# Patient Record
Sex: Female | Born: 1935
Health system: Southern US, Community
[De-identification: ages and names within clinical notes are randomized; demographics above are authoritative.]

## PROBLEM LIST (undated history)

## (undated) DIAGNOSIS — F0280 Dementia in other diseases classified elsewhere without behavioral disturbance: Secondary | ICD-10-CM

## (undated) DIAGNOSIS — E785 Hyperlipidemia, unspecified: Secondary | ICD-10-CM

## (undated) DIAGNOSIS — I82409 Acute embolism and thrombosis of unspecified deep veins of unspecified lower extremity: Secondary | ICD-10-CM

## (undated) DIAGNOSIS — I639 Cerebral infarction, unspecified: Secondary | ICD-10-CM

## (undated) DIAGNOSIS — G309 Alzheimer's disease, unspecified: Secondary | ICD-10-CM

## (undated) DIAGNOSIS — I10 Essential (primary) hypertension: Secondary | ICD-10-CM

## (undated) DIAGNOSIS — I2699 Other pulmonary embolism without acute cor pulmonale: Secondary | ICD-10-CM

## (undated) DIAGNOSIS — I4891 Unspecified atrial fibrillation: Secondary | ICD-10-CM

## (undated) DIAGNOSIS — Z950 Presence of cardiac pacemaker: Secondary | ICD-10-CM

## (undated) DIAGNOSIS — N189 Chronic kidney disease, unspecified: Secondary | ICD-10-CM

## (undated) HISTORY — DX: Hyperlipidemia, unspecified: E78.5

## (undated) HISTORY — DX: Other pulmonary embolism without acute cor pulmonale: I26.99

## (undated) HISTORY — DX: Alzheimer's disease, unspecified: G30.9

## (undated) HISTORY — DX: Essential (primary) hypertension: I10

## (undated) HISTORY — PX: CATARACT EXTRACTION, BILATERAL: SHX1313

## (undated) HISTORY — DX: Dementia in other diseases classified elsewhere, unspecified severity, without behavioral disturbance, psychotic disturbance, mood disturbance, and anxiety: F02.80

## (undated) HISTORY — DX: Presence of cardiac pacemaker: Z95.0

## (undated) HISTORY — DX: Cerebral infarction, unspecified: I63.9

## (undated) HISTORY — DX: Chronic kidney disease, unspecified: N18.9

## (undated) HISTORY — DX: Unspecified atrial fibrillation: I48.91

---
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
## 2011-01-19 DIAGNOSIS — I82409 Acute embolism and thrombosis of unspecified deep veins of unspecified lower extremity: Secondary | ICD-10-CM

## 2011-01-19 HISTORY — DX: Acute embolism and thrombosis of unspecified deep veins of unspecified lower extremity: I82.409

## 2011-01-19 HISTORY — PX: PACEMAKER IMPLANT: EP1218

## 2017-05-26 DIAGNOSIS — F0280 Dementia in other diseases classified elsewhere without behavioral disturbance: Secondary | ICD-10-CM | POA: Diagnosis not present

## 2017-05-26 DIAGNOSIS — E782 Mixed hyperlipidemia: Secondary | ICD-10-CM | POA: Diagnosis not present

## 2017-05-26 DIAGNOSIS — G301 Alzheimer's disease with late onset: Secondary | ICD-10-CM | POA: Diagnosis not present

## 2017-05-26 DIAGNOSIS — Z7901 Long term (current) use of anticoagulants: Secondary | ICD-10-CM | POA: Diagnosis not present

## 2017-06-01 ENCOUNTER — Emergency Department (HOSPITAL_COMMUNITY)
Admission: EM | Admit: 2017-06-01 | Discharge: 2017-06-01 | Disposition: A | Discharge status: Home / Self Care | Payer: Medicare Other | Attending: Emergency Medicine | Admitting: Emergency Medicine

## 2017-06-01 ENCOUNTER — Emergency Department (HOSPITAL_COMMUNITY): Payer: Medicare Other

## 2017-06-01 ENCOUNTER — Encounter (HOSPITAL_COMMUNITY): Payer: Self-pay | Admitting: Emergency Medicine

## 2017-06-01 DIAGNOSIS — Z79899 Other long term (current) drug therapy: Secondary | ICD-10-CM | POA: Diagnosis not present

## 2017-06-01 DIAGNOSIS — K5900 Constipation, unspecified: Secondary | ICD-10-CM | POA: Diagnosis not present

## 2017-06-01 DIAGNOSIS — F0390 Unspecified dementia without behavioral disturbance: Secondary | ICD-10-CM | POA: Diagnosis not present

## 2017-06-01 DIAGNOSIS — N19 Unspecified kidney failure: Secondary | ICD-10-CM | POA: Diagnosis not present

## 2017-06-01 DIAGNOSIS — R1084 Generalized abdominal pain: Secondary | ICD-10-CM

## 2017-06-01 DIAGNOSIS — Z7901 Long term (current) use of anticoagulants: Secondary | ICD-10-CM | POA: Insufficient documentation

## 2017-06-01 LAB — COMPREHENSIVE METABOLIC PANEL
ALBUMIN: 4.2 g/dL (ref 3.5–5.0)
ALT: 17 U/L (ref 14–54)
AST: 23 U/L (ref 15–41)
Alkaline Phosphatase: 54 U/L (ref 38–126)
Anion gap: 9 (ref 5–15)
BILIRUBIN TOTAL: 0.6 mg/dL (ref 0.3–1.2)
BUN: 22 mg/dL — ABNORMAL HIGH (ref 6–20)
CO2: 25 mmol/L (ref 22–32)
Calcium: 9.9 mg/dL (ref 8.9–10.3)
Chloride: 106 mmol/L (ref 101–111)
Creatinine, Ser: 2.3 mg/dL — ABNORMAL HIGH (ref 0.44–1.00)
GFR calc Af Amer: 21 mL/min — ABNORMAL LOW (ref >60)
GFR calc non Af Amer: 18 mL/min — ABNORMAL LOW (ref >60)
GLUCOSE: 103 mg/dL — AB (ref 65–99)
Potassium: 4.7 mmol/L (ref 3.5–5.1)
Sodium: 140 mmol/L (ref 135–145)
TOTAL PROTEIN: 8.1 g/dL (ref 6.5–8.1)

## 2017-06-01 LAB — CBC
HCT: 40.1 % (ref 36.0–46.0)
HEMOGLOBIN: 13.5 g/dL (ref 12.0–15.0)
MCH: 30.8 pg (ref 26.0–34.0)
MCHC: 33.7 g/dL (ref 30.0–36.0)
MCV: 91.3 fL (ref 78.0–100.0)
Platelets: 266 10*3/uL (ref 150–400)
RBC: 4.39 MIL/uL (ref 3.87–5.11)
RDW: 15 % (ref 11.5–15.5)
WBC: 4.5 10*3/uL (ref 4.0–10.5)

## 2017-06-01 LAB — URINALYSIS, ROUTINE W REFLEX MICROSCOPIC
BILIRUBIN URINE: NEGATIVE
Glucose, UA: NEGATIVE mg/dL
HGB URINE DIPSTICK: NEGATIVE
Ketones, ur: NEGATIVE mg/dL
LEUKOCYTES UA: NEGATIVE
NITRITE: NEGATIVE
Protein, ur: NEGATIVE mg/dL
SPECIFIC GRAVITY, URINE: 1.011 (ref 1.005–1.030)
pH: 5 (ref 5.0–8.0)

## 2017-06-01 LAB — LIPASE, BLOOD: Lipase: 72 U/L — ABNORMAL HIGH (ref 11–51)

## 2017-06-01 MED ORDER — SODIUM CHLORIDE 0.9 % IV SOLN
INTRAVENOUS | Status: DC
  Administered 2017-06-01: 15:00:00 via INTRAVENOUS

## 2017-06-01 NOTE — ED Notes (Signed)
Patient c/o of abdominal pain for about a week now. Patient denies n/v and diarrhea. Patient has seen her primary care provider who thinks she is constipation and has done a rectal examine and did not see any stool.

## 2017-06-01 NOTE — Discharge Instructions (Addendum)
Extensive work-up here today shows some evidence of some mild constipation.  Continue the MiraLAX.  Can take it twice a day as needed.  Labs provided from today's ED visit.  Call and make an appointment to follow-up with your primary care doctor at [facility].  Also call and make an appointment to follow-up with nephrology.  Today's work-up showed that you have evidence of renal failure whether this is new or old is not known.  Will need close follow-up for this.  No evidence of elevated potassium or fluid overload that would require admission today.  But close follow-up is necessary.

## 2017-06-01 NOTE — ED Notes (Signed)
Took patient to the restroom using a steady device. Patient was able to have a small hard bowel movement but unable to void yet. Patient is now receiving fluids IV and will try again in 20 min.

## 2017-06-01 NOTE — ED Notes (Signed)
Patient aware we need urine sample. Patient has already urinated not too long ago stated by patients family. Patient will let us know when she needs to void. Patient is 1 assist or can use steady to the restroom.

## 2017-06-01 NOTE — ED Provider Notes (Signed)
[facility] DEPT Provider Note   CSN: [id number] Arrival date & time: 06/01/17  1308     History   Chief Complaint Chief Complaint  Patient presents with  . Constipation  . Abdominal Pain    HPI Hailey Lopez is a 84 y.o. female.  Patient sent in from primary care office.  Patient recently moved here with family members from [place].  Patient followed by [facility] physicians had a visit about a week ago concerns were clinically for constipation patient started on MiraLAX and some other medication for it.  Patient return for follow-up of that today.  Still having abdominal pain so referred in for CT abdomen to make sure that there was nothing significant going on.  Patient has not had any labs since she has been here in [place].  Patient known to have a history of dementia.  Family denies fevers or nausea or vomiting.  Patient just complains of crampy abdominal pain.  Appetite is been good.     History reviewed. No pertinent past medical history.  There are no active problems to display for this patient.   History reviewed. No pertinent surgical history.   OB History   None      Home Medications    Prior to Admission medications   Medication Sig Start Date End Date Taking? Authorizing Provider  atorvastatin (LIPITOR) 10 MG tablet Take 10 mg by mouth daily.   Yes [provider]  calcitRIOL (ROCALTROL) 0.5 MCG capsule Take 0.5 mcg by mouth daily.   Yes [provider]  docusate sodium (COLACE) 100 MG capsule Take 100 mg by mouth daily.   Yes [provider]  donepezil (ARICEPT ODT) 5 MG disintegrating tablet Take 5 mg by mouth at bedtime.   Yes [provider]  isosorbide dinitrate (ISORDIL) 5 MG tablet Take 5 mg by mouth daily with lunch.   Yes [provider]  MELATONIN PO Take 2 mLs by mouth at bedtime.   Yes [provider]  polyethylene glycol (MIRALAX / GLYCOLAX) packet Take 17 g  by mouth daily.   Yes [provider]  sennosides-docusate sodium (SENOKOT-S) 8.6-50 MG tablet Take 1 tablet by mouth at bedtime.   Yes [provider]  warfarin (COUMADIN) 3 MG tablet Take 3 mg by mouth daily.   Yes [provider]    Family History No family history on file.  Social History Social History   Tobacco Use  . Smoking status: Never Smoker  . Smokeless tobacco: Never Used  Substance Use Topics  . Alcohol use: Not on file  . Drug use: Not on file     Allergies   Patient has no known allergies.   Review of Systems Review of Systems  Unable to perform ROS: Dementia     Physical Exam Updated Vital Signs BP 136/82 (BP Location: Left Arm)   Pulse 60   Temp 97.6 F (36.4 C) (Oral)   Resp 19   SpO2 100%   Physical Exam  Constitutional: She appears well-developed and well-nourished. No distress.  HENT:  Head: Normocephalic and atraumatic.  Eyes: Pupils are equal, round, and reactive to light. Conjunctivae and EOM are normal.  Neck: Neck supple.  Cardiovascular: Normal rate, regular rhythm and normal heart sounds.  Pulmonary/Chest: Effort normal and breath sounds normal. No respiratory distress.  Abdominal: Soft. Bowel sounds are normal. She exhibits distension. There is no tenderness.  Musculoskeletal: Normal range of motion.  Neurological: She is alert. No  cranial nerve deficit. She exhibits normal muscle tone.  Skin: Skin is warm.  Nursing note and vitals reviewed.    ED Treatments / Results  Labs (all labs ordered are listed, but only abnormal results are displayed) Labs Reviewed  LIPASE, BLOOD - Abnormal; Notable for the following components:      Result Value   Lipase 72 (*)    All other components within normal limits  COMPREHENSIVE METABOLIC PANEL - Abnormal; Notable for the following components:   Glucose, Bld 103 (*)    BUN 22 (*)    Creatinine, Ser 2.30 (*)    GFR calc non Af Amer 18 (*)    GFR calc Af Amer  21 (*)    All other components within normal limits  CBC  URINALYSIS, ROUTINE W REFLEX MICROSCOPIC    EKG None  Radiology No results found.  Procedures Procedures (including critical care time)  Medications Ordered in ED Medications  0.9 %  sodium chloride infusion (has no administration in time range)     Initial Impression / Assessment and Plan / ED Course  I have reviewed the triage vital signs and the nursing notes.  Pertinent labs & imaging results that were available during my care of the patient were reviewed by me and considered in my medical decision making (see chart for details).    Patient work-up here CT scan of the abdomen shows mild to moderate constipation no significant abdominal findings.  Patient can continue MiraLAX for this.  Lab work-up shows evidence of significant renal failure but a normal potassium no evidence of any volume overload room air sats are in the upper 90s and CT scan cut to the lower part of the chest shows no significant volume overload.  Is unknown the chronicity of this.  Since patient has not had any labs in our system or since she is been moved here since [place].  Patient will need close follow-up with primary care doctor to recheck labs also patient given referral to nephrology.  For the constipation recommend only continuing the MiraLAX can take it twice a day.   Final Clinical Impressions(s) / ED Diagnoses   Final diagnoses:  None    ED Discharge Orders    None       Fredia Sorrow, MD 06/01/17 1736

## 2017-06-01 NOTE — ED Triage Notes (Signed)
Family reports that she has been having issues with constipation and PCP put her on new medications along with Miralax but still not helping. Patient c/o abd pains.

## 2017-06-10 DIAGNOSIS — N184 Chronic kidney disease, stage 4 (severe): Secondary | ICD-10-CM | POA: Diagnosis not present

## 2017-06-10 DIAGNOSIS — Z86711 Personal history of pulmonary embolism: Secondary | ICD-10-CM | POA: Diagnosis not present

## 2017-06-10 DIAGNOSIS — Z8673 Personal history of transient ischemic attack (TIA), and cerebral infarction without residual deficits: Secondary | ICD-10-CM | POA: Diagnosis not present

## 2017-06-10 DIAGNOSIS — K5901 Slow transit constipation: Secondary | ICD-10-CM | POA: Diagnosis not present

## 2017-07-08 DIAGNOSIS — R309 Painful micturition, unspecified: Secondary | ICD-10-CM | POA: Diagnosis not present

## 2017-07-08 DIAGNOSIS — I10 Essential (primary) hypertension: Secondary | ICD-10-CM | POA: Diagnosis not present

## 2017-07-08 DIAGNOSIS — Z7901 Long term (current) use of anticoagulants: Secondary | ICD-10-CM | POA: Diagnosis not present

## 2017-07-08 DIAGNOSIS — F0280 Dementia in other diseases classified elsewhere without behavioral disturbance: Secondary | ICD-10-CM | POA: Diagnosis not present

## 2017-07-25 DIAGNOSIS — I129 Hypertensive chronic kidney disease with stage 1 through stage 4 chronic kidney disease, or unspecified chronic kidney disease: Secondary | ICD-10-CM | POA: Diagnosis not present

## 2017-07-25 DIAGNOSIS — N184 Chronic kidney disease, stage 4 (severe): Secondary | ICD-10-CM | POA: Diagnosis not present

## 2017-07-25 DIAGNOSIS — Z86711 Personal history of pulmonary embolism: Secondary | ICD-10-CM | POA: Diagnosis not present

## 2017-08-25 DIAGNOSIS — Z7901 Long term (current) use of anticoagulants: Secondary | ICD-10-CM | POA: Diagnosis not present

## 2017-09-13 DIAGNOSIS — R1032 Left lower quadrant pain: Secondary | ICD-10-CM | POA: Diagnosis not present

## 2017-09-23 ENCOUNTER — Encounter: Payer: Self-pay | Admitting: *Deleted

## 2017-09-26 DIAGNOSIS — Z7901 Long term (current) use of anticoagulants: Secondary | ICD-10-CM | POA: Diagnosis not present

## 2017-09-28 DIAGNOSIS — Z23 Encounter for immunization: Secondary | ICD-10-CM | POA: Diagnosis not present

## 2017-09-28 DIAGNOSIS — R1032 Left lower quadrant pain: Secondary | ICD-10-CM | POA: Diagnosis not present

## 2017-09-28 DIAGNOSIS — Z0000 Encounter for general adult medical examination without abnormal findings: Secondary | ICD-10-CM | POA: Diagnosis not present

## 2017-09-28 DIAGNOSIS — Z7901 Long term (current) use of anticoagulants: Secondary | ICD-10-CM | POA: Diagnosis not present

## 2017-09-29 ENCOUNTER — Encounter: Payer: Self-pay | Admitting: Cardiovascular Disease

## 2017-09-29 ENCOUNTER — Ambulatory Visit (INDEPENDENT_AMBULATORY_CARE_PROVIDER_SITE_OTHER): Payer: Medicare Other | Admitting: Cardiovascular Disease

## 2017-09-29 VITALS — BP 110/62 | HR 60 | Ht 63.0 in | Wt 153.0 lb

## 2017-09-29 DIAGNOSIS — I10 Essential (primary) hypertension: Secondary | ICD-10-CM

## 2017-09-29 DIAGNOSIS — E785 Hyperlipidemia, unspecified: Secondary | ICD-10-CM

## 2017-09-29 DIAGNOSIS — I495 Sick sinus syndrome: Secondary | ICD-10-CM

## 2017-09-29 NOTE — Progress Notes (Signed)
Cardiology Office Note   Date:  09/29/2017   ID:  Hailey, Lopez Nov 30, 1929, MRN [id number]  PCP:  Dineen Kid, MD  Cardiologist:   Skeet Latch, MD   No chief complaint on file.     History of Present Illness: Hailey Lopez is a 84 y.o. female with hypertension, hyperlipidemia, prior stroke, CKD IV, dementia, PPM here to establish care.  Hailey Lopez moved to [facility] from [place], [place].  She established care with Dr. Lennette Bihari Via 06/2017 and was referred to cardiology to establish care.  She was initially supposed to move with her sister and her husband.  However they are both also elderly and were unable to care for her.  Therefore she now lives with her niece who is working on becoming her legal guardian.  [name] has been feeling well.  Her niece encourages her to try and walk at least a little bit each day.  She is mostly limited by instability in her left knee that sometimes gives out.  She has no exertional dyspnea or chest pain.  She has no lower extremity edema but sometimes feels short of breath when lying down.  She reportedly had a Environmental health practitioner pacemaker implanted in 2013.  She thinks that the pacemaker was implanted for "fainting spells."  When she first moved here they plugged up her transmitter to the wall but did not realize there is no phone jack in that room and it has not been transmitting.  Little is known about her prior cardiac history as neither she nor her niece know her prior cardiologist name or address.  She denies any history of heart failure or CAD.  Hailey Lopez has been on warfarin but there is no clear explanation as to why.  She has no history of DVT or atrial fibrillation that we know of.  Her PCP has been managing her warfarin and reportedly got some records from her prior caregivers.   Past Medical History:  Diagnosis Date  . Alzheimer's disease   . Chronic kidney disease    Stage 4  . CVA (cerebrovascular accident) ([place])   . Hyperlipidemia   .  Hypertension   . Pacemaker   . Pulmonary embolism ([place])     History reviewed. No pertinent surgical history.   Current Outpatient Medications  Medication Sig Dispense Refill  . atorvastatin (LIPITOR) 10 MG tablet Take 10 mg by mouth daily.    . calcitRIOL (ROCALTROL) 0.5 MCG capsule Take 0.5 mcg by mouth daily.    Marland Kitchen donepezil (ARICEPT ODT) 5 MG disintegrating tablet Take 5 mg by mouth at bedtime.    . isosorbide dinitrate (ISORDIL) 5 MG tablet Take 5 mg by mouth daily with lunch.    . polyethylene glycol (MIRALAX / GLYCOLAX) packet Take 17 g by mouth daily.    Marland Kitchen warfarin (COUMADIN) 3 MG tablet Take 3 mg by mouth daily.     No current facility-administered medications for this visit.     Allergies:   Patient has no known allergies.    Social History:  The patient  reports that she has quit smoking. She has never used smokeless tobacco. She reports that she drank alcohol. She reports that she does not use drugs.   Family History:  The patient's family history includes Dementia in her mother and sister; Diabetes in her brother; Heart attack in her brother, brother, father, and sister; Hyperlipidemia in her sister; Hypertension in her brother; Stroke in her brother and father.  ROS:  Please see the history of present illness.   Otherwise, review of systems are positive for none.   All other systems are reviewed and negative.    PHYSICAL EXAM: VS:  BP 110/62   Pulse 60   Ht 5\' 3"  (1.6 m)   Wt 153 lb (69.4 kg)   SpO2 97%   BMI 27.10 kg/m  , BMI Body mass index is 27.1 kg/m. GENERAL:  Well appearing.  Able to answer simple questions.  HEENT:  Pupils equal round and reactive, fundi not visualized, oral mucosa unremarkable NECK:  No jugular venous distention, waveform within normal limits, carotid upstroke brisk and symmetric, no bruits LUNGS:  Clear to auscultation bilaterally HEART:  RRR.  PMI not displaced or sustained,S1 and S2 within normal limits, no S3, no S4, no clicks,  no rubs, no murmurs ABD:  Flat, positive bowel sounds normal in frequency in pitch, no bruits, no rebound, no guarding, no midline pulsatile mass, no hepatomegaly, no splenomegaly EXT:  2 plus pulses throughout, no edema, no cyanosis no clubbing SKIN:  No rashes no nodules NEURO:  Cranial nerves II through XII grossly intact, motor grossly intact throughout PSYCH: Oriented to person   EKG:  EKG is not ordered today. The ekg ordered today demonstrates APVS.  Rate 60 bpm.  RBBB.     Recent Labs: 06/01/2017: ALT 17; BUN 22; Creatinine, Ser 2.30; Hemoglobin 13.5; Platelets 266; Potassium 4.7; Sodium 140   09/13/2017:  Sodium 139, potassium 4.2, BUN 41, creatinine 2.39 AST 17, ALT 13 WBC 5.6, hemoglobin 13.1, hematocrit 39.3, platelets 248  Lipid Panel No results found for: CHOL, TRIG, HDL, CHOLHDL, VLDL, LDLCALC, LDLDIRECT    Wt Readings from Last 3 Encounters:  09/29/17 153 lb (69.4 kg)      ASSESSMENT AND PLAN:  # PPM: St. Jude PPM presumably implanted for SSS.  Functioning properly on EKG.  We will refer her to Dr. Sallyanne Kuster for management.  # Hypertension: BP well-controlled.  Continue Isordil.  # Hyperlipidemia: Continue atorvastatin. She will return for fasting lipids.    # Chronic anticoagulation: Her indication is unclear.  Her PCP reportedly has records from her cardiologist.  We will try to obtain these records.     Current medicines are reviewed at length with the patient today.  The patient does not have concerns regarding medicines.  The following changes have been made:  no change  Labs/ tests ordered today include:   Orders Placed This Encounter  Procedures  . Lipid panel  . EKG 12-Lead     Disposition:   FU with Belinda Bringhurst C. Oval Linsey, MD, [facility] in 6 months.  If records are obtained and she only has SSS s/p PPM without any other active cardiac issues it may make sense for her to just see Dr. Loletha Grayer.      Signed, Shanen Norris C. Oval Linsey, MD, [facility]  09/29/2017 4:57  PM    [place]

## 2017-09-29 NOTE — Patient Instructions (Addendum)
Medication Instructions:  Your physician recommends that you continue on your current medications as directed. Please refer to the Current Medication list given to you today.  Labwork: FASTING LIPID TOMORROW WITH YOUR PRIMARY CARE   Testing/Procedures: NONE  Follow-Up: Your physician recommends that you schedule a follow-up appointment in: DR CROITORU FOR PACEMAKER  FOLLOW UP AS NEEDED WITH DR [facility]   If you need a refill on your cardiac medications before your next appointment, please call your pharmacy.

## 2017-09-30 ENCOUNTER — Other Ambulatory Visit: Payer: Self-pay | Admitting: Family Medicine

## 2017-09-30 DIAGNOSIS — Z7901 Long term (current) use of anticoagulants: Secondary | ICD-10-CM | POA: Diagnosis not present

## 2017-09-30 DIAGNOSIS — E782 Mixed hyperlipidemia: Secondary | ICD-10-CM | POA: Diagnosis not present

## 2017-09-30 DIAGNOSIS — R748 Abnormal levels of other serum enzymes: Secondary | ICD-10-CM

## 2017-09-30 DIAGNOSIS — R1032 Left lower quadrant pain: Secondary | ICD-10-CM

## 2017-10-07 ENCOUNTER — Ambulatory Visit
Admission: RE | Admit: 2017-10-07 | Discharge: 2017-10-07 | Disposition: A | Discharge status: Home / Self Care | Payer: Medicare Other | Source: Ambulatory Visit | Attending: Family Medicine | Admitting: Family Medicine

## 2017-10-07 DIAGNOSIS — K5730 Diverticulosis of large intestine without perforation or abscess without bleeding: Secondary | ICD-10-CM | POA: Diagnosis not present

## 2017-10-07 DIAGNOSIS — R1032 Left lower quadrant pain: Secondary | ICD-10-CM

## 2017-10-07 DIAGNOSIS — R748 Abnormal levels of other serum enzymes: Secondary | ICD-10-CM

## 2017-10-25 DIAGNOSIS — Z7901 Long term (current) use of anticoagulants: Secondary | ICD-10-CM | POA: Diagnosis not present

## 2017-11-22 DIAGNOSIS — Z7901 Long term (current) use of anticoagulants: Secondary | ICD-10-CM | POA: Diagnosis not present

## 2017-11-23 ENCOUNTER — Ambulatory Visit (INDEPENDENT_AMBULATORY_CARE_PROVIDER_SITE_OTHER): Payer: Medicare Other | Admitting: Cardiovascular Disease

## 2017-11-23 ENCOUNTER — Encounter: Payer: Self-pay | Admitting: Cardiovascular Disease

## 2017-11-23 VITALS — BP 126/72 | HR 69 | Ht 63.0 in | Wt 153.4 lb

## 2017-11-23 DIAGNOSIS — N184 Chronic kidney disease, stage 4 (severe): Secondary | ICD-10-CM | POA: Diagnosis not present

## 2017-11-23 DIAGNOSIS — I480 Paroxysmal atrial fibrillation: Secondary | ICD-10-CM

## 2017-11-23 DIAGNOSIS — E7800 Pure hypercholesterolemia, unspecified: Secondary | ICD-10-CM

## 2017-11-23 DIAGNOSIS — I10 Essential (primary) hypertension: Secondary | ICD-10-CM | POA: Diagnosis not present

## 2017-11-23 DIAGNOSIS — I495 Sick sinus syndrome: Secondary | ICD-10-CM | POA: Diagnosis not present

## 2017-11-23 DIAGNOSIS — Z950 Presence of cardiac pacemaker: Secondary | ICD-10-CM | POA: Diagnosis not present

## 2017-11-23 DIAGNOSIS — Z86711 Personal history of pulmonary embolism: Secondary | ICD-10-CM | POA: Diagnosis not present

## 2017-11-23 NOTE — Progress Notes (Signed)
Cardiology Office Note:    Date:  11/25/2017   ID:  Hailey Lopez, DOB Jan 11, 1930, MRN [id number]  PCP:  Hailey Kid, MD  Cardiologist:  Joseangel Nettleton Electrophysiologist:  None   Referring MD: Hailey Kid, MD   Chief Complaint  Patient presents with  . Pacemaker Check    History of Present Illness:    Hailey Lopez is a 84 y.o. female with a hx of sick sinus syndrome and paroxysmal atrial fibrillation status post dual-chamber permanent pacemaker Hydrologist DR RF implanted 2013 in [place]), recently established cardiology follow-up in [place].  She moved here to live with her niece Hailey Lopez, after developing worsening dementia.  She has a previous history of stroke and pulmonary embolism although the details are uncertain.  She has advanced stage chronic kidney disease, hypertension hypercholesterolemia.  She saw Dr. Oval Lopez recently, but is here to establish pacemaker follow-up.  Pacemaker interrogation shows normal device function.  Estimated generator longevity is 6.8-7.0 years.  She is not device dependent.  Lead parameters are all excellent.  She has 75% atrial pacing and only 21% ventricular pacing.  She had several episodes of atrial fibrillation but they all occurred in the first 2 weeks of October 2019, with the remainder of the year being completely free of meaningful arrhythmia.  The longest episode lasted for about 6 hours but the burden is only less than 1%.  Her niece states that the arrhythmia occurred during the period of time when the patient had a temporary caregiver and was more restless.  No pacemaker programming changes are necessary.  She is on chronic anticoagulation with warfarin and has not had recent falls, injuries or serious bleeding.  Past Medical History:  Diagnosis Date  . Alzheimer's disease ([place])   . Chronic kidney disease    Stage 4  . CVA (cerebrovascular accident) ([place])   . Hyperlipidemia   . Hypertension   . Pacemaker   .  Pulmonary embolism ([place])     History reviewed. No pertinent surgical history.  Current Medications: Current Meds  Medication Sig  . atorvastatin (LIPITOR) 10 MG tablet Take 10 mg by mouth daily.  . calcitRIOL (ROCALTROL) 0.5 MCG capsule Take 0.5 mcg by mouth daily.  Marland Kitchen donepezil (ARICEPT ODT) 5 MG disintegrating tablet Take 5 mg by mouth at bedtime.  . isosorbide dinitrate (ISORDIL) 5 MG tablet Take 5 mg by mouth daily with lunch.  . polyethylene glycol (MIRALAX / GLYCOLAX) packet Take 17 g by mouth daily.  Marland Kitchen warfarin (COUMADIN) 3 MG tablet Take 3 mg by mouth daily.     Allergies:   Patient has no known allergies.   Social History   Socioeconomic History  . Marital status: Widowed    Spouse name: Not on file  . Number of children: Not on file  . Years of education: Not on file  . Highest education level: Not on file  Occupational History  . Not on file  Social Needs  . Financial resource strain: Not on file  . Food insecurity:    Worry: Not on file    Inability: Not on file  . Transportation needs:    Medical: Not on file    Non-medical: Not on file  Tobacco Use  . Smoking status: Former Research scientist (life sciences)  . Smokeless tobacco: Never Used  Substance and Sexual Activity  . Alcohol use: Not Currently  . Drug use: Never  . Sexual activity: Not on file  Lifestyle  . Physical activity:    Days  per week: Not on file    Minutes per session: Not on file  . Stress: Not on file  Relationships  . Social connections:    Talks on phone: Not on file    Gets together: Not on file    Attends religious service: Not on file    Active member of club or organization: Not on file    Attends meetings of clubs or organizations: Not on file    Relationship status: Not on file  Other Topics Concern  . Not on file  Social History Narrative  . Not on file     Family History: The patient's family history includes Dementia in her mother and sister; Diabetes in her brother; Heart attack in her  brother, brother, father, and sister; Hyperlipidemia in her sister; Hypertension in her brother; Stroke in her brother and father.  ROS:   Please see the history of present illness.     All other systems reviewed and are negative.  EKGs/Labs/Other Studies Reviewed:    The following studies were reviewed today: Notes from Dr. Oval Lopez  EKG:  EKG is not ordered today.    Recent Labs: 06/01/2017: ALT 17; BUN 22; Creatinine, Ser 2.30; Hemoglobin 13.5; Platelets 266; Potassium 4.7; Sodium 140  Recent Lipid Panel No results found for: CHOL, TRIG, HDL, CHOLHDL, VLDL, LDLCALC, LDLDIRECT  Physical Exam:    VS:  BP 126/72   Pulse 69   Ht 5\' 3"  (1.6 m)   Wt 153 lb 6.4 oz (69.6 kg)   BMI 27.17 kg/m     Wt Readings from Last 3 Encounters:  11/23/17 153 lb 6.4 oz (69.6 kg)  09/29/17 153 lb (69.4 kg)     GEN: She is very quiet.  Well nourished, well developed in no acute distress HEENT: Normal NECK: No JVD; No carotid bruits LYMPHATICS: No lymphadenopathy CARDIAC: RRR, no murmurs, rubs, gallops.  Well-healed left subclavian pacemaker site RESPIRATORY:  Clear to auscultation without rales, wheezing or rhonchi  ABDOMEN: Soft, non-tender, non-distended MUSCULOSKELETAL:  No edema; No deformity  SKIN: Warm and dry NEUROLOGIC:  Alert and oriented x 3 PSYCHIATRIC: Very quiet and a little apprehensive.  Speaks in single word sentences.  Did smile a little bit towards the end of our appointment.  ASSESSMENT:    1. SSS (sick sinus syndrome) (H[facility])   2. Paroxysmal atrial fibrillation ([place])   3. Pacemaker   4. History of pulmonary embolism   5. CKD (chronic kidney disease) stage 4, GFR 15-29 ml/min (H[facility])   6. Essential hypertension   7. Hypercholesterolemia    PLAN:    In order of problems listed above:  1. SSS: Not device dependent but requires roughly 75% atrial pacing.  Considering her level of activity the heart rate histograms appear to be appropriate. 2. AFib: The overall burden  of arrhythmia is very low.  Rate control was satisfactory.  The episodes were probably triggered by anxiety and were not symptomatic.  She is fully anticoagulated. 3. PPM: We will enroll in our remote pacemaker follow-up clinic.  Downloads every 3 months. 4. History of pulmonary embolism: On warfarin for this as well. 5. CKD 4: Reportedly stable parameters of renal function. 6. HTN: Well-controlled 7. HLP: On atorvastatin.  She is due to have a repeat lipid profile   Medication Adjustments/Labs and Tests Ordered: Current medicines are reviewed at length with the patient today.  Concerns regarding medicines are outlined above.  Orders Placed This Encounter  Procedures  . Pacemaker external -  parameters   No orders of the defined types were placed in this encounter.   Patient Instructions  Medication Instructions:  Dr Sallyanne Kuster recommends that you continue on your current medications as directed. Please refer to the Current Medication list given to you today.  If you need a refill on your cardiac medications before your next appointment, please call your pharmacy.   Testing/Procedures: Remote monitoring is used to monitor your Pacemaker of ICD from home. This monitoring reduces the number of office visits required to check your device to one time per year. It allows [place] to keep an eye on the functioning of your device to ensure it is working properly. You are scheduled for a device check from home on Wednesday, Februaury 5th, 2020. You may send your transmission at any time that day. If you have a wireless device, the transmission will be sent automatically. After your physician reviews your transmission, you will receive a postcard with your next transmission date.  Follow-Up: At [facility], you and your health needs are our priority.  As part of our continuing mission to provide you with exceptional heart care, we have created designated Provider Care Teams.  These Care Teams include  your primary Cardiologist (physician) and Advanced Practice Providers (APPs -  Physician Assistants and Nurse Practitioners) who all work together to provide you with the care you need, when you need it. You will need a follow up appointment in 12 months.  Please call our office 2 months in advance to schedule this appointment.  You may see Sanda Klein, MD or one of the following Advanced Practice Providers on your designated Care Team: [place], [place] . Fabian Sharp, PA-C    Signed, Sanda Klein, MD  11/25/2017 4:00 PM    [place]

## 2017-11-23 NOTE — Patient Instructions (Signed)
Medication Instructions:  Dr Sallyanne Kuster recommends that you continue on your current medications as directed. Please refer to the Current Medication list given to you today.  If you need a refill on your cardiac medications before your next appointment, please call your pharmacy.   Testing/Procedures: Remote monitoring is used to monitor your Pacemaker of ICD from home. This monitoring reduces the number of office visits required to check your device to one time per year. It allows [place] to keep an eye on the functioning of your device to ensure it is working properly. You are scheduled for a device check from home on Wednesday, Februaury 5th, 2020. You may send your transmission at any time that day. If you have a wireless device, the transmission will be sent automatically. After your physician reviews your transmission, you will receive a postcard with your next transmission date.  Follow-Up: At [facility], you and your health needs are our priority.  As part of our continuing mission to provide you with exceptional heart care, we have created designated Provider Care Teams.  These Care Teams include your primary Cardiologist (physician) and Advanced Practice Providers (APPs -  Physician Assistants and Nurse Practitioners) who all work together to provide you with the care you need, when you need it. You will need a follow up appointment in 12 months.  Please call our office 2 months in advance to schedule this appointment.  You may see Sanda Klein, MD or one of the following Advanced Practice Providers on your designated Care Team: [place], [place] . Fabian Sharp, PA-C

## 2017-11-25 ENCOUNTER — Ambulatory Visit (INDEPENDENT_AMBULATORY_CARE_PROVIDER_SITE_OTHER): Payer: Medicare Other | Admitting: Podiatry

## 2017-11-25 ENCOUNTER — Encounter: Payer: Self-pay | Admitting: Cardiovascular Disease

## 2017-11-25 DIAGNOSIS — I480 Paroxysmal atrial fibrillation: Secondary | ICD-10-CM | POA: Insufficient documentation

## 2017-11-25 DIAGNOSIS — M79674 Pain in right toe(s): Secondary | ICD-10-CM

## 2017-11-25 DIAGNOSIS — B351 Tinea unguium: Secondary | ICD-10-CM | POA: Diagnosis not present

## 2017-11-25 DIAGNOSIS — N184 Chronic kidney disease, stage 4 (severe): Secondary | ICD-10-CM | POA: Insufficient documentation

## 2017-11-25 DIAGNOSIS — Z86711 Personal history of pulmonary embolism: Secondary | ICD-10-CM | POA: Insufficient documentation

## 2017-11-25 DIAGNOSIS — M79675 Pain in left toe(s): Secondary | ICD-10-CM | POA: Diagnosis not present

## 2017-11-25 DIAGNOSIS — Z950 Presence of cardiac pacemaker: Secondary | ICD-10-CM | POA: Insufficient documentation

## 2017-11-25 DIAGNOSIS — E7800 Pure hypercholesterolemia, unspecified: Secondary | ICD-10-CM | POA: Insufficient documentation

## 2017-11-25 DIAGNOSIS — I10 Essential (primary) hypertension: Secondary | ICD-10-CM | POA: Insufficient documentation

## 2017-11-25 DIAGNOSIS — I495 Sick sinus syndrome: Secondary | ICD-10-CM | POA: Insufficient documentation

## 2017-11-25 NOTE — Patient Instructions (Signed)

## 2017-12-06 DIAGNOSIS — Z7901 Long term (current) use of anticoagulants: Secondary | ICD-10-CM | POA: Diagnosis not present

## 2017-12-06 LAB — CUP PACEART INCLINIC DEVICE CHECK
Battery Remaining Longevity: 84 mo
Brady Statistic RV Percent Paced: 21 %
Date Time Interrogation Session: 20191106165906
Implantable Lead Implant Date: 20130205
Implantable Lead Location: 753859
Lead Channel Impedance Value: 337.5 Ohm
Lead Channel Impedance Value: 412.5 Ohm
Lead Channel Pacing Threshold Amplitude: 1 V
Lead Channel Pacing Threshold Pulse Width: 0.4 ms
Lead Channel Pacing Threshold Pulse Width: 0.4 ms
Lead Channel Sensing Intrinsic Amplitude: 7 mV
Lead Channel Setting Pacing Amplitude: 1.125
Lead Channel Setting Pacing Amplitude: 1.625
Lead Channel Setting Sensing Sensitivity: 2 mV
MDC IDC LEAD IMPLANT DT: 20130205
MDC IDC LEAD LOCATION: 753860
MDC IDC MSMT BATTERY VOLTAGE: 2.9 V
MDC IDC MSMT LEADCHNL RA PACING THRESHOLD AMPLITUDE: 0.5 V
MDC IDC MSMT LEADCHNL RA PACING THRESHOLD AMPLITUDE: 0.5 V
MDC IDC MSMT LEADCHNL RA PACING THRESHOLD PULSEWIDTH: 0.4 ms
MDC IDC MSMT LEADCHNL RA SENSING INTR AMPL: 2.3 mV
MDC IDC MSMT LEADCHNL RV PACING THRESHOLD AMPLITUDE: 1 V
MDC IDC MSMT LEADCHNL RV PACING THRESHOLD PULSEWIDTH: 0.4 ms
MDC IDC PG IMPLANT DT: 20130205
MDC IDC SET LEADCHNL RV PACING PULSEWIDTH: 0.4 ms
MDC IDC STAT BRADY RA PERCENT PACED: 75 %
Pulse Gen Model: 2210
Pulse Gen Serial Number: 7317766

## 2017-12-12 ENCOUNTER — Encounter: Payer: Self-pay | Admitting: Podiatry

## 2017-12-12 NOTE — Progress Notes (Signed)
Subjective: Hailey Lopez presents today accompanied by her niece, [name],  with cc painful, thick toenails 1-5 b/l that she cannot cut and which interfere with daily activities.  Pain is aggravated when wearing enclosed shoe gear.  Patient has dementia and is very sensitive in regards to her feet making it difficult to trim per Niece.  Past Medical History:  Diagnosis Date  . Alzheimer's disease ([place])   . Chronic kidney disease    Stage 4  . CVA (cerebrovascular accident) ([place])   . Hyperlipidemia   . Hypertension   . Pacemaker   . Pulmonary embolism [facility])    Patient Active Problem List   Diagnosis Date Noted  . SSS (sick sinus syndrome) ([place]) 11/25/2017  . Paroxysmal atrial fibrillation ([place]) 11/25/2017  . Pacemaker 11/25/2017  . History of pulmonary embolism 11/25/2017  . CKD (chronic kidney disease) stage 4, GFR 15-29 ml/min (HCC) 11/25/2017  . Essential hypertension 11/25/2017  . Hypercholesterolemia 11/25/2017   History reviewed. No pertinent surgical history.    Current Outpatient Medications:  .  atorvastatin (LIPITOR) 10 MG tablet, Take 10 mg by mouth daily., Disp: , Rfl:  .  calcitRIOL (ROCALTROL) 0.5 MCG capsule, Take 0.5 mcg by mouth daily., Disp: , Rfl:  .  donepezil (ARICEPT ODT) 5 MG disintegrating tablet, Take 5 mg by mouth at bedtime., Disp: , Rfl:  .  isosorbide dinitrate (ISORDIL) 5 MG tablet, Take 5 mg by mouth daily with lunch., Disp: , Rfl:  .  polyethylene glycol (MIRALAX / GLYCOLAX) packet, Take 17 g by mouth daily., Disp: , Rfl:  .  warfarin (COUMADIN) 3 MG tablet, Take 3 mg by mouth daily., Disp: , Rfl:   No Known Allergies  Social History   Occupational History  . Not on file  Tobacco Use  . Smoking status: Former Research scientist (life sciences)  . Smokeless tobacco: Never Used  Substance and Sexual Activity  . Alcohol use: Not Currently  . Drug use: Never  . Sexual activity: Not on file   Family History  Problem Relation Age of Onset  . Dementia Mother    . Heart attack Father   . Stroke Father   . Heart attack Sister   . Heart attack Brother   . Heart attack Brother   . Stroke Brother   . Hypertension Brother   . Diabetes Brother   . Hyperlipidemia Sister   . Dementia Sister    ROS limited due to dementia Neurological: memory problems Skin: +changes in toenails  Objective: Vascular Examination: Capillary refill time <3 seconds x 10 digits Dorsalis pedis 1/4 b/l Posterior tibial pulses 1/4 b/l  Digital hair x 10 digits absent b/l Skin temperature gradient warm to cool b/l  Dermatological Examination: Skin thin, shiny and atrophic b/l  Toenails 1-5 b/l discolored, thick, dystrophic with subungual debris and pain with palpation to nailbeds due to thickness of nails.Incurvated nailplate lateral border right great toe. No erythema, no edema, no drainage. +Fungal debris in border.  Musculoskeletal: Muscle strength 5/5 to all LE muscle groups  Neurological: Sensation intact with 10 gram monofilament. Vibratory sensation intact.  Assessment: Painful onychomycosis toenails 1-5 b/l   Plan: 1. Toenails 1-5 b/l were debrided in length and girth without iatrogenic bleeding. Offending nail border right great toe debrided and curretaged. Border cleansed with alcohol. Triple antibiotic ointment applied. Niece instructed to apply triple antibiotic ointment to border once daily for one week.  2. Patient to continue soft, supportive shoe gear 3. Patient to report any pedal injuries  to medical professional immediately. 4. Follow up 3 months. Patient/POA to call should there be a concern in the interim.

## 2018-01-02 DIAGNOSIS — Z7901 Long term (current) use of anticoagulants: Secondary | ICD-10-CM | POA: Diagnosis not present

## 2018-01-30 DIAGNOSIS — Z7901 Long term (current) use of anticoagulants: Secondary | ICD-10-CM | POA: Diagnosis not present

## 2018-02-06 DIAGNOSIS — I480 Paroxysmal atrial fibrillation: Secondary | ICD-10-CM | POA: Diagnosis not present

## 2018-02-06 DIAGNOSIS — N184 Chronic kidney disease, stage 4 (severe): Secondary | ICD-10-CM | POA: Diagnosis not present

## 2018-02-06 DIAGNOSIS — Z86711 Personal history of pulmonary embolism: Secondary | ICD-10-CM | POA: Diagnosis not present

## 2018-02-06 DIAGNOSIS — I129 Hypertensive chronic kidney disease with stage 1 through stage 4 chronic kidney disease, or unspecified chronic kidney disease: Secondary | ICD-10-CM | POA: Diagnosis not present

## 2018-02-06 DIAGNOSIS — Z7901 Long term (current) use of anticoagulants: Secondary | ICD-10-CM | POA: Diagnosis not present

## 2018-02-22 ENCOUNTER — Ambulatory Visit (INDEPENDENT_AMBULATORY_CARE_PROVIDER_SITE_OTHER): Payer: Medicare Other

## 2018-02-22 DIAGNOSIS — I495 Sick sinus syndrome: Secondary | ICD-10-CM

## 2018-02-24 ENCOUNTER — Ambulatory Visit: Payer: Medicare Other | Admitting: Podiatry

## 2018-02-26 LAB — CUP PACEART REMOTE DEVICE CHECK
Battery Remaining Longevity: 80 mo
Battery Remaining Percentage: 65 %
Brady Statistic AP VP Percent: 34 %
Brady Statistic AP VS Percent: 42 %
Brady Statistic AS VP Percent: 1 %
Brady Statistic AS VS Percent: 23 %
Brady Statistic RV Percent Paced: 34 %
Implantable Lead Implant Date: 20130205
Implantable Lead Location: 753859
Lead Channel Impedance Value: 300 Ohm
Lead Channel Impedance Value: 390 Ohm
Lead Channel Pacing Threshold Amplitude: 0.625 V
Lead Channel Pacing Threshold Amplitude: 0.75 V
Lead Channel Pacing Threshold Pulse Width: 0.4 ms
Lead Channel Setting Pacing Amplitude: 1 V
Lead Channel Setting Pacing Pulse Width: 0.4 ms
Lead Channel Setting Sensing Sensitivity: 2 mV
MDC IDC LEAD IMPLANT DT: 20130205
MDC IDC LEAD LOCATION: 753860
MDC IDC MSMT BATTERY VOLTAGE: 2.9 V
MDC IDC MSMT LEADCHNL RA PACING THRESHOLD PULSEWIDTH: 0.4 ms
MDC IDC MSMT LEADCHNL RA SENSING INTR AMPL: 1.6 mV
MDC IDC MSMT LEADCHNL RV SENSING INTR AMPL: 5.1 mV
MDC IDC PG IMPLANT DT: 20130205
MDC IDC SESS DTM: 20200205071734
MDC IDC SET LEADCHNL RA PACING AMPLITUDE: 1.625
MDC IDC STAT BRADY RA PERCENT PACED: 75 %
Pulse Gen Model: 2210
Pulse Gen Serial Number: 7317766

## 2018-03-03 ENCOUNTER — Ambulatory Visit (INDEPENDENT_AMBULATORY_CARE_PROVIDER_SITE_OTHER): Payer: Medicare Other | Admitting: Podiatry

## 2018-03-03 ENCOUNTER — Encounter: Payer: Self-pay | Admitting: Podiatry

## 2018-03-03 DIAGNOSIS — M79675 Pain in left toe(s): Secondary | ICD-10-CM

## 2018-03-03 DIAGNOSIS — B351 Tinea unguium: Secondary | ICD-10-CM | POA: Diagnosis not present

## 2018-03-03 DIAGNOSIS — M79674 Pain in right toe(s): Secondary | ICD-10-CM | POA: Diagnosis not present

## 2018-03-03 NOTE — Progress Notes (Signed)
Remote pacemaker transmission.   

## 2018-03-03 NOTE — Patient Instructions (Signed)

## 2018-03-06 ENCOUNTER — Ambulatory Visit: Payer: Medicare Other | Admitting: Podiatry

## 2018-03-06 DIAGNOSIS — I10 Essential (primary) hypertension: Secondary | ICD-10-CM | POA: Diagnosis not present

## 2018-03-06 DIAGNOSIS — Z7901 Long term (current) use of anticoagulants: Secondary | ICD-10-CM | POA: Diagnosis not present

## 2018-03-06 DIAGNOSIS — Z950 Presence of cardiac pacemaker: Secondary | ICD-10-CM | POA: Diagnosis not present

## 2018-03-06 DIAGNOSIS — I480 Paroxysmal atrial fibrillation: Secondary | ICD-10-CM | POA: Diagnosis not present

## 2018-03-06 NOTE — Progress Notes (Signed)
Subjective: Hailey Lopez presents today, accompanied by her niece,  with painful, thick toenails 1-5 b/l that she cannot cut and which interfere with daily activities.  Pain is aggravated when wearing enclosed shoe gear.  She is also on Coumadin.   Via, Lennette Bihari, MD is her PCP.    Current Outpatient Medications:  .  atorvastatin (LIPITOR) 10 MG tablet, Take 10 mg by mouth daily., Disp: , Rfl:  .  calcitRIOL (ROCALTROL) 0.5 MCG capsule, Take 0.5 mcg by mouth daily., Disp: , Rfl:  .  donepezil (ARICEPT ODT) 5 MG disintegrating tablet, Take 5 mg by mouth at bedtime., Disp: , Rfl:  .  hyoscyamine (LEVSIN, ANASPAZ) 0.125 MG tablet, TAKE 1 TABLET BY MOUTH 4 TIMES DAILY AS NEEDED, Disp: , Rfl: 0 .  isosorbide dinitrate (ISORDIL) 5 MG tablet, Take 5 mg by mouth daily with lunch., Disp: , Rfl:  .  polyethylene glycol (MIRALAX / GLYCOLAX) packet, Take 17 g by mouth daily., Disp: , Rfl:  .  warfarin (COUMADIN) 3 MG tablet, Take 3 mg by mouth daily., Disp: , Rfl:   No Known Allergies  Objective:  Vascular Examination: Capillary refill time <3 seconds  x 10 digits  Dorsalis pedis 1/4 b/l  Posterior tibial pulses 1/4 b/l  Digital hair absent x 10 digits  Skin temperature gradient warm to cool b/l  Dermatological Examination: Skin with normal turgor, texture and tone b/l  Toenails 1-5 b/l discolored, thick, dystrophic with subungual debris and pain with palpation to nailbeds due to thickness of nails.  Musculoskeletal: Muscle strength 5/5 to all LE muscle groups  No gross bony deformities b/l.  No pain, crepitus or joint limitation noted with ROM.   Neurological: Sensation intact with 10 gram monofilament.  Vibratory sensation intact.  Assessment: Painful onychomycosis toenails 1-5 b/l   Plan: 1. Toenails 1-5 b/l were debrided in length and girth without iatrogenic bleeding. 2. Patient to continue soft, supportive shoe gear 3. Patient to report any pedal injuries to medical  professional immediately. 4. Follow up 3 months.  5. Patient/POA to call should there be a concern in the interim.

## 2018-04-03 DIAGNOSIS — Z7901 Long term (current) use of anticoagulants: Secondary | ICD-10-CM | POA: Diagnosis not present

## 2018-05-31 ENCOUNTER — Other Ambulatory Visit: Payer: Self-pay

## 2018-05-31 ENCOUNTER — Ambulatory Visit (INDEPENDENT_AMBULATORY_CARE_PROVIDER_SITE_OTHER): Payer: Medicare Other | Admitting: *Deleted

## 2018-05-31 DIAGNOSIS — I495 Sick sinus syndrome: Secondary | ICD-10-CM

## 2018-05-31 DIAGNOSIS — I480 Paroxysmal atrial fibrillation: Secondary | ICD-10-CM

## 2018-06-01 ENCOUNTER — Telehealth: Payer: Self-pay

## 2018-06-01 NOTE — Telephone Encounter (Signed)
Spoke with patient to remind of missed remote transmission 

## 2018-06-02 ENCOUNTER — Ambulatory Visit: Payer: Medicare Other | Admitting: Podiatry

## 2018-06-02 LAB — CUP PACEART REMOTE DEVICE CHECK
Date Time Interrogation Session: 20200515195526
Implantable Lead Implant Date: 20130205
Implantable Lead Implant Date: 20130205
Implantable Lead Location: 753859
Implantable Lead Location: 753860
Implantable Pulse Generator Implant Date: 20130205
Pulse Gen Model: 2210
Pulse Gen Serial Number: 7317766

## 2018-06-19 NOTE — Progress Notes (Signed)
Remote pacemaker transmission.   

## 2018-06-29 DIAGNOSIS — Z7901 Long term (current) use of anticoagulants: Secondary | ICD-10-CM | POA: Diagnosis not present

## 2018-07-11 DIAGNOSIS — I480 Paroxysmal atrial fibrillation: Secondary | ICD-10-CM | POA: Diagnosis not present

## 2018-07-11 DIAGNOSIS — I10 Essential (primary) hypertension: Secondary | ICD-10-CM | POA: Diagnosis not present

## 2018-07-11 DIAGNOSIS — Z950 Presence of cardiac pacemaker: Secondary | ICD-10-CM | POA: Diagnosis not present

## 2018-07-11 DIAGNOSIS — Z7901 Long term (current) use of anticoagulants: Secondary | ICD-10-CM | POA: Diagnosis not present

## 2018-07-18 DIAGNOSIS — Z8673 Personal history of transient ischemic attack (TIA), and cerebral infarction without residual deficits: Secondary | ICD-10-CM | POA: Diagnosis not present

## 2018-07-18 DIAGNOSIS — Z7901 Long term (current) use of anticoagulants: Secondary | ICD-10-CM | POA: Diagnosis not present

## 2018-07-18 DIAGNOSIS — I480 Paroxysmal atrial fibrillation: Secondary | ICD-10-CM | POA: Diagnosis not present

## 2018-07-24 ENCOUNTER — Other Ambulatory Visit (INDEPENDENT_AMBULATORY_CARE_PROVIDER_SITE_OTHER): Payer: Medicare Other

## 2018-07-24 ENCOUNTER — Telehealth: Payer: Self-pay

## 2018-07-24 DIAGNOSIS — I480 Paroxysmal atrial fibrillation: Secondary | ICD-10-CM

## 2018-07-24 DIAGNOSIS — I10 Essential (primary) hypertension: Secondary | ICD-10-CM

## 2018-07-24 NOTE — Telephone Encounter (Signed)

## 2018-07-31 ENCOUNTER — Other Ambulatory Visit: Payer: Self-pay

## 2018-07-31 ENCOUNTER — Ambulatory Visit (INDEPENDENT_AMBULATORY_CARE_PROVIDER_SITE_OTHER): Payer: Medicare Other | Admitting: *Deleted

## 2018-07-31 DIAGNOSIS — I480 Paroxysmal atrial fibrillation: Secondary | ICD-10-CM

## 2018-07-31 DIAGNOSIS — Z7901 Long term (current) use of anticoagulants: Secondary | ICD-10-CM | POA: Diagnosis not present

## 2018-07-31 LAB — POCT INR: INR: 2.5 (ref 2.0–3.0)

## 2018-07-31 NOTE — Patient Instructions (Signed)
Description   Continue taking 3mg  daily. Recheck in 2 weeks.

## 2018-08-09 ENCOUNTER — Telehealth: Payer: Self-pay

## 2018-08-09 NOTE — Telephone Encounter (Signed)
LMOM FOR PRESCREEN  

## 2018-08-11 ENCOUNTER — Encounter: Payer: Self-pay | Admitting: Podiatry

## 2018-08-11 ENCOUNTER — Ambulatory Visit (INDEPENDENT_AMBULATORY_CARE_PROVIDER_SITE_OTHER): Payer: Medicare Other | Admitting: Podiatry

## 2018-08-11 ENCOUNTER — Ambulatory Visit (INDEPENDENT_AMBULATORY_CARE_PROVIDER_SITE_OTHER): Payer: Medicare Other | Admitting: *Deleted

## 2018-08-11 ENCOUNTER — Other Ambulatory Visit: Payer: Self-pay

## 2018-08-11 DIAGNOSIS — M79675 Pain in left toe(s): Secondary | ICD-10-CM | POA: Diagnosis not present

## 2018-08-11 DIAGNOSIS — I480 Paroxysmal atrial fibrillation: Secondary | ICD-10-CM

## 2018-08-11 DIAGNOSIS — B351 Tinea unguium: Secondary | ICD-10-CM | POA: Diagnosis not present

## 2018-08-11 DIAGNOSIS — M79674 Pain in right toe(s): Secondary | ICD-10-CM | POA: Diagnosis not present

## 2018-08-11 DIAGNOSIS — Z7901 Long term (current) use of anticoagulants: Secondary | ICD-10-CM | POA: Diagnosis not present

## 2018-08-11 LAB — POCT INR: INR: 2.1 (ref 2.0–3.0)

## 2018-08-11 NOTE — Patient Instructions (Signed)
Description   Continue taking 3mg  daily. Recheck in 4 weeks. Call us with any medication changes or concern- Coumadin clinic # [phone], Main # [phone].

## 2018-08-11 NOTE — Patient Instructions (Signed)

## 2018-08-14 NOTE — Progress Notes (Signed)
Subjective: Shaianne Nucci presents today with painful, thick toenails 1-5 b/l that she cannot cut and which interfere with daily activities.  Pain is aggravated when wearing enclosed shoe gear.  Ms. Lastra has advanced dementia and is a poor historian.  Her Niece is her guardian and is present during the visit.  Via, Lennette Bihari, MD is her PCP.   Current Outpatient Medications:  .  atorvastatin (LIPITOR) 10 MG tablet, Take 10 mg by mouth daily., Disp: , Rfl:  .  calcitRIOL (ROCALTROL) 0.5 MCG capsule, Take 0.5 mcg by mouth daily., Disp: , Rfl:  .  donepezil (ARICEPT ODT) 5 MG disintegrating tablet, Take 5 mg by mouth at bedtime., Disp: , Rfl:  .  hyoscyamine (LEVSIN, ANASPAZ) 0.125 MG tablet, TAKE 1 TABLET BY MOUTH 4 TIMES DAILY AS NEEDED, Disp: , Rfl: 0 .  isosorbide dinitrate (ISORDIL) 5 MG tablet, Take 5 mg by mouth daily with lunch., Disp: , Rfl:  .  polyethylene glycol (MIRALAX / GLYCOLAX) packet, Take 17 g by mouth daily., Disp: , Rfl:  .  warfarin (COUMADIN) 3 MG tablet, Take 3 mg by mouth daily., Disp: , Rfl:   No Known Allergies  Objective: Vascular Examination: Capillary refill time <3 seconds x 10 digits.  Dorsalis pedis 1/4 b/l.  Posterior tibial pulses 1/4 b/l.  Digital hair absent x 10 digits.  Skin temperature gradient warm to cool b/l.  Dermatological Examination: Skin with normal turgor, texture and tone b/l.  Toenails 1-5 b/l discolored, thick, dystrophic with subungual debris and pain with palpation to nailbeds due to thickness of nails.  Musculoskeletal: Muscle strength 5/5 to all LE muscle groups  No gross bony deformities b/l.  No pain, crepitus or joint limitation noted with ROM.   Neurological: Sensation intact with 10 gram monofilament.  Vibratory sensation intact.  Assessment: Painful onychomycosis toenails 1-5 b/l   Plan: 1. Toenails 1-5 b/l were debrided in length and girth without iatrogenic bleeding. 2. Patient to continue soft,  supportive shoe gear daily. 3. Patient to report any pedal injuries to medical professional immediately. 4. Follow up 3 months.  5. Patient/POA to call should there be a concern in the interim.

## 2018-08-23 DIAGNOSIS — N184 Chronic kidney disease, stage 4 (severe): Secondary | ICD-10-CM | POA: Diagnosis not present

## 2018-08-23 DIAGNOSIS — Z86711 Personal history of pulmonary embolism: Secondary | ICD-10-CM | POA: Diagnosis not present

## 2018-08-23 DIAGNOSIS — I129 Hypertensive chronic kidney disease with stage 1 through stage 4 chronic kidney disease, or unspecified chronic kidney disease: Secondary | ICD-10-CM | POA: Diagnosis not present

## 2018-08-30 ENCOUNTER — Ambulatory Visit (INDEPENDENT_AMBULATORY_CARE_PROVIDER_SITE_OTHER): Payer: Medicare Other | Admitting: *Deleted

## 2018-08-30 DIAGNOSIS — I495 Sick sinus syndrome: Secondary | ICD-10-CM | POA: Diagnosis not present

## 2018-08-30 LAB — CUP PACEART REMOTE DEVICE CHECK
Battery Remaining Longevity: 72 mo
Battery Remaining Percentage: 57 %
Battery Voltage: 2.89 V
Brady Statistic AP VP Percent: 31 %
Brady Statistic AP VS Percent: 46 %
Brady Statistic AS VP Percent: 1 %
Brady Statistic AS VS Percent: 22 %
Brady Statistic RA Percent Paced: 76 %
Brady Statistic RV Percent Paced: 31 %
Date Time Interrogation Session: 20200812063123
Implantable Lead Implant Date: 20130205
Implantable Lead Implant Date: 20130205
Implantable Lead Location: 753859
Implantable Lead Location: 753860
Implantable Pulse Generator Implant Date: 20130205
Lead Channel Impedance Value: 330 Ohm
Lead Channel Impedance Value: 410 Ohm
Lead Channel Pacing Threshold Amplitude: 0.625 V
Lead Channel Pacing Threshold Amplitude: 0.875 V
Lead Channel Pacing Threshold Pulse Width: 0.4 ms
Lead Channel Pacing Threshold Pulse Width: 0.4 ms
Lead Channel Sensing Intrinsic Amplitude: 2.1 mV
Lead Channel Sensing Intrinsic Amplitude: 6.9 mV
Lead Channel Setting Pacing Amplitude: 1.125
Lead Channel Setting Pacing Amplitude: 1.625
Lead Channel Setting Pacing Pulse Width: 0.4 ms
Lead Channel Setting Sensing Sensitivity: 2 mV
Pulse Gen Model: 2210
Pulse Gen Serial Number: 7317766

## 2018-09-08 ENCOUNTER — Encounter: Payer: Self-pay | Admitting: Cardiology

## 2018-09-08 NOTE — Progress Notes (Signed)
Remote pacemaker transmission.   

## 2018-09-11 ENCOUNTER — Ambulatory Visit (INDEPENDENT_AMBULATORY_CARE_PROVIDER_SITE_OTHER): Payer: Medicare Other | Admitting: Pharmacist Clinician (PhC)/ Clinical Pharmacy Specialist

## 2018-09-11 ENCOUNTER — Other Ambulatory Visit: Payer: Self-pay

## 2018-09-11 DIAGNOSIS — Z7901 Long term (current) use of anticoagulants: Secondary | ICD-10-CM | POA: Diagnosis not present

## 2018-09-11 DIAGNOSIS — I480 Paroxysmal atrial fibrillation: Secondary | ICD-10-CM | POA: Diagnosis not present

## 2018-09-11 LAB — POCT INR: INR: 2 (ref 2.0–3.0)

## 2018-09-19 ENCOUNTER — Emergency Department (HOSPITAL_BASED_OUTPATIENT_CLINIC_OR_DEPARTMENT_OTHER): Payer: Medicare Other

## 2018-09-19 ENCOUNTER — Other Ambulatory Visit: Payer: Self-pay

## 2018-09-19 ENCOUNTER — Encounter (HOSPITAL_BASED_OUTPATIENT_CLINIC_OR_DEPARTMENT_OTHER): Payer: Self-pay | Admitting: *Deleted

## 2018-09-19 ENCOUNTER — Emergency Department (HOSPITAL_BASED_OUTPATIENT_CLINIC_OR_DEPARTMENT_OTHER)
Admission: EM | Admit: 2018-09-19 | Discharge: 2018-09-19 | Disposition: A | Discharge status: Home / Self Care | Payer: Medicare Other | Attending: Emergency Medicine | Admitting: Emergency Medicine

## 2018-09-19 DIAGNOSIS — Y939 Activity, unspecified: Secondary | ICD-10-CM | POA: Insufficient documentation

## 2018-09-19 DIAGNOSIS — Z87891 Personal history of nicotine dependence: Secondary | ICD-10-CM | POA: Insufficient documentation

## 2018-09-19 DIAGNOSIS — Z950 Presence of cardiac pacemaker: Secondary | ICD-10-CM | POA: Diagnosis not present

## 2018-09-19 DIAGNOSIS — S0083XA Contusion of other part of head, initial encounter: Secondary | ICD-10-CM | POA: Diagnosis not present

## 2018-09-19 DIAGNOSIS — W19XXXA Unspecified fall, initial encounter: Secondary | ICD-10-CM | POA: Diagnosis not present

## 2018-09-19 DIAGNOSIS — I129 Hypertensive chronic kidney disease with stage 1 through stage 4 chronic kidney disease, or unspecified chronic kidney disease: Secondary | ICD-10-CM | POA: Diagnosis not present

## 2018-09-19 DIAGNOSIS — Y929 Unspecified place or not applicable: Secondary | ICD-10-CM | POA: Insufficient documentation

## 2018-09-19 DIAGNOSIS — G309 Alzheimer's disease, unspecified: Secondary | ICD-10-CM | POA: Diagnosis not present

## 2018-09-19 DIAGNOSIS — S0990XA Unspecified injury of head, initial encounter: Secondary | ICD-10-CM | POA: Diagnosis not present

## 2018-09-19 DIAGNOSIS — W1830XA Fall on same level, unspecified, initial encounter: Secondary | ICD-10-CM | POA: Insufficient documentation

## 2018-09-19 DIAGNOSIS — S0003XA Contusion of scalp, initial encounter: Secondary | ICD-10-CM | POA: Diagnosis not present

## 2018-09-19 DIAGNOSIS — Z7901 Long term (current) use of anticoagulants: Secondary | ICD-10-CM | POA: Insufficient documentation

## 2018-09-19 DIAGNOSIS — N184 Chronic kidney disease, stage 4 (severe): Secondary | ICD-10-CM | POA: Diagnosis not present

## 2018-09-19 DIAGNOSIS — R51 Headache: Secondary | ICD-10-CM | POA: Diagnosis not present

## 2018-09-19 DIAGNOSIS — Z79899 Other long term (current) drug therapy: Secondary | ICD-10-CM | POA: Insufficient documentation

## 2018-09-19 DIAGNOSIS — Y999 Unspecified external cause status: Secondary | ICD-10-CM | POA: Diagnosis not present

## 2018-09-19 NOTE — ED Provider Notes (Signed)
[facility] EMERGENCY DEPARTMENT Provider Note   CSN: [id number] Arrival date & time: 09/19/18  1808     History   Chief Complaint Chief Complaint  Patient presents with  . Fall    HPI Hailey Lopez is a 85 y.o. female.     85 yo F with a chief complaint of a fall.  Patient is demented and minimally verbal at baseline.  She was left alone for about a minute and when they returned they found her face down on the ground.  It was assumed that she try to get up to follow them when they left the room.  She is currently back to her baseline.  Has a large hematoma to the right frontal area.  No other injuries were noted.  Brought to their family doctor's office who sent her here for CT scan.  She is on Coumadin.  The history is provided by a caregiver.  Fall This is a new problem. The current episode started 6 to 12 hours ago. The problem occurs rarely. The problem has been resolved. Pertinent negatives include no chest pain, no abdominal pain, no headaches and no shortness of breath. Nothing aggravates the symptoms. Nothing relieves the symptoms. She has tried nothing for the symptoms. The treatment provided no relief.    Past Medical History:  Diagnosis Date  . Alzheimer's disease ([place])   . Chronic kidney disease    Stage 4  . CVA (cerebrovascular accident) ([place])   . Hyperlipidemia   . Hypertension   . Pacemaker   . Pulmonary embolism [facility])     Patient Active Problem List   Diagnosis Date Noted  . Long term (current) use of anticoagulants 07/31/2018  . SSS (sick sinus syndrome) ([place]) 11/25/2017  . Paroxysmal atrial fibrillation ([place]) 11/25/2017  . Pacemaker 11/25/2017  . History of pulmonary embolism 11/25/2017  . CKD (chronic kidney disease) stage 4, GFR 15-29 ml/min (HCC) 11/25/2017  . Essential hypertension 11/25/2017  . Hypercholesterolemia 11/25/2017    History reviewed. No pertinent surgical history.   OB History   No obstetric history on file.       Home Medications    Prior to Admission medications   Medication Sig Start Date End Date Taking? Authorizing Provider  atorvastatin (LIPITOR) 10 MG tablet Take 10 mg by mouth daily.    [provider]  calcitRIOL (ROCALTROL) 0.5 MCG capsule Take 0.5 mcg by mouth daily.    [provider]  donepezil (ARICEPT ODT) 5 MG disintegrating tablet Take 5 mg by mouth at bedtime.    [provider]  hyoscyamine (LEVSIN, ANASPAZ) 0.125 MG tablet TAKE 1 TABLET BY MOUTH 4 TIMES DAILY AS NEEDED 09/13/17   [provider]  isosorbide dinitrate (ISORDIL) 5 MG tablet Take 5 mg by mouth daily with lunch.    [provider]  polyethylene glycol (MIRALAX / GLYCOLAX) packet Take 17 g by mouth daily.    [provider]  warfarin (COUMADIN) 3 MG tablet Take 3 mg by mouth daily.    [provider]    Family History Family History  Problem Relation Age of Onset  . Dementia Mother   . Heart attack Father   . Stroke Father   . Heart attack Sister   . Heart attack Brother   . Heart attack Brother   . Stroke Brother   . Hypertension Brother   . Diabetes Brother   . Hyperlipidemia Sister   . Dementia Sister     Social  History Social History   Tobacco Use  . Smoking status: Former Research scientist (life sciences)  . Smokeless tobacco: Never Used  Substance Use Topics  . Alcohol use: Not Currently  . Drug use: Never     Allergies   Patient has no known allergies.   Review of Systems Review of Systems  Unable to perform ROS: Dementia  Respiratory: Negative for shortness of breath.   Cardiovascular: Negative for chest pain.  Gastrointestinal: Negative for abdominal pain.  Neurological: Negative for headaches.     Physical Exam Updated Vital Signs BP (!) 149/102   Pulse 70   Temp 98.3 F (36.8 C)   Resp 18   Ht 5\' 3"  (1.6 m)   Wt 66.7 kg   SpO2 98%   BMI 26.04 kg/m   Physical Exam Vitals signs and nursing note reviewed.  Constitutional:       General: She is not in acute distress.    Appearance: She is well-developed. She is not diaphoretic.  HENT:     Head: Normocephalic.     Comments: Large right frontal hematoma.  Palpated from head to toe without any other noted areas of pain. Eyes:     Pupils: Pupils are equal, round, and reactive to light.  Neck:     Musculoskeletal: Normal range of motion and neck supple.  Cardiovascular:     Rate and Rhythm: Normal rate and regular rhythm.     Heart sounds: No murmur. No friction rub. No gallop.   Pulmonary:     Effort: Pulmonary effort is normal.     Breath sounds: No wheezing or rales.  Abdominal:     General: There is no distension.     Palpations: Abdomen is soft.     Tenderness: There is no abdominal tenderness.  Musculoskeletal:        General: No tenderness.  Skin:    General: Skin is warm and dry.  Neurological:     Mental Status: She is alert and oriented to person, place, and time.  Psychiatric:        Behavior: Behavior normal.      ED Treatments / Results  Labs (all labs ordered are listed, but only abnormal results are displayed) Labs Reviewed - No data to display  EKG None  Radiology Ct Head Wo Contrast  Result Date: 09/19/2018 CLINICAL DATA:  Head trauma, headache. Fall from standing striking head on floor. Episode of vomiting. Frontal hematoma. EXAM: CT HEAD WITHOUT CONTRAST TECHNIQUE: Contiguous axial images were obtained from the base of the skull through the vertex without intravenous contrast. COMPARISON:  None. FINDINGS: Brain: No intracranial hemorrhage, mass effect, or midline shift. Age related atrophy. Moderate chronic small vessel ischemia. No hydrocephalus. The basilar cisterns are patent. No evidence of territorial infarct or acute ischemia. No extra-axial or intracranial fluid collection. Vascular: Atherosclerosis of skullbase vasculature without hyperdense vessel or abnormal calcification. Skull: No fracture or focal lesion. Sinuses/Orbits:  Paranasal sinuses and mastoid air cells are clear. The visualized orbits are unremarkable. Other: Frontal scalp hematoma just to the right of midline. IMPRESSION: Frontal scalp hematoma. No acute intracranial abnormality or skull fracture. Electronically Signed   By: Keith Rake M.D.   On: 09/19/2018 19:04    Procedures Procedures (including critical care time)  Medications Ordered in ED Medications - No data to display   Initial Impression / Assessment and Plan / ED Course  I have reviewed the triage vital signs and the nursing notes.  Pertinent labs & imaging results that  were available during my care of the patient were reviewed by me and considered in my medical decision making (see chart for details).        85 yo F with a significant past medical history of severe dementia who comes in with a chief complaints of a fall.  It was unwitnessed it was expected that she fell out of a chair as she tried to get up to follow her caregiver.  She is currently back at her baseline.  CT of the head is negative for intracranial hemorrhage.  Discharge home.  7:53 PM:  I have discussed the diagnosis/risks/treatment options with the caregiver and believe the pt to be eligible for discharge home to follow-up with PCP. We also discussed returning to the ED immediately if new or worsening sx occur. We discussed the sx which are most concerning (e.g., change in mental status, persistent vomiting.) that necessitate immediate return. Medications administered to the patient during their visit and any new prescriptions provided to the patient are listed below.  Medications given during this visit Medications - No data to display   The patient appears reasonably screen and/or stabilized for discharge and I doubt any other medical condition or other [facility] requiring further screening, evaluation, or treatment in the ED at this time prior to discharge.    Final Clinical Impressions(s) / ED Diagnoses    Final diagnoses:  Scalp hematoma, initial encounter    ED Discharge Orders    None       Deno Etienne, DO 09/19/18 1953

## 2018-09-19 NOTE — ED Notes (Signed)
Ice applied to forhead

## 2018-09-19 NOTE — ED Triage Notes (Signed)
Caregiver states fall from standing hitting head on tile floor x 3 hrs ago , vomited x 1 episode , denies LOC

## 2018-09-19 NOTE — ED Notes (Signed)
Patient transported to CT 

## 2018-09-19 NOTE — Discharge Instructions (Signed)
Please return for any change in her mental status or if she has persistent vomiting.  Otherwise follow-up with her family doctor.  She can take Tylenol up to thousand milligrams 4 times a day for pain.

## 2018-10-17 DIAGNOSIS — Z7901 Long term (current) use of anticoagulants: Secondary | ICD-10-CM | POA: Diagnosis not present

## 2018-10-17 DIAGNOSIS — Z0000 Encounter for general adult medical examination without abnormal findings: Secondary | ICD-10-CM | POA: Diagnosis not present

## 2018-10-17 DIAGNOSIS — I480 Paroxysmal atrial fibrillation: Secondary | ICD-10-CM | POA: Diagnosis not present

## 2018-10-17 DIAGNOSIS — Z8673 Personal history of transient ischemic attack (TIA), and cerebral infarction without residual deficits: Secondary | ICD-10-CM | POA: Diagnosis not present

## 2018-10-23 ENCOUNTER — Ambulatory Visit (INDEPENDENT_AMBULATORY_CARE_PROVIDER_SITE_OTHER): Payer: Medicare Other | Admitting: Pharmacist

## 2018-10-23 ENCOUNTER — Other Ambulatory Visit: Payer: Self-pay

## 2018-10-23 DIAGNOSIS — Z7901 Long term (current) use of anticoagulants: Secondary | ICD-10-CM | POA: Diagnosis not present

## 2018-10-23 DIAGNOSIS — I480 Paroxysmal atrial fibrillation: Secondary | ICD-10-CM | POA: Diagnosis not present

## 2018-10-23 LAB — POCT INR: INR: 1.8 — AB (ref 2.0–3.0)

## 2018-10-25 ENCOUNTER — Ambulatory Visit: Payer: Medicare Other | Admitting: Diagnostic Neuroimaging

## 2018-10-25 ENCOUNTER — Encounter: Payer: Self-pay | Admitting: *Deleted

## 2018-10-25 ENCOUNTER — Other Ambulatory Visit: Payer: Self-pay

## 2018-10-25 VITALS — BP 121/70 | HR 85 | Temp 96.9°F | Ht 63.0 in | Wt 149.0 lb

## 2018-10-25 DIAGNOSIS — F0390 Unspecified dementia without behavioral disturbance: Secondary | ICD-10-CM

## 2018-10-25 DIAGNOSIS — F03C0 Unspecified dementia, severe, without behavioral disturbance, psychotic disturbance, mood disturbance, and anxiety: Secondary | ICD-10-CM

## 2018-10-25 NOTE — Patient Instructions (Signed)
  SEVERE DEMENTIA - safety / supervision issues reviewed - caregiver resources provided

## 2018-10-25 NOTE — Progress Notes (Signed)
[facility]  PATIENT: Hailey Lopez DOB: 1929-11-20  REFERRING CLINICIAN: C Wharton HISTORY FROM: patient's niece and chart REASON FOR VISIT: new consult    HISTORICAL  CHIEF COMPLAINT:  Chief Complaint  Patient presents with   Dementia    rm 6 New Pt, niece/caregiver- Hailey Lopez, unable to do MMSE    HISTORY OF PRESENT ILLNESS:   85 year old female here for evaluation of severe dementia.  Patient previously lived in [place] and moved to [place] in 2019.  Patient already was diagnosed with severe dementia at that time, moved here to live with her niece.  Language, cognitive function, physical abilities are severely affected.  Patient is able to walk short distances with help.  She is able to fold small towels and do some small puzzles at times.  Sometimes she says a few words but essentially does not speak.  She is able to follow some simple instructions.  She seems to recognize her niece as someone familiar but cannot identify how she is related.    REVIEW OF SYSTEMS: Full 14 system review of systems performed and negative with exception of: As per HPI.  ALLERGIES: No Known Allergies  HOME MEDICATIONS: Outpatient Medications Prior to Visit  Medication Sig Dispense Refill   atorvastatin (LIPITOR) 10 MG tablet Take 10 mg by mouth daily.     calcitRIOL (ROCALTROL) 0.5 MCG capsule Take 0.5 mcg by mouth daily.     donepezil (ARICEPT ODT) 5 MG disintegrating tablet Take 5 mg by mouth at bedtime.     hyoscyamine (LEVSIN, ANASPAZ) 0.125 MG tablet TAKE 1 TABLET BY MOUTH 4 TIMES DAILY AS NEEDED  0   isosorbide dinitrate (ISORDIL) 5 MG tablet Take 5 mg by mouth daily with lunch.     polyethylene glycol (MIRALAX / GLYCOLAX) packet Take 17 g by mouth daily.     warfarin (COUMADIN) 3 MG tablet Take 3 mg by mouth daily.     No facility-administered medications prior to visit.     PAST MEDICAL HISTORY: Past Medical History:  Diagnosis Date     Alzheimer's disease ([place])    Atrial fibrillation ([place])    Chronic kidney disease    Stage 4   CVA (cerebrovascular accident) ([place])    DVT (deep venous thrombosis) ([place]) 2013   LLE   Hyperlipidemia    Hypertension    Pacemaker    Pulmonary embolism ([place])     PAST SURGICAL HISTORY: Past Surgical History:  Procedure Laterality Date   CATARACT EXTRACTION, BILATERAL     PACEMAKER IMPLANT  2013    FAMILY HISTORY: Family History  Problem Relation Age of Onset   Dementia Mother    Heart attack Father    Stroke Father    Hypertension Father    Heart attack Sister    Heart attack Brother    Heart attack Brother    Stroke Brother    Hypertension Brother    Diabetes Brother    Hyperlipidemia Sister    Dementia Sister     SOCIAL HISTORY: Social History   Socioeconomic History   Marital status: Widowed    Spouse name: Not on file   Number of children: 1   Years of education: Not on file   Highest education level: Associate degree: academic program  Occupational History    Comment: retired  Scientist, product/process development strain: Not on file   Food insecurity    Worry: Not on file    Inability: Not  on file   Transportation needs    Medical: Not on file    Non-medical: Not on file  Tobacco Use   Smoking status: Former Smoker   Smokeless tobacco: Never Used  Substance and Sexual Activity   Alcohol use: Not Currently   Drug use: Never   Sexual activity: Not on file  Lifestyle   Physical activity    Days per week: Not on file    Minutes per session: Not on file   Stress: Not on file  Relationships   Social connections    Talks on phone: Not on file    Gets together: Not on file    Attends religious service: Not on file    Active member of club or organization: Not on file    Attends meetings of clubs or organizations: Not on file    Relationship status: Not on file   Intimate partner violence    Fear of current or  ex partner: Not on file    Emotionally abused: Not on file    Physically abused: Not on file    Forced sexual activity: Not on file  Other Topics Concern   Not on file  Social History Narrative   10/24/18 lives with Ship broker, Hailey Lopez, from [place], husband passed in 208, only child (son) passed 32     PHYSICAL EXAM  GENERAL EXAM/CONSTITUTIONAL: Vitals:  Vitals:   10/25/18 1010  BP: 121/70  Pulse: 85  Temp: (!) 96.9 F (36.1 C)  Weight: 149 lb (67.6 kg)  Height: 5\' 3"  (1.6 m)     Body mass index is 26.39 kg/m. Wt Readings from Last 3 Encounters:  10/25/18 149 lb (67.6 kg)  09/19/18 147 lb (66.7 kg)  11/23/17 153 lb 6.4 oz (69.6 kg)     Patient is in no distress; well developed, nourished and groomed; neck is supple  CARDIOVASCULAR:  Examination of carotid arteries is normal; no carotid bruits  Regular rate and rhythm, no murmurs  Examination of peripheral vascular system by observation and palpation is normal  EYES:  Ophthalmoscopic exam of optic discs and posterior segments is normal; no papilledema or hemorrhages  No exam data present  MUSCULOSKELETAL:  Gait, strength, tone, movements noted in Neurologic exam below  NEUROLOGIC: MENTAL STATUS:  MMSE - [place] Exam 10/25/2018  Not completed: Unable to complete    awake, alert  LIMITED memory   LIMITED attention and concentration  DECR FLUENCY AND COMPREHENSION; SAYS FEW WORDS; [place] of knowledge LIMITED  CRANIAL NERVE:   2nd - no papilledema on fundoscopic exam  2nd, 3rd, 4th, 6th - pupils equal and reactive to light, visual fields full to confrontation, extraocular muscles intact, no nystagmus  5th - facial sensation symmetric  7th - facial strength symmetric  8th - hearing intact  9th - palate elevates symmetrically, uvula midline  11th - shoulder shrug symmetric  12th - tongue protrusion midline  MOTOR:   normal bulk and tone, DIFFUSE 4/5 strength in  the BUE, BLE  SENSORY:   normal and symmetric to light touch  COORDINATION:   finger-nose-finger, fine finger movements SLOW   REFLEXES:   deep tendon reflexes TRACE and symmetric  GAIT/STATION:   IN WHEELCHAIR     DIAGNOSTIC DATA (LABS, IMAGING, TESTING) - I reviewed patient records, labs, notes, testing and imaging myself where available.  Lab Results  Component Value Date   WBC 4.5 06/01/2017   HGB 13.5 06/01/2017   HCT 40.1 06/01/2017  MCV 91.3 06/01/2017   PLT 266 06/01/2017      Component Value Date/Time   NA 140 06/01/2017 1402   K 4.7 06/01/2017 1402   CL 106 06/01/2017 1402   CO2 25 06/01/2017 1402   GLUCOSE 103 (H) 06/01/2017 1402   BUN 22 (H) 06/01/2017 1402   CREATININE 2.30 (H) 06/01/2017 1402   CALCIUM 9.9 06/01/2017 1402   PROT 8.1 06/01/2017 1402   ALBUMIN 4.2 06/01/2017 1402   AST 23 06/01/2017 1402   ALT 17 06/01/2017 1402   ALKPHOS 54 06/01/2017 1402   BILITOT 0.6 06/01/2017 1402   GFRNONAA 18 (L) 06/01/2017 1402   GFRAA 21 (L) 06/01/2017 1402   No results found for: CHOL, HDL, LDLCALC, LDLDIRECT, TRIG, CHOLHDL No results found for: HGBA1C No results found for: VITAMINB12 No results found for: TSH    ASSESSMENT AND PLAN  85 y.o. year old female here with severe dementia.  Dx:  1. Severe dementia ([place])      PLAN:  SEVERE DEMENTIA - diagnosis, prognosis, treatment options reviewed; currently on donepezil - continue current plan; recommend conservative management and palliative approach. - safety / supervision issues reviewed - caregiver resources provided ([facility], [facility], [facility])  Return for return to PCP, pending if symptoms worsen or fail to improve.    Penni Bombard, MD [id number], [id number] AM Certified in Neurology, Neurophysiology and Neuroimaging  [facility] [address], [place], [place] [address] [phone]

## 2018-11-13 ENCOUNTER — Ambulatory Visit (INDEPENDENT_AMBULATORY_CARE_PROVIDER_SITE_OTHER): Payer: Medicare Other | Admitting: Podiatry

## 2018-11-13 ENCOUNTER — Encounter: Payer: Self-pay | Admitting: Podiatry

## 2018-11-13 ENCOUNTER — Other Ambulatory Visit: Payer: Self-pay

## 2018-11-13 DIAGNOSIS — M79674 Pain in right toe(s): Secondary | ICD-10-CM

## 2018-11-13 DIAGNOSIS — B351 Tinea unguium: Secondary | ICD-10-CM

## 2018-11-13 DIAGNOSIS — M79675 Pain in left toe(s): Secondary | ICD-10-CM

## 2018-11-13 NOTE — Progress Notes (Signed)
Subjective: Hailey Lopez is seen today for follow up painful, elongated, thickened toenails 1-5 b/l feet that she cannot cut. Pain interferes with daily activities. Aggravating factor includes wearing enclosed shoe gear and relieved with periodic debridement.  She has dementia and is accompanied by her niece/primary caregiver today. She voices no new pedal concerns on today's visit.  Current Outpatient Medications on File Prior to Visit  Medication Sig  . atorvastatin (LIPITOR) 10 MG tablet Take 10 mg by mouth daily.  . calcitRIOL (ROCALTROL) 0.5 MCG capsule Take 0.5 mcg by mouth daily.  Marland Kitchen donepezil (ARICEPT ODT) 5 MG disintegrating tablet Take 5 mg by mouth at bedtime.  . hyoscyamine (LEVSIN, ANASPAZ) 0.125 MG tablet TAKE 1 TABLET BY MOUTH 4 TIMES DAILY AS NEEDED  . isosorbide dinitrate (ISORDIL) 5 MG tablet Take 5 mg by mouth daily with lunch.  . polyethylene glycol (MIRALAX / GLYCOLAX) packet Take 17 g by mouth daily.  Marland Kitchen warfarin (COUMADIN) 3 MG tablet Take 3 mg by mouth daily.   No current facility-administered medications on file prior to visit.      No Known Allergies   Objective:  Vascular Examination: Capillary refill time <3 seconds b/l.  Dorsalis pedis faintly palpable b/l.  Posterior tibial pulses faintly palpable b/l.  Digital hair absent b/l.  Skin temperature gradient warm to cool b/l.   Dermatological Examination: Skin with normal turgor, texture and tone b/l.  Toenails 1-5 b/l discolored, thick, dystrophic with subungual debris and pain with palpation to nailbeds due to thickness of nails.  Musculoskeletal: Muscle strength 5/5 b/l to all LE muscle groups.  No gross bony deformities b/l.  No pain, crepitus or joint limitation noted with ROM.   Neurological Examination: UTA sensation due to cognition, but she does respond to external noxious stimuli.   Assessment: Painful onychomycosis toenails 1-5 b/l   Plan: 1. Toenails 1-5 b/l were debrided in  length and girth without iatrogenic bleeding. 2. Patient to continue soft, supportive shoe gear. 3. Patient to report any pedal injuries to medical professional immediately. 4. Follow up 3 months.  5. Patient/POA to call should there be a concern in the interim.

## 2018-11-27 ENCOUNTER — Encounter: Payer: Medicare Other | Admitting: Cardiovascular Disease

## 2018-11-29 ENCOUNTER — Ambulatory Visit (INDEPENDENT_AMBULATORY_CARE_PROVIDER_SITE_OTHER): Payer: Medicare Other | Admitting: *Deleted

## 2018-11-29 DIAGNOSIS — I495 Sick sinus syndrome: Secondary | ICD-10-CM | POA: Diagnosis not present

## 2018-11-29 DIAGNOSIS — I480 Paroxysmal atrial fibrillation: Secondary | ICD-10-CM

## 2018-11-30 ENCOUNTER — Telehealth: Payer: Self-pay

## 2018-11-30 NOTE — Telephone Encounter (Signed)
Left message for patient to remind of missed remote transmission. LL 

## 2018-12-01 LAB — CUP PACEART REMOTE DEVICE CHECK
Battery Remaining Longevity: 65 mo
Battery Remaining Percentage: 51 %
Battery Voltage: 2.87 V
Brady Statistic AP VP Percent: 29 %
Brady Statistic AP VS Percent: 47 %
Brady Statistic AS VP Percent: 1 %
Brady Statistic AS VS Percent: 22 %
Brady Statistic RA Percent Paced: 75 %
Brady Statistic RV Percent Paced: 30 %
Date Time Interrogation Session: 20201113131103
Implantable Lead Implant Date: 20130205
Implantable Lead Implant Date: 20130205
Implantable Lead Location: 753859
Implantable Lead Location: 753860
Implantable Pulse Generator Implant Date: 20130205
Lead Channel Impedance Value: 360 Ohm
Lead Channel Impedance Value: 430 Ohm
Lead Channel Pacing Threshold Amplitude: 0.625 V
Lead Channel Pacing Threshold Amplitude: 0.875 V
Lead Channel Pacing Threshold Pulse Width: 0.4 ms
Lead Channel Pacing Threshold Pulse Width: 0.4 ms
Lead Channel Sensing Intrinsic Amplitude: 1.5 mV
Lead Channel Sensing Intrinsic Amplitude: 5.2 mV
Lead Channel Setting Pacing Amplitude: 1.125
Lead Channel Setting Pacing Amplitude: 1.625
Lead Channel Setting Pacing Pulse Width: 0.4 ms
Lead Channel Setting Sensing Sensitivity: 2 mV
Pulse Gen Model: 2210
Pulse Gen Serial Number: 7317766

## 2018-12-18 ENCOUNTER — Other Ambulatory Visit: Payer: Self-pay

## 2018-12-18 ENCOUNTER — Ambulatory Visit (INDEPENDENT_AMBULATORY_CARE_PROVIDER_SITE_OTHER): Payer: Medicare Other | Admitting: Cardiovascular Disease

## 2018-12-18 ENCOUNTER — Ambulatory Visit (INDEPENDENT_AMBULATORY_CARE_PROVIDER_SITE_OTHER): Payer: Medicare Other | Admitting: Pharmacist Clinician (PhC)/ Clinical Pharmacy Specialist

## 2018-12-18 ENCOUNTER — Encounter: Payer: Self-pay | Admitting: Cardiovascular Disease

## 2018-12-18 VITALS — BP 119/73 | HR 84 | Temp 96.8°F | Ht 63.0 in | Wt 144.4 lb

## 2018-12-18 DIAGNOSIS — Z7901 Long term (current) use of anticoagulants: Secondary | ICD-10-CM | POA: Diagnosis not present

## 2018-12-18 DIAGNOSIS — I480 Paroxysmal atrial fibrillation: Secondary | ICD-10-CM | POA: Diagnosis not present

## 2018-12-18 LAB — POCT INR: INR: 2.4 (ref 2.0–3.0)

## 2018-12-18 NOTE — Progress Notes (Signed)
Cardiology Office Note:    Date:  12/18/2018   ID:  Loney, Balram 12-May-1929, MRN [id number]  PCP:  Marda Stalker, PA-C  Cardiologist:  Avion Patella Electrophysiologist:  None   Referring MD: Dineen Kid, MD   No chief complaint on file.   History of Present Illness:    Hailey Lopez is a 85 y.o. female with a hx of sick sinus syndrome and paroxysmal atrial fibrillation status post dual-chamber permanent pacemaker Hydrologist DR RF implanted 2013 in [place]).  She moved here to live with her niece Hailey Lopez, after developing worsening dementia.  She has a previous history of stroke and pulmonary embolism although the details are uncertain.  She has advanced stage chronic kidney disease, hypertension hypercholesterolemia.  She has had an uneventful year from the point of view of her valve, but her dementia is very slightly worse.  INR is checked today.  She has not had any falls, injuries or serious bleeding problems.  Pacemaker interrogation shows normal device function.  Estimated generator longevity is 5.3 years.  She is not device dependent.  Lead parameters are all excellent.  She has 75 % atrial pacing and only 29% ventricular pacing.  She has not had any atrial fibrillation since October 2019.  She had a 16-second episode of atrial tachycardia since her last device check.  Cybersecurity upgrade was performed today.   Past Medical History:  Diagnosis Date  . Alzheimer's disease ([place])   . Atrial fibrillation ([place])   . Chronic kidney disease    Stage 4  . CVA (cerebrovascular accident) ([place])   . DVT (deep venous thrombosis) ([place]) 2013   LLE  . Hyperlipidemia   . Hypertension   . Pacemaker   . Pulmonary embolism [facility])     Past Surgical History:  Procedure Laterality Date  . CATARACT EXTRACTION, BILATERAL    . PACEMAKER IMPLANT  2013    Current Medications: Current Meds  Medication Sig  . atorvastatin (LIPITOR) 10 MG tablet Take 10 mg by mouth  daily.  . calcitRIOL (ROCALTROL) 0.5 MCG capsule Take 0.5 mcg by mouth daily.  Marland Kitchen donepezil (ARICEPT ODT) 5 MG disintegrating tablet Take 5 mg by mouth at bedtime.  . hyoscyamine (LEVSIN, ANASPAZ) 0.125 MG tablet TAKE 1 TABLET BY MOUTH 4 TIMES DAILY AS NEEDED  . isosorbide dinitrate (ISORDIL) 5 MG tablet Take 5 mg by mouth daily with lunch.  . polyethylene glycol (MIRALAX / GLYCOLAX) packet Take 17 g by mouth daily.  Marland Kitchen warfarin (COUMADIN) 3 MG tablet Take 3 mg by mouth daily.     Allergies:   Patient has no known allergies.   Social History   Socioeconomic History  . Marital status: Widowed    Spouse name: Not on file  . Number of children: 1  . Years of education: Not on file  . Highest education level: Associate degree: academic program  Occupational History    Comment: retired  Scientific laboratory technician  . Financial resource strain: Not on file  . Food insecurity    Worry: Not on file    Inability: Not on file  . Transportation needs    Medical: Not on file    Non-medical: Not on file  Tobacco Use  . Smoking status: Former Research scientist (life sciences)  . Smokeless tobacco: Never Used  Substance and Sexual Activity  . Alcohol use: Not Currently  . Drug use: Never  . Sexual activity: Not on file  Lifestyle  . Physical activity    Days per  week: Not on file    Minutes per session: Not on file  . Stress: Not on file  Relationships  . Social Herbalist on phone: Not on file    Gets together: Not on file    Attends religious service: Not on file    Active member of club or organization: Not on file    Attends meetings of clubs or organizations: Not on file    Relationship status: Not on file  Other Topics Concern  . Not on file  Social History Narrative   10/24/18 lives with Ship broker, Hailey Lopez, from [place], husband passed in 208, only child (son) passed 43     Family History: The patient's family history includes Dementia in her mother and sister; Diabetes in her brother; Heart attack in  her brother, brother, father, and sister; Hyperlipidemia in her sister; Hypertension in her brother and father; Stroke in her brother and father.  ROS:   Please see the history of present illness.    The patient specifically denies any chest pain at rest exertion, dyspnea at rest or with exertion, orthopnea, paroxysmal nocturnal dyspnea, syncope, palpitations, focal neurological deficits, intermittent claudication, lower extremity edema, unexplained weight gain, cough, hemoptysis or wheezing.   EKGs/Labs/Other Studies Reviewed:    The following studies were reviewed today:   EKG:  EKG is ordered today.  It shows sinus rhythm with first-degree AV block (PR 256 ms), atypical right bundle branch block with voltage suggestive of LVH, T wave inversion V3-V6 in the inferior leads.  Recent Labs: No results found for requested labs within last 8760 hours.  1 10/17/2018 hemoglobin 13.1, creatinine 2.17, potassium 4.5, normal liver function tests Recent Lipid Panel No results found for: CHOL, TRIG, HDL, CHOLHDL, VLDL, LDLCALC, LDLDIRECT  10/17/2018 total cholesterol 274, HDL 54, LDL 197, triglycerides 116  Physical Exam:    VS:  BP 119/73   Pulse 84   Temp (!) 96.8 F (36 C)   Ht 5\' 3"  (1.6 m)   Wt 144 lb 6.4 oz (65.5 kg)   SpO2 94%   BMI 25.58 kg/m     Wt Readings from Last 3 Encounters:  12/18/18 144 lb 6.4 oz (65.5 kg)  10/25/18 149 lb (67.6 kg)  09/19/18 147 lb (66.7 kg)     General: Alert, oriented x3, no distress, very quiet and withdrawn Head: no evidence of trauma, PERRL, EOMI, no exophtalmos or lid lag, no myxedema, no xanthelasma; normal ears, nose and oropharynx Neck: normal jugular venous pulsations and no hepatojugular reflux; brisk carotid pulses without delay and no carotid bruits Chest: clear to auscultation, no signs of consolidation by percussion or palpation, normal fremitus, symmetrical and full respiratory excursions.  Healthy left subclavian pacemaker scar  Cardiovascular: normal position and quality of the apical impulse, regular rhythm, normal first and second heart sounds, no murmurs, rubs or gallops Abdomen: no tenderness or distention, no masses by palpation, no abnormal pulsatility or arterial bruits, normal bowel sounds, no hepatosplenomegaly Extremities: no clubbing, cyanosis or edema; 2+ radial, ulnar and brachial pulses bilaterally; 2+ right femoral, posterior tibial and dorsalis pedis pulses; 2+ left femoral, posterior tibial and dorsalis pedis pulses; no subclavian or femoral bruits Neurological: grossly nonfocal Psych: Normal mood and affect  ASSESSMENT:    1. Paroxysmal atrial fibrillation (HCC)    PLAN:    In order of problems listed above:  1. SSS: She is very sedentary.  Heart rate histograms appear appropriate. 2. AFib: Extremely low burden of arrhythmia (  was less than 1% in 2019 and none has been detected since October 2019). 3. PPM: Continue remote downloads every 3 months 4. History of pulmonary embolism: On chronic anticoagulation 5. CKD 4: Little change in creatinine 6. HTN: Excellent control 7. HLP: LDL cholesterol severely elevated.  Not clear to me whether she is actually receiving her statin.  In view of her progressive dementia I am not sure whether continued or more aggressive treatment is truly in her favor.  Will allow Dr. Oval Linsey to address that with her family.   Medication Adjustments/Labs and Tests Ordered: Current medicines are reviewed at length with the patient today.  Concerns regarding medicines are outlined above.  Orders Placed This Encounter  Procedures  . EKG 12-Lead   No orders of the defined types were placed in this encounter.   Patient Instructions  Medication Instructions:  No changes *If you need a refill on your cardiac medications before your next appointment, please call your pharmacy*  Lab Work: None ordered If you have labs (blood work) drawn today and your tests are  completely normal, you will receive your results only by: Marland Kitchen MyChart Message (if you have MyChart) OR . A paper copy in the mail If you have any lab test that is abnormal or we need to change your treatment, we will call you to review the results.  Testing/Procedures: None ordered  Follow-Up: At [facility], you and your health needs are our priority.  As part of our continuing mission to provide you with exceptional heart care, we have created designated Provider Care Teams.  These Care Teams include your primary Cardiologist (physician) and Advanced Practice Providers (APPs -  Physician Assistants and Nurse Practitioners) who all work together to provide you with the care you need, when you need it.  Your next appointment:   12 month(s)  The format for your next appointment:   In Person  Provider:   Sanda Klein, MD      Signed, Sanda Klein, MD  12/18/2018 11:49 AM    [place]

## 2018-12-18 NOTE — Patient Instructions (Signed)

## 2018-12-21 NOTE — Progress Notes (Signed)
Remote pacemaker transmission.   

## 2019-01-25 DIAGNOSIS — F0280 Dementia in other diseases classified elsewhere without behavioral disturbance: Secondary | ICD-10-CM | POA: Diagnosis not present

## 2019-01-25 DIAGNOSIS — E782 Mixed hyperlipidemia: Secondary | ICD-10-CM | POA: Diagnosis not present

## 2019-01-25 DIAGNOSIS — I10 Essential (primary) hypertension: Secondary | ICD-10-CM | POA: Diagnosis not present

## 2019-01-25 DIAGNOSIS — I480 Paroxysmal atrial fibrillation: Secondary | ICD-10-CM | POA: Diagnosis not present

## 2019-01-29 ENCOUNTER — Ambulatory Visit (INDEPENDENT_AMBULATORY_CARE_PROVIDER_SITE_OTHER): Payer: Medicare Other | Admitting: Pharmacist Clinician (PhC)/ Clinical Pharmacy Specialist

## 2019-01-29 ENCOUNTER — Encounter: Payer: Self-pay | Admitting: Pharmacist Clinician (PhC)/ Clinical Pharmacy Specialist

## 2019-01-29 ENCOUNTER — Other Ambulatory Visit: Payer: Self-pay

## 2019-01-29 DIAGNOSIS — Z7901 Long term (current) use of anticoagulants: Secondary | ICD-10-CM | POA: Diagnosis not present

## 2019-01-29 DIAGNOSIS — I480 Paroxysmal atrial fibrillation: Secondary | ICD-10-CM

## 2019-01-29 LAB — POCT INR: INR: 1.9 — AB (ref 2.0–3.0)

## 2019-02-19 ENCOUNTER — Encounter: Payer: Self-pay | Admitting: Podiatry

## 2019-02-19 ENCOUNTER — Other Ambulatory Visit: Payer: Self-pay

## 2019-02-19 ENCOUNTER — Ambulatory Visit (INDEPENDENT_AMBULATORY_CARE_PROVIDER_SITE_OTHER): Payer: Medicare Other | Admitting: Podiatry

## 2019-02-19 DIAGNOSIS — B351 Tinea unguium: Secondary | ICD-10-CM

## 2019-02-19 DIAGNOSIS — M79675 Pain in left toe(s): Secondary | ICD-10-CM | POA: Diagnosis not present

## 2019-02-19 DIAGNOSIS — M79674 Pain in right toe(s): Secondary | ICD-10-CM | POA: Diagnosis not present

## 2019-02-19 NOTE — Progress Notes (Signed)
Subjective: Hailey Lopez presents today for follow up of painful mycotic nails b/l that are difficult to trim. Pain interferes with ambulation. Aggravating factors include wearing enclosed shoe gear. Pain is relieved with periodic professional debridement.  Her niece is present during the visit. She voices no new problems on today's visit.   No Known Allergies   Objective: There were no vitals filed for this visit.  Vascular Examination:  Capillary refill time to digits immediate b/l, faintly palpable DP pulses b/l, faintly palpable PT pulses b/l, pedal hair absent b/l and skin temperature gradient warm to cool b/l  Dermatological Examination: Pedal skin with normal turgor, texture and tone bilaterally, no open wounds bilaterally, no interdigital macerations bilaterally and toenails 1-5 b/l elongated, dystrophic, thickened, crumbly with subungual debris  Musculoskeletal: Normal muscle strength 5/5 to all lower extremity muscle groups bilaterally, no gross bony deformities bilaterally and no pain crepitus or joint limitation noted with ROM b/l  Neurological: UTA due to cognition, but she does respond to external noxious stimuli.  Assessment: 1. Pain due to onychomycosis of toenails of both feet      Plan: -Toenails 1-5 b/l were debrided in length and girth without iatrogenic bleeding. -Patient to continue soft, supportive shoe gear daily. -Patient to report any pedal injuries to medical professional immediately. -Patient/POA to call should there be question/concern in the interim.  Return in about 3 months (around 05/19/2019) for nail trim/ Coumadin.

## 2019-02-19 NOTE — Patient Instructions (Signed)

## 2019-02-28 ENCOUNTER — Ambulatory Visit (INDEPENDENT_AMBULATORY_CARE_PROVIDER_SITE_OTHER): Payer: Medicare Other | Admitting: *Deleted

## 2019-02-28 DIAGNOSIS — I495 Sick sinus syndrome: Secondary | ICD-10-CM

## 2019-03-01 DIAGNOSIS — N189 Chronic kidney disease, unspecified: Secondary | ICD-10-CM | POA: Diagnosis not present

## 2019-03-01 DIAGNOSIS — N184 Chronic kidney disease, stage 4 (severe): Secondary | ICD-10-CM | POA: Diagnosis not present

## 2019-03-01 DIAGNOSIS — I129 Hypertensive chronic kidney disease with stage 1 through stage 4 chronic kidney disease, or unspecified chronic kidney disease: Secondary | ICD-10-CM | POA: Diagnosis not present

## 2019-03-01 DIAGNOSIS — N2581 Secondary hyperparathyroidism of renal origin: Secondary | ICD-10-CM | POA: Diagnosis not present

## 2019-03-01 DIAGNOSIS — Z862 Personal history of diseases of the blood and blood-forming organs and certain disorders involving the immune mechanism: Secondary | ICD-10-CM | POA: Diagnosis not present

## 2019-03-02 LAB — CUP PACEART REMOTE DEVICE CHECK
Battery Remaining Longevity: 56 mo
Battery Remaining Percentage: 46 %
Battery Voltage: 2.86 V
Brady Statistic AP VP Percent: 27 %
Brady Statistic AP VS Percent: 50 %
Brady Statistic AS VP Percent: 1 %
Brady Statistic AS VS Percent: 23 %
Brady Statistic RA Percent Paced: 75 %
Brady Statistic RV Percent Paced: 27 %
Date Time Interrogation Session: 20210212015714
Implantable Lead Implant Date: 20130205
Implantable Lead Implant Date: 20130205
Implantable Lead Location: 753859
Implantable Lead Location: 753860
Implantable Pulse Generator Implant Date: 20130205
Lead Channel Impedance Value: 300 Ohm
Lead Channel Impedance Value: 400 Ohm
Lead Channel Pacing Threshold Amplitude: 0.5 V
Lead Channel Pacing Threshold Amplitude: 0.875 V
Lead Channel Pacing Threshold Pulse Width: 0.4 ms
Lead Channel Pacing Threshold Pulse Width: 0.4 ms
Lead Channel Sensing Intrinsic Amplitude: 2.1 mV
Lead Channel Sensing Intrinsic Amplitude: 4.1 mV
Lead Channel Setting Pacing Amplitude: 1.125
Lead Channel Setting Pacing Amplitude: 1.5 V
Lead Channel Setting Pacing Pulse Width: 0.4 ms
Lead Channel Setting Sensing Sensitivity: 2 mV
Pulse Gen Model: 2210
Pulse Gen Serial Number: 7317766

## 2019-03-02 NOTE — Progress Notes (Signed)
PPM Remote  

## 2019-03-12 ENCOUNTER — Ambulatory Visit (INDEPENDENT_AMBULATORY_CARE_PROVIDER_SITE_OTHER): Payer: Medicare Other | Admitting: Pharmacist Clinician (PhC)/ Clinical Pharmacy Specialist

## 2019-03-12 ENCOUNTER — Other Ambulatory Visit: Payer: Self-pay

## 2019-03-12 DIAGNOSIS — I480 Paroxysmal atrial fibrillation: Secondary | ICD-10-CM | POA: Diagnosis not present

## 2019-03-12 DIAGNOSIS — Z7901 Long term (current) use of anticoagulants: Secondary | ICD-10-CM

## 2019-03-12 LAB — POCT INR: INR: 2.1 (ref 2.0–3.0)

## 2019-03-12 NOTE — Patient Instructions (Signed)
Continue taking 3mg  daily. Recheck in 6 weeks. Call us with any medication changes or concern- Coumadin clinic # [phone], Main # [phone].

## 2019-04-23 ENCOUNTER — Other Ambulatory Visit: Payer: Self-pay

## 2019-04-23 ENCOUNTER — Ambulatory Visit (INDEPENDENT_AMBULATORY_CARE_PROVIDER_SITE_OTHER): Payer: Medicare Other | Admitting: Pharmacist

## 2019-04-23 DIAGNOSIS — Z7901 Long term (current) use of anticoagulants: Secondary | ICD-10-CM

## 2019-04-23 DIAGNOSIS — I480 Paroxysmal atrial fibrillation: Secondary | ICD-10-CM | POA: Diagnosis not present

## 2019-04-23 LAB — POCT INR: INR: 2.5 (ref 2.0–3.0)

## 2019-05-21 ENCOUNTER — Ambulatory Visit: Payer: Medicare Other | Admitting: Podiatry

## 2019-05-30 ENCOUNTER — Ambulatory Visit (INDEPENDENT_AMBULATORY_CARE_PROVIDER_SITE_OTHER): Payer: Medicare Other | Admitting: *Deleted

## 2019-05-30 ENCOUNTER — Telehealth: Payer: Self-pay

## 2019-05-30 DIAGNOSIS — I495 Sick sinus syndrome: Secondary | ICD-10-CM

## 2019-05-30 DIAGNOSIS — I480 Paroxysmal atrial fibrillation: Secondary | ICD-10-CM

## 2019-05-30 NOTE — Telephone Encounter (Signed)
Left message for patient to remind of missed remote transmission.  

## 2019-05-31 LAB — CUP PACEART REMOTE DEVICE CHECK
Battery Remaining Longevity: 50 mo
Battery Remaining Percentage: 40 %
Battery Voltage: 2.84 V
Brady Statistic AP VP Percent: 31 %
Brady Statistic AP VS Percent: 44 %
Brady Statistic AS VP Percent: 1.1 %
Brady Statistic AS VS Percent: 22 %
Brady Statistic RA Percent Paced: 72 %
Brady Statistic RV Percent Paced: 32 %
Date Time Interrogation Session: 20210512153251
Implantable Lead Implant Date: 20130205
Implantable Lead Implant Date: 20130205
Implantable Lead Location: 753859
Implantable Lead Location: 753860
Implantable Pulse Generator Implant Date: 20130205
Lead Channel Impedance Value: 340 Ohm
Lead Channel Impedance Value: 430 Ohm
Lead Channel Pacing Threshold Amplitude: 0.625 V
Lead Channel Pacing Threshold Amplitude: 0.875 V
Lead Channel Pacing Threshold Pulse Width: 0.4 ms
Lead Channel Pacing Threshold Pulse Width: 0.4 ms
Lead Channel Sensing Intrinsic Amplitude: 1.6 mV
Lead Channel Sensing Intrinsic Amplitude: 7.4 mV
Lead Channel Setting Pacing Amplitude: 1.125
Lead Channel Setting Pacing Amplitude: 1.625
Lead Channel Setting Pacing Pulse Width: 0.4 ms
Lead Channel Setting Sensing Sensitivity: 2 mV
Pulse Gen Model: 2210
Pulse Gen Serial Number: 7317766

## 2019-06-01 NOTE — Progress Notes (Signed)
Remote pacemaker transmission.   

## 2019-06-04 ENCOUNTER — Ambulatory Visit (INDEPENDENT_AMBULATORY_CARE_PROVIDER_SITE_OTHER): Payer: Medicare Other | Admitting: Pharmacist Clinician (PhC)/ Clinical Pharmacy Specialist

## 2019-06-04 ENCOUNTER — Other Ambulatory Visit: Payer: Self-pay

## 2019-06-04 DIAGNOSIS — Z7901 Long term (current) use of anticoagulants: Secondary | ICD-10-CM

## 2019-06-04 DIAGNOSIS — Z86711 Personal history of pulmonary embolism: Secondary | ICD-10-CM | POA: Diagnosis not present

## 2019-06-04 DIAGNOSIS — I480 Paroxysmal atrial fibrillation: Secondary | ICD-10-CM | POA: Diagnosis not present

## 2019-06-04 LAB — POCT INR: INR: 1.7 — AB (ref 2.0–3.0)

## 2019-06-04 NOTE — Patient Instructions (Signed)
Take 1.5 tablets (4.5 mg) today Monday May 17, then continue taking 3mg  daily. Recheck in 3 weeks. Call us with any medication changes or concern- Coumadin clinic # [phone], Main # [phone].

## 2019-06-08 DIAGNOSIS — R8290 Unspecified abnormal findings in urine: Secondary | ICD-10-CM | POA: Diagnosis not present

## 2019-07-06 ENCOUNTER — Ambulatory Visit (INDEPENDENT_AMBULATORY_CARE_PROVIDER_SITE_OTHER): Payer: Medicare Other | Admitting: Pharmacist

## 2019-07-06 DIAGNOSIS — N184 Chronic kidney disease, stage 4 (severe): Secondary | ICD-10-CM | POA: Diagnosis not present

## 2019-07-06 DIAGNOSIS — I10 Essential (primary) hypertension: Secondary | ICD-10-CM | POA: Diagnosis not present

## 2019-07-06 DIAGNOSIS — Z7901 Long term (current) use of anticoagulants: Secondary | ICD-10-CM | POA: Diagnosis not present

## 2019-07-06 DIAGNOSIS — E782 Mixed hyperlipidemia: Secondary | ICD-10-CM | POA: Diagnosis not present

## 2019-07-06 DIAGNOSIS — G301 Alzheimer's disease with late onset: Secondary | ICD-10-CM | POA: Diagnosis not present

## 2019-07-06 DIAGNOSIS — I480 Paroxysmal atrial fibrillation: Secondary | ICD-10-CM | POA: Diagnosis not present

## 2019-07-06 LAB — POCT INR: INR: 3.6 — AB (ref 2.0–3.0)

## 2019-07-06 MED ORDER — WARFARIN SODIUM 3 MG PO TABS: 3.0000 mg | ORAL_TABLET | Freq: Every day | ORAL | 1 refills | Status: DC

## 2019-07-06 NOTE — Patient Instructions (Signed)
HOLD warfarin dose today ONLY, then continue taking 3mg  daily. Recheck in 3 weeks. Call us with any medication changes or concern- Coumadin clinic # [phone], Main # [phone].

## 2019-07-12 ENCOUNTER — Ambulatory Visit: Payer: Medicare Other | Admitting: Podiatry

## 2019-07-31 ENCOUNTER — Ambulatory Visit: Payer: Medicare Other | Admitting: Podiatry

## 2019-08-13 ENCOUNTER — Ambulatory Visit (INDEPENDENT_AMBULATORY_CARE_PROVIDER_SITE_OTHER): Payer: Medicare Other | Admitting: Pharmacist Clinician (PhC)/ Clinical Pharmacy Specialist

## 2019-08-13 ENCOUNTER — Other Ambulatory Visit: Payer: Self-pay

## 2019-08-13 DIAGNOSIS — I480 Paroxysmal atrial fibrillation: Secondary | ICD-10-CM | POA: Diagnosis not present

## 2019-08-13 DIAGNOSIS — Z7901 Long term (current) use of anticoagulants: Secondary | ICD-10-CM | POA: Diagnosis not present

## 2019-08-13 LAB — POCT INR: INR: 2.1 (ref 2.0–3.0)

## 2019-08-29 ENCOUNTER — Ambulatory Visit (INDEPENDENT_AMBULATORY_CARE_PROVIDER_SITE_OTHER): Payer: Medicare Other | Admitting: *Deleted

## 2019-08-29 DIAGNOSIS — I495 Sick sinus syndrome: Secondary | ICD-10-CM

## 2019-08-30 LAB — CUP PACEART REMOTE DEVICE CHECK
Battery Remaining Longevity: 43 mo
Battery Remaining Percentage: 35 %
Battery Voltage: 2.83 V
Brady Statistic AP VP Percent: 33 %
Brady Statistic AP VS Percent: 42 %
Brady Statistic AS VP Percent: 1.5 %
Brady Statistic AS VS Percent: 21 %
Brady Statistic RA Percent Paced: 72 %
Brady Statistic RV Percent Paced: 35 %
Date Time Interrogation Session: 20210811060800
Implantable Lead Implant Date: 20130205
Implantable Lead Implant Date: 20130205
Implantable Lead Location: 753859
Implantable Lead Location: 753860
Implantable Pulse Generator Implant Date: 20130205
Lead Channel Impedance Value: 330 Ohm
Lead Channel Impedance Value: 400 Ohm
Lead Channel Pacing Threshold Amplitude: 0.625 V
Lead Channel Pacing Threshold Amplitude: 0.875 V
Lead Channel Pacing Threshold Pulse Width: 0.4 ms
Lead Channel Pacing Threshold Pulse Width: 0.4 ms
Lead Channel Sensing Intrinsic Amplitude: 1.3 mV
Lead Channel Sensing Intrinsic Amplitude: 9.9 mV
Lead Channel Setting Pacing Amplitude: 1.125
Lead Channel Setting Pacing Amplitude: 1.625
Lead Channel Setting Pacing Pulse Width: 0.4 ms
Lead Channel Setting Sensing Sensitivity: 2 mV
Pulse Gen Model: 2210
Pulse Gen Serial Number: 7317766

## 2019-09-03 NOTE — Progress Notes (Signed)
Remote pacemaker transmission.   

## 2019-09-10 ENCOUNTER — Ambulatory Visit (INDEPENDENT_AMBULATORY_CARE_PROVIDER_SITE_OTHER): Payer: Medicare Other | Admitting: Pharmacist

## 2019-09-10 ENCOUNTER — Other Ambulatory Visit: Payer: Self-pay

## 2019-09-10 DIAGNOSIS — Z7901 Long term (current) use of anticoagulants: Secondary | ICD-10-CM

## 2019-09-10 DIAGNOSIS — I480 Paroxysmal atrial fibrillation: Secondary | ICD-10-CM

## 2019-09-10 LAB — POCT INR: INR: 1.6 — AB (ref 2.0–3.0)

## 2019-09-10 NOTE — Patient Instructions (Signed)
Description   Take 4.5mg  (1.5 tablets) today and tomorrow, then continue taking 1 tablet (3mg ) daily. Recheck in 3 weeks. Call us with any medication changes or concern- Coumadin clinic # [phone], Main # [phone].

## 2019-10-01 ENCOUNTER — Ambulatory Visit (INDEPENDENT_AMBULATORY_CARE_PROVIDER_SITE_OTHER): Payer: Medicare Other

## 2019-10-01 ENCOUNTER — Other Ambulatory Visit: Payer: Self-pay

## 2019-10-01 DIAGNOSIS — Z5181 Encounter for therapeutic drug level monitoring: Secondary | ICD-10-CM | POA: Diagnosis not present

## 2019-10-01 DIAGNOSIS — I480 Paroxysmal atrial fibrillation: Secondary | ICD-10-CM | POA: Diagnosis not present

## 2019-10-01 DIAGNOSIS — N2581 Secondary hyperparathyroidism of renal origin: Secondary | ICD-10-CM | POA: Diagnosis not present

## 2019-10-01 DIAGNOSIS — N189 Chronic kidney disease, unspecified: Secondary | ICD-10-CM | POA: Diagnosis not present

## 2019-10-01 DIAGNOSIS — Z862 Personal history of diseases of the blood and blood-forming organs and certain disorders involving the immune mechanism: Secondary | ICD-10-CM | POA: Diagnosis not present

## 2019-10-01 DIAGNOSIS — N184 Chronic kidney disease, stage 4 (severe): Secondary | ICD-10-CM | POA: Diagnosis not present

## 2019-10-01 DIAGNOSIS — Z7901 Long term (current) use of anticoagulants: Secondary | ICD-10-CM | POA: Diagnosis not present

## 2019-10-01 DIAGNOSIS — I129 Hypertensive chronic kidney disease with stage 1 through stage 4 chronic kidney disease, or unspecified chronic kidney disease: Secondary | ICD-10-CM | POA: Diagnosis not present

## 2019-10-01 LAB — POCT INR: INR: 1.8 — AB (ref 2.0–3.0)

## 2019-10-01 NOTE — Patient Instructions (Signed)
Take 2 tablets today and then continue taking 1 tablet (3mg ) daily. Recheck in 3 weeks. Call us with any medication changes or concern- Coumadin clinic # [phone], Main # [phone].

## 2019-10-22 ENCOUNTER — Ambulatory Visit (INDEPENDENT_AMBULATORY_CARE_PROVIDER_SITE_OTHER): Payer: Medicare Other

## 2019-10-22 ENCOUNTER — Other Ambulatory Visit: Payer: Self-pay

## 2019-10-22 DIAGNOSIS — Z23 Encounter for immunization: Secondary | ICD-10-CM | POA: Diagnosis not present

## 2019-10-22 DIAGNOSIS — Z5181 Encounter for therapeutic drug level monitoring: Secondary | ICD-10-CM

## 2019-10-22 DIAGNOSIS — Z7901 Long term (current) use of anticoagulants: Secondary | ICD-10-CM | POA: Diagnosis not present

## 2019-10-22 DIAGNOSIS — Z0000 Encounter for general adult medical examination without abnormal findings: Secondary | ICD-10-CM | POA: Diagnosis not present

## 2019-10-22 DIAGNOSIS — I480 Paroxysmal atrial fibrillation: Secondary | ICD-10-CM

## 2019-10-22 DIAGNOSIS — Z1389 Encounter for screening for other disorder: Secondary | ICD-10-CM | POA: Diagnosis not present

## 2019-10-22 LAB — POCT INR: INR: 2.6 (ref 2.0–3.0)

## 2019-10-22 NOTE — Patient Instructions (Signed)
continue taking 1 tablet (3mg ) daily. Recheck in 4 weeks. Call us with any medication changes or concern- Coumadin clinic # [phone], Main # [phone].

## 2019-10-29 ENCOUNTER — Other Ambulatory Visit: Payer: Self-pay

## 2019-10-29 ENCOUNTER — Encounter: Payer: Self-pay | Admitting: Podiatry

## 2019-10-29 ENCOUNTER — Ambulatory Visit: Payer: Medicare Other | Admitting: Podiatry

## 2019-10-29 DIAGNOSIS — M79675 Pain in left toe(s): Secondary | ICD-10-CM | POA: Diagnosis not present

## 2019-10-29 DIAGNOSIS — M79674 Pain in right toe(s): Secondary | ICD-10-CM

## 2019-10-29 DIAGNOSIS — B351 Tinea unguium: Secondary | ICD-10-CM

## 2019-10-30 NOTE — Progress Notes (Signed)
Subjective: Hailey Lopez presents today for follow up of painful mycotic nails b/l that are difficult to trim. Pain interferes with ambulation. Aggravating factors include wearing enclosed shoe gear. Pain is relieved with periodic professional debridement.  Hailey Lopez has h/o dementia. Her niece is her caretaker. She voices no new pedal problems on today's visit.  No Known Allergies   Objective: There were no vitals filed for this visit.  Vascular Examination:  Capillary refill time to digits immediate b/l, faintly palpable DP pulses b/l, faintly palpable PT pulses b/l, pedal hair absent b/l and skin temperature gradient warm to cool b/l  Dermatological Examination: Pedal skin with normal turgor, texture and tone bilaterally, no open wounds bilaterally, no interdigital macerations bilaterally and toenails 1-5 b/l elongated, dystrophic, thickened, crumbly with subungual debris  Musculoskeletal: Normal muscle strength 5/5 to all lower extremity muscle groups bilaterally, no gross bony deformities bilaterally and no pain crepitus or joint limitation noted with ROM b/l  Neurological: UTA due to cognition, but she does respond to external noxious stimuli.  Assessment: 1. Pain due to onychomycosis of toenails of both feet     Plan: -Toenails 1-5 b/l were debrided in length and girth without iatrogenic bleeding. -Patient to continue soft, supportive shoe gear daily. -Patient to report any pedal injuries to medical professional immediately. -Patient/POA to call should there be question/concern in the interim.  Return in about 3 months (around 01/29/2020).

## 2019-11-28 ENCOUNTER — Ambulatory Visit (INDEPENDENT_AMBULATORY_CARE_PROVIDER_SITE_OTHER): Payer: Medicare Other

## 2019-11-28 DIAGNOSIS — I495 Sick sinus syndrome: Secondary | ICD-10-CM

## 2019-11-29 LAB — CUP PACEART REMOTE DEVICE CHECK
Battery Remaining Longevity: 36 mo
Battery Remaining Percentage: 29 %
Battery Voltage: 2.81 V
Brady Statistic AP VP Percent: 29 %
Brady Statistic AP VS Percent: 44 %
Brady Statistic AS VP Percent: 1.3 %
Brady Statistic AS VS Percent: 23 %
Brady Statistic RA Percent Paced: 70 %
Brady Statistic RV Percent Paced: 30 %
Date Time Interrogation Session: 20211110214429
Implantable Lead Implant Date: 20130205
Implantable Lead Implant Date: 20130205
Implantable Lead Location: 753859
Implantable Lead Location: 753860
Implantable Pulse Generator Implant Date: 20130205
Lead Channel Impedance Value: 300 Ohm
Lead Channel Impedance Value: 410 Ohm
Lead Channel Pacing Threshold Amplitude: 0.5 V
Lead Channel Pacing Threshold Amplitude: 1.125 V
Lead Channel Pacing Threshold Pulse Width: 0.4 ms
Lead Channel Pacing Threshold Pulse Width: 0.4 ms
Lead Channel Sensing Intrinsic Amplitude: 2.5 mV
Lead Channel Sensing Intrinsic Amplitude: 7.9 mV
Lead Channel Setting Pacing Amplitude: 1.375
Lead Channel Setting Pacing Amplitude: 1.5 V
Lead Channel Setting Pacing Pulse Width: 0.4 ms
Lead Channel Setting Sensing Sensitivity: 2 mV
Pulse Gen Model: 2210
Pulse Gen Serial Number: 7317766

## 2019-11-30 ENCOUNTER — Ambulatory Visit (INDEPENDENT_AMBULATORY_CARE_PROVIDER_SITE_OTHER): Payer: Medicare Other

## 2019-11-30 DIAGNOSIS — I480 Paroxysmal atrial fibrillation: Secondary | ICD-10-CM

## 2019-11-30 DIAGNOSIS — Z7901 Long term (current) use of anticoagulants: Secondary | ICD-10-CM

## 2019-11-30 DIAGNOSIS — Z5181 Encounter for therapeutic drug level monitoring: Secondary | ICD-10-CM | POA: Diagnosis not present

## 2019-11-30 LAB — POCT INR: INR: 2.6 (ref 2.0–3.0)

## 2019-11-30 NOTE — Progress Notes (Signed)
Remote pacemaker transmission.   

## 2019-11-30 NOTE — Patient Instructions (Signed)
continue taking 1 tablet (3mg ) daily. Recheck in 6 weeks. Call us with any medication changes or concern- Coumadin clinic # [phone], Main # [phone].

## 2019-12-17 ENCOUNTER — Encounter: Payer: Medicare Other | Admitting: Cardiovascular Disease

## 2019-12-20 ENCOUNTER — Encounter: Payer: Medicare Other | Admitting: Cardiovascular Disease

## 2019-12-23 ENCOUNTER — Emergency Department: Payer: Medicare Other

## 2019-12-23 ENCOUNTER — Inpatient Hospital Stay
Admission: EM | Admit: 2019-12-23 | Discharge: 2019-12-27 | DRG: 689 | Disposition: A | Discharge status: Home Health | Payer: Medicare Other | Attending: Internal Medicine | Admitting: Internal Medicine

## 2019-12-23 ENCOUNTER — Other Ambulatory Visit: Payer: Self-pay

## 2019-12-23 DIAGNOSIS — N3000 Acute cystitis without hematuria: Secondary | ICD-10-CM | POA: Diagnosis not present

## 2019-12-23 DIAGNOSIS — N390 Urinary tract infection, site not specified: Secondary | ICD-10-CM | POA: Diagnosis not present

## 2019-12-23 DIAGNOSIS — J189 Pneumonia, unspecified organism: Secondary | ICD-10-CM | POA: Diagnosis not present

## 2019-12-23 DIAGNOSIS — I440 Atrioventricular block, first degree: Secondary | ICD-10-CM | POA: Diagnosis not present

## 2019-12-23 DIAGNOSIS — N184 Chronic kidney disease, stage 4 (severe): Secondary | ICD-10-CM | POA: Diagnosis present

## 2019-12-23 DIAGNOSIS — Z7901 Long term (current) use of anticoagulants: Secondary | ICD-10-CM

## 2019-12-23 DIAGNOSIS — K5900 Constipation, unspecified: Secondary | ICD-10-CM | POA: Diagnosis present

## 2019-12-23 DIAGNOSIS — Z87891 Personal history of nicotine dependence: Secondary | ICD-10-CM | POA: Diagnosis not present

## 2019-12-23 DIAGNOSIS — I10 Essential (primary) hypertension: Secondary | ICD-10-CM | POA: Diagnosis not present

## 2019-12-23 DIAGNOSIS — Z83438 Family history of other disorder of lipoprotein metabolism and other lipidemia: Secondary | ICD-10-CM

## 2019-12-23 DIAGNOSIS — R059 Cough, unspecified: Secondary | ICD-10-CM | POA: Diagnosis not present

## 2019-12-23 DIAGNOSIS — E785 Hyperlipidemia, unspecified: Secondary | ICD-10-CM | POA: Diagnosis present

## 2019-12-23 DIAGNOSIS — Z8673 Personal history of transient ischemic attack (TIA), and cerebral infarction without residual deficits: Secondary | ICD-10-CM | POA: Diagnosis not present

## 2019-12-23 DIAGNOSIS — F0280 Dementia in other diseases classified elsewhere without behavioral disturbance: Secondary | ICD-10-CM | POA: Diagnosis present

## 2019-12-23 DIAGNOSIS — Z823 Family history of stroke: Secondary | ICD-10-CM | POA: Diagnosis not present

## 2019-12-23 DIAGNOSIS — R531 Weakness: Secondary | ICD-10-CM

## 2019-12-23 DIAGNOSIS — I129 Hypertensive chronic kidney disease with stage 1 through stage 4 chronic kidney disease, or unspecified chronic kidney disease: Secondary | ICD-10-CM | POA: Diagnosis present

## 2019-12-23 DIAGNOSIS — I480 Paroxysmal atrial fibrillation: Secondary | ICD-10-CM | POA: Diagnosis not present

## 2019-12-23 DIAGNOSIS — Z9841 Cataract extraction status, right eye: Secondary | ICD-10-CM

## 2019-12-23 DIAGNOSIS — R509 Fever, unspecified: Secondary | ICD-10-CM | POA: Diagnosis not present

## 2019-12-23 DIAGNOSIS — Z86718 Personal history of other venous thrombosis and embolism: Secondary | ICD-10-CM

## 2019-12-23 DIAGNOSIS — G309 Alzheimer's disease, unspecified: Secondary | ICD-10-CM | POA: Diagnosis present

## 2019-12-23 DIAGNOSIS — E872 Acidosis, unspecified: Secondary | ICD-10-CM

## 2019-12-23 DIAGNOSIS — I452 Bifascicular block: Secondary | ICD-10-CM | POA: Diagnosis not present

## 2019-12-23 DIAGNOSIS — N179 Acute kidney failure, unspecified: Secondary | ICD-10-CM | POA: Diagnosis not present

## 2019-12-23 DIAGNOSIS — E7800 Pure hypercholesterolemia, unspecified: Secondary | ICD-10-CM | POA: Diagnosis present

## 2019-12-23 DIAGNOSIS — Z9842 Cataract extraction status, left eye: Secondary | ICD-10-CM

## 2019-12-23 DIAGNOSIS — R0602 Shortness of breath: Secondary | ICD-10-CM | POA: Diagnosis not present

## 2019-12-23 DIAGNOSIS — Z833 Family history of diabetes mellitus: Secondary | ICD-10-CM

## 2019-12-23 DIAGNOSIS — Z8249 Family history of ischemic heart disease and other diseases of the circulatory system: Secondary | ICD-10-CM

## 2019-12-23 DIAGNOSIS — G9341 Metabolic encephalopathy: Secondary | ICD-10-CM

## 2019-12-23 DIAGNOSIS — Z86711 Personal history of pulmonary embolism: Secondary | ICD-10-CM | POA: Diagnosis not present

## 2019-12-23 DIAGNOSIS — Z79899 Other long term (current) drug therapy: Secondary | ICD-10-CM

## 2019-12-23 DIAGNOSIS — N189 Chronic kidney disease, unspecified: Secondary | ICD-10-CM | POA: Diagnosis not present

## 2019-12-23 DIAGNOSIS — N170 Acute kidney failure with tubular necrosis: Secondary | ICD-10-CM | POA: Diagnosis not present

## 2019-12-23 DIAGNOSIS — Z20822 Contact with and (suspected) exposure to covid-19: Secondary | ICD-10-CM | POA: Diagnosis present

## 2019-12-23 LAB — CBC WITH DIFFERENTIAL/PLATELET
Abs Immature Granulocytes: 0.02 10*3/uL (ref 0.00–0.07)
Basophils Absolute: 0 10*3/uL (ref 0.0–0.1)
Basophils Relative: 0 %
Eosinophils Absolute: 0 10*3/uL (ref 0.0–0.5)
Eosinophils Relative: 0 %
HCT: 43.5 % (ref 36.0–46.0)
Hemoglobin: 14.3 g/dL (ref 12.0–15.0)
Immature Granulocytes: 0 %
Lymphocytes Relative: 15 %
Lymphs Abs: 1.6 10*3/uL (ref 0.7–4.0)
MCH: 30.7 pg (ref 26.0–34.0)
MCHC: 32.9 g/dL (ref 30.0–36.0)
MCV: 93.3 fL (ref 80.0–100.0)
Monocytes Absolute: 1 10*3/uL (ref 0.1–1.0)
Monocytes Relative: 9 %
Neutro Abs: 7.9 10*3/uL — ABNORMAL HIGH (ref 1.7–7.7)
Neutrophils Relative %: 76 %
Platelets: 244 10*3/uL (ref 150–400)
RBC: 4.66 MIL/uL (ref 3.87–5.11)
RDW: 14.6 % (ref 11.5–15.5)
WBC: 10.5 10*3/uL (ref 4.0–10.5)
nRBC: 0 % (ref 0.0–0.2)

## 2019-12-23 LAB — COMPREHENSIVE METABOLIC PANEL
ALT: 16 U/L (ref 0–44)
AST: 27 U/L (ref 15–41)
Albumin: 3.5 g/dL (ref 3.5–5.0)
Alkaline Phosphatase: 44 U/L (ref 38–126)
Anion gap: 11 (ref 5–15)
BUN: 50 mg/dL — ABNORMAL HIGH (ref 8–23)
CO2: 25 mmol/L (ref 22–32)
Calcium: 10.2 mg/dL (ref 8.9–10.3)
Chloride: 107 mmol/L (ref 98–111)
Creatinine, Ser: 3.75 mg/dL — ABNORMAL HIGH (ref 0.44–1.00)
GFR, Estimated: 11 mL/min — ABNORMAL LOW (ref >60)
Glucose, Bld: 131 mg/dL — ABNORMAL HIGH (ref 70–99)
Potassium: 4.7 mmol/L (ref 3.5–5.1)
Sodium: 143 mmol/L (ref 135–145)
Total Bilirubin: 1.3 mg/dL — ABNORMAL HIGH (ref 0.3–1.2)
Total Protein: 8 g/dL (ref 6.5–8.1)

## 2019-12-23 LAB — URINALYSIS, COMPLETE (UACMP) WITH MICROSCOPIC
Bilirubin Urine: NEGATIVE
Glucose, UA: NEGATIVE mg/dL
Ketones, ur: NEGATIVE mg/dL
Nitrite: NEGATIVE
Protein, ur: 100 mg/dL — AB
Specific Gravity, Urine: 1.021 (ref 1.005–1.030)
WBC, UA: >50 WBC/hpf — ABNORMAL HIGH (ref 0–5)
pH: 6 (ref 5.0–8.0)

## 2019-12-23 LAB — LACTIC ACID, PLASMA
Lactic Acid, Venous: 2.2 mmol/L (ref 0.5–1.9)
Lactic Acid, Venous: 3.6 mmol/L (ref 0.5–1.9)
Lactic Acid, Venous: 4.6 mmol/L (ref 0.5–1.9)

## 2019-12-23 LAB — RESP PANEL BY RT-PCR (FLU A&B, COVID) ARPGX2
Influenza A by PCR: NEGATIVE
Influenza B by PCR: NEGATIVE
SARS Coronavirus 2 by RT PCR: NEGATIVE

## 2019-12-23 LAB — PROTIME-INR
INR: 2 — ABNORMAL HIGH (ref 0.8–1.2)
Prothrombin Time: 22.2 seconds — ABNORMAL HIGH (ref 11.4–15.2)

## 2019-12-23 MED ORDER — ONDANSETRON HCL 4 MG/2ML IJ SOLN: 4.0000 mg | Freq: Four times a day (QID) | INTRAMUSCULAR | Status: DC | PRN

## 2019-12-23 MED ORDER — ISOSORBIDE DINITRATE 10 MG PO TABS
5.0000 mg | ORAL_TABLET | Freq: Every day | ORAL | Status: DC
  Administered 2019-12-24 – 2019-12-27 (×4): 5 mg via ORAL
  Filled 2019-12-23 (×6): qty 0.5

## 2019-12-23 MED ORDER — ENOXAPARIN SODIUM 40 MG/0.4ML ~~LOC~~ SOLN: 40.0000 mg | SUBCUTANEOUS | Status: DC

## 2019-12-23 MED ORDER — LACTATED RINGERS IV SOLN: INTRAVENOUS | Status: DC

## 2019-12-23 MED ORDER — MAGNESIUM HYDROXIDE 400 MG/5ML PO SUSP: 30.0000 mL | Freq: Every day | ORAL | Status: DC | PRN

## 2019-12-23 MED ORDER — TRAZODONE HCL 50 MG PO TABS: 25.0000 mg | ORAL_TABLET | Freq: Every evening | ORAL | Status: DC | PRN

## 2019-12-23 MED ORDER — CALCITRIOL 0.25 MCG PO CAPS
0.5000 ug | ORAL_CAPSULE | Freq: Every day | ORAL | Status: DC
  Administered 2019-12-23 – 2019-12-26 (×3): 0.5 ug via ORAL
  Filled 2019-12-23 (×6): qty 2

## 2019-12-23 MED ORDER — ONDANSETRON HCL 4 MG PO TABS: 4.0000 mg | ORAL_TABLET | Freq: Four times a day (QID) | ORAL | Status: DC | PRN

## 2019-12-23 MED ORDER — SODIUM CHLORIDE 0.9 % IV SOLN
2.0000 g | Freq: Once | INTRAVENOUS | Status: AC
  Administered 2019-12-23: 2 g via INTRAVENOUS
  Filled 2019-12-23: qty 2

## 2019-12-23 MED ORDER — LACTATED RINGERS IV BOLUS
1000.0000 mL | Freq: Once | INTRAVENOUS | Status: AC
  Administered 2019-12-23: 1000 mL via INTRAVENOUS

## 2019-12-23 MED ORDER — ATORVASTATIN CALCIUM 10 MG PO TABS
10.0000 mg | ORAL_TABLET | Freq: Every day | ORAL | Status: DC
  Administered 2019-12-24 – 2019-12-27 (×4): 10 mg via ORAL
  Filled 2019-12-23 (×4): qty 1

## 2019-12-23 MED ORDER — WARFARIN - PHYSICIAN DOSING INPATIENT
Freq: Every day | Status: DC
  Filled 2019-12-23: qty 1

## 2019-12-23 MED ORDER — POLYETHYLENE GLYCOL 3350 17 G PO PACK
17.0000 g | PACK | Freq: Every day | ORAL | Status: DC | PRN
  Administered 2019-12-24: 17 g via ORAL
  Filled 2019-12-23: qty 1

## 2019-12-23 MED ORDER — SODIUM CHLORIDE 0.9 % IV SOLN: INTRAVENOUS | Status: DC

## 2019-12-23 MED ORDER — WARFARIN SODIUM 3 MG PO TABS
3.0000 mg | ORAL_TABLET | Freq: Every day | ORAL | Status: DC
  Administered 2019-12-23 – 2019-12-24 (×2): 3 mg via ORAL
  Filled 2019-12-23 (×4): qty 1

## 2019-12-23 MED ORDER — DONEPEZIL HCL 5 MG PO TABS
5.0000 mg | ORAL_TABLET | Freq: Every day | ORAL | Status: DC
  Administered 2019-12-23 – 2019-12-26 (×3): 5 mg via ORAL
  Filled 2019-12-23 (×5): qty 1

## 2019-12-23 NOTE — H&P (Signed)
[place]   PATIENT NAME: Hailey Lopez    MR#:  [id number]  DATE OF BIRTH:  [birth year]  DATE OF ADMISSION:  12/23/2019  PRIMARY CARE PHYSICIAN: Marda Stalker, PA-C   REQUESTING/REFERRING PHYSICIAN: Nance Pear, MD CHIEF COMPLAINT:   Chief Complaint  Patient presents with  . Shortness of Breath  . Cough  . Fever    HISTORY OF PRESENT ILLNESS:  Hailey Lopez  is a 86 y.o. African-American female with a known history of hypertension, dyslipidemia, atrial fibrillation, DVT and PE on Coumadin and stage IV chronic kidney disease, who presented to the emergency room with acute onset of fever of 101.9 last night.  She was given gentle Tylenol for her fever only last night.  She has been having associated cough productive of yellowish sputum with altered mental status with significant lethargy and generalized weakness today.  She was not able to hold a conversation anymore or to ambulate.  No reported diarrhea and she had no bilious vomitus or hematemesis.  She went to her primary care physician today who was apparently concerned about possible pneumonia.  She has had a history of UTIs in the past.  She has been having urinary frequency per her niece and her urine has been smelling.  She is fairly demented and therefore poor historian and all the history was obtained from her niece.  Upon presentation to the emergency room, temperature was 99.3 with otherwise normal vital signs.  Labs revealed a BUN of 50 and creatinine of 3.75 above his levels 22 and 2.3 on 06/01/2017.  Total bili was 9.3.  Lactic acid was 2.2 and later 3.6.  CBC showed no acute abnormalities.  INR was two on Coumadin with PT of 22.2.  Influenza antigens and COVID-19 PCR came back negative.  Urinalysis was positive UTI.  Two-view chest x-ray showed no acute cardiopulmonary disease. EKG showed sinus rhythm with a rate of 97, first-degree AV block and incomplete right bundle branch block with left anterior  fascicular block.  The patient was given 2 g of IV cefepime and 1 L bolus of IV lactated Ringer followed by an order of 125 mL/h.  She will be admitted to a medical bed for further evaluation and management  PAST MEDICAL HISTORY:   Past Medical History:  Diagnosis Date  . Alzheimer's disease ([place])   . Atrial fibrillation ([place])   . Chronic kidney disease    Stage 4  . CVA (cerebrovascular accident) ([place])   . DVT (deep venous thrombosis) ([place]) 2013   LLE  . Hyperlipidemia   . Hypertension   . Pacemaker   . Pulmonary embolism ([place])     PAST SURGICAL HISTORY:   Past Surgical History:  Procedure Laterality Date  . CATARACT EXTRACTION, BILATERAL    . PACEMAKER IMPLANT  2013    SOCIAL HISTORY:   Social History   Tobacco Use  . Smoking status: Former Research scientist (life sciences)  . Smokeless tobacco: Never Used  Substance Use Topics  . Alcohol use: Not Currently    FAMILY HISTORY:   Family History  Problem Relation Age of Onset  . Dementia Mother   . Heart attack Father   . Stroke Father   . Hypertension Father   . Heart attack Sister   . Heart attack Brother   . Heart attack Brother   . Stroke Brother   . Hypertension Brother   . Diabetes Brother   . Hyperlipidemia Sister   . Dementia Sister  DRUG ALLERGIES:  No Known Allergies  REVIEW OF SYSTEMS:  Per her niece ROS As per history of present illness. All pertinent systems were reviewed above. Constitutional, HEENT, cardiovascular, respiratory, GI, GU, musculoskeletal, neuro, psychiatric, endocrine, integumentary and hematologic systems were reviewed and are otherwise negative/unremarkable except for positive findings mentioned above in the HPI.  Of note no history could be obtained from the patient due to her lethargy and altered mental status.  MEDICATIONS AT HOME:   Prior to Admission medications   Medication Sig Start Date End Date Taking? Authorizing Provider  atorvastatin (LIPITOR) 10 MG tablet Take 10 mg by mouth  daily.   Yes [provider]  calcitRIOL (ROCALTROL) 0.5 MCG capsule Take 0.5 mcg by mouth at bedtime.    Yes [provider]  donepezil (ARICEPT) 5 MG tablet Take 5 mg by mouth at bedtime. 08/08/19  Yes [provider]  isosorbide dinitrate (ISORDIL) 5 MG tablet Take 5 mg by mouth daily.    Yes [provider]  polyethylene glycol (MIRALAX / GLYCOLAX) packet Take 17 g by mouth daily as needed for mild constipation or moderate constipation.    Yes [provider]  warfarin (COUMADIN) 3 MG tablet Take 1 tablet (3 mg total) by mouth daily. Patient taking differently: Take 3 mg by mouth daily at 6 PM.  07/06/19  Yes Skeet Latch, MD      VITAL SIGNS:  Blood pressure 135/68, pulse 80, temperature 99.2 F (37.3 C), temperature source Oral, resp. rate 18, height 5\' 3"  (1.6 m), weight 65.8 kg, SpO2 95 %.  PHYSICAL EXAMINATION:  Physical Exam  GENERAL:  86 y.o.-year-old African-American  female patient lying in the bed with no acute distress.  She was fairly somnolent but arousable and nonverbal. EYES: Pupils equal, round, reactive to light and accommodation. No scleral icterus. Extraocular muscles intact.  HEENT: Head atraumatic, normocephalic. Oropharynx and nasopharynx clear.  NECK:  Supple, no jugular venous distention. No thyroid enlargement, no tenderness.  LUNGS: Normal breath sounds bilaterally, no wheezing, rales,rhonchi or crepitation. No use of accessory muscles of respiration.  CARDIOVASCULAR: Regular rate and rhythm, S1, S2 normal. No murmurs, rubs, or gallops.  ABDOMEN: Soft, nondistended, nontender. Bowel sounds present. No organomegaly or mass.  EXTREMITIES: No pedal edema, cyanosis, or clubbing.  NEUROLOGIC: She had no lateralizing signs of grossly nonfocal exam.   PSYCHIATRIC: The patient is lethargic and somnolent but arousable and nonverbal. SKIN: No obvious rash, lesion, or ulcer.   LABORATORY PANEL:   CBC Recent Labs  Lab  12/23/19 1253  WBC 10.5  HGB 14.3  HCT 43.5  PLT 244   ------------------------------------------------------------------------------------------------------------------  Chemistries  Recent Labs  Lab 12/23/19 1253  NA 143  K 4.7  CL 107  CO2 25  GLUCOSE 131*  BUN 50*  CREATININE 3.75*  CALCIUM 10.2  AST 27  ALT 16  ALKPHOS 44  BILITOT 1.3*   ------------------------------------------------------------------------------------------------------------------  Cardiac Enzymes No results for input(s): TROPONINI in the last 168 hours. ------------------------------------------------------------------------------------------------------------------  RADIOLOGY:  DG Chest 2 View  Result Date: 12/23/2019 CLINICAL DATA:  Cough.  Shortness of breath.  Low-grade fever. EXAM: CHEST - 2 VIEW COMPARISON:  None. FINDINGS: Cardiac silhouette borderline enlarged. No mediastinal or hilar masses or evidence of adenopathy. Lungs are hyperexpanded, but clear. Left anterior chest wall sequential pacemaker is well positioned. Skeletal structures are demineralized but intact. IMPRESSION: No active cardiopulmonary disease. Electronically Signed   By: Lajean Manes M.D.   On: 12/23/2019 13:27  IMPRESSION AND PLAN:  1.  UTI with subsequent metabolic encephalopathy and generalized weakness.  At this time here she has no criteria for sepsis yet.  The high temperature she had was at home and last night but today it is 99.5 without reported Tylenol taken earlier today. -The patient will be admitted to a medically medical monitored. -She will be hydrated with IV normal saline. -We will continue antibiotic therapy with IV Rocephin. -We will follow urine and blood cultures. -We will monitor mental status.  2.  Acute kidney injury superimposed on stage IV chronic kidney disease. -The patient will be hydrated with IV normal saline and will follow BMP.  3.  Dyslipidemia. -We will continue statin  therapy.  4.  Dementia. -We will continue Aricept.  5.  History of PE, DVT and atrial fibrillation on Coumadin. -We will continue Coumadin and monitor INR that is currently therapeutic.  6.  DVT prophylaxis. -We will continue Coumadin.   All the records are reviewed and case discussed with ED provider. The plan of care was discussed in details with the patient (and family). I answered all questions. The patient agreed to proceed with the above mentioned plan. Further management will depend upon hospital course.   CODE STATUS: Full code  Status is: Inpatient  Remains inpatient appropriate because:Altered mental status, Ongoing diagnostic testing needed not appropriate for outpatient work up, Unsafe d/c plan, IV treatments appropriate due to intensity of illness or inability to take PO and Inpatient level of care appropriate due to severity of illness   Dispo: The patient is from: Home              Anticipated d/c is to: Home              Anticipated d/c date is: 2 days              Patient currently is not medically stable to d/c.   TOTAL TIME TAKING CARE OF THIS PATIENT: 60 minutes.    Christel Mormon M.D on 12/23/2019 at 8:20 PM  [facility]   From 7 PM-7 AM, contact night-coverage [email or web address]  CC: Primary care physician; Marda Stalker, PA-C

## 2019-12-23 NOTE — ED Provider Notes (Signed)
[facility] Emergency Department Provider Note   ____________________________________________   I have reviewed the triage vital signs and the nursing notes.   HISTORY  Chief Complaint Shortness of Breath, Cough, and Fever   History limited by and level 5 caveat due to dementia: history obtained from family   HPI Hailey Lopez is a 86 y.o. female who presents to the emergency department today because of concerns for fevers, shortness of breath and increased weakness.  Patient does have a baseline of dementia although family states she normally can carry on a conversation.  Starting yesterday however family noticed that the patient was having some shortness of breath and increasingly weak.  She was then no longer able to hold a conversation.  Had measured fevers of 101.9 last night.  Went to primary care doctor's office today and they were concerned for about possible pneumonia.  Family states she is also had urinary tract infections in the past.   Records reviewed. Per medical record review patient has a history of alzheimer's disease.  Past Medical History:  Diagnosis Date  . Alzheimer's disease ([place])   . Atrial fibrillation ([place])   . Chronic kidney disease    Stage 4  . CVA (cerebrovascular accident) ([place])   . DVT (deep venous thrombosis) ([place]) 2013   LLE  . Hyperlipidemia   . Hypertension   . Pacemaker   . Pulmonary embolism [facility])     Patient Active Problem List   Diagnosis Date Noted  . Long term (current) use of anticoagulants 07/31/2018  . SSS (sick sinus syndrome) ([place]) 11/25/2017  . Paroxysmal atrial fibrillation ([place]) 11/25/2017  . Pacemaker 11/25/2017  . History of pulmonary embolism 11/25/2017  . CKD (chronic kidney disease) stage 4, GFR 15-29 ml/min ([facility]) 11/25/2017  . Essential hypertension 11/25/2017  . Hypercholesterolemia 11/25/2017    Past Surgical History:  Procedure Laterality Date  . CATARACT EXTRACTION, BILATERAL    .  PACEMAKER IMPLANT  2013    Prior to Admission medications   Medication Sig Start Date End Date Taking? Authorizing Provider  atorvastatin (LIPITOR) 10 MG tablet Take 10 mg by mouth daily.    [provider]  calcitRIOL (ROCALTROL) 0.5 MCG capsule Take 0.5 mcg by mouth daily.    [provider]  donepezil (ARICEPT) 5 MG tablet Take 5 mg by mouth at bedtime. 08/08/19   [provider]  hyoscyamine (LEVSIN, ANASPAZ) 0.125 MG tablet TAKE 1 TABLET BY MOUTH 4 TIMES DAILY AS NEEDED 09/13/17   [provider]  isosorbide dinitrate (ISORDIL) 5 MG tablet Take 5 mg by mouth daily with lunch.    [provider]  polyethylene glycol (MIRALAX / GLYCOLAX) packet Take 17 g by mouth daily.    [provider]  warfarin (COUMADIN) 3 MG tablet Take 1 tablet (3 mg total) by mouth daily. 07/06/19   Skeet Latch, MD    Allergies Patient has no known allergies.  Family History  Problem Relation Age of Onset  . Dementia Mother   . Heart attack Father   . Stroke Father   . Hypertension Father   . Heart attack Sister   . Heart attack Brother   . Heart attack Brother   . Stroke Brother   . Hypertension Brother   . Diabetes Brother   . Hyperlipidemia Sister   . Dementia Sister     Social History Social History   Tobacco Use  . Smoking status: Former Research scientist (life sciences)  . Smokeless tobacco: Never Used  Substance Use Topics  . Alcohol use: Not Currently  . Drug use: Never    Review of Systems Unable to obtain reliable ROS secondary to dementia ____________________________________________   PHYSICAL EXAM:  VITAL SIGNS: ED Triage Vitals [12/23/19 1250]  Enc Vitals Group     BP 136/76     Pulse Rate 96     Resp 16     Temp 99.2 F (37.3 C)     Temp Source Oral     SpO2 93 %     Weight 145 lb (65.8 kg)     Height 5\' 3"  (1.6 m)   Constitutional: Awake and alert. Non verbal. Eyes: Conjunctivae are normal.  ENT      Head: Normocephalic and  atraumatic.      Nose: No congestion/rhinnorhea.      Mouth/Throat: Mucous membranes are moist.      Neck: No stridor. Hematological/Lymphatic/Immunilogical: No cervical lymphadenopathy. Cardiovascular: Normal rate, regular rhythm.  No murmurs, rubs, or gallops.  Respiratory: Normal respiratory effort without tachypnea nor retractions. Breath sounds are clear and equal bilaterally. No wheezes/rales/rhonchi. Gastrointestinal: Soft and non tender. No rebound. No guarding.  Genitourinary: Deferred Musculoskeletal: Normal range of motion in all extremities. No lower extremity edema. Neurologic:  Dementia. Moving all extremities.  Skin:  Skin is warm, dry and intact. No rash noted.  ____________________________________________    LABS (pertinent positives/negatives)  Lactic acid 2.2 CBC wbc 10.5, hgb 14.3, plt 244 CMP na 143, k 4.7, glu 131, cr 3.75 UA cloudy, large leukocytes, 11-20 rbc, >50 wbc, rare bacteria ____________________________________________   EKG  I, Nance Pear, attending physician, personally viewed and interpreted this EKG  EKG Time: 1258 Rate: 97 Rhythm: sinus rhythm with 1st degree av block Axis: left axis deviation Intervals: qtc 403 QRS: Incomplete RBBB, LAFB ST changes: no st elevation Impression: abnormal ekg   ____________________________________________    RADIOLOGY  CXR No active cardiopulmonary disease  ____________________________________________   PROCEDURES  Procedures  ____________________________________________   INITIAL IMPRESSION / ASSESSMENT AND PLAN / ED COURSE  Pertinent labs & imaging results that were available during my care of the patient were reviewed by me and considered in my medical decision making (see chart for details).   Presented to the emergency department today brought in by family because of concerns for increasing weakness confusion and shortness of breath.  Has been going on for 2 days.  On exam  patient is awake and alert although nonverbal.  Breath sounds were clear forming.  Work-up was notable for elevated lactic acid level.  Patient's urine is concerning for infection.  Chest x-ray without pneumonia.  At this point I do think urinary tract infection could explain the patient's weakness.  Additionally I do believe that she is dehydrated given lactic acidosis and elevation of creatinine.  Discussed findings and plan for admission with patient's family.  ____________________________________________   FINAL CLINICAL IMPRESSION(S) / ED DIAGNOSES  Final diagnoses:  Lower urinary tract infection  Weakness     Note: This dictation was prepared with Dragon dictation. Any transcriptional errors that result from this process are unintentional     Nance Pear, MD 12/23/19 [phone]

## 2019-12-23 NOTE — ED Notes (Signed)
Patient given water and apple juice.

## 2019-12-23 NOTE — ED Notes (Signed)
Patient comes in from PCP for SOB, fever, 1 episode of vomiting, and disorientation. Per caregiver at bedside, patient is no longer answering yes or no questions like she normally does at home. Patient does follow commands and has history of dementia.

## 2019-12-23 NOTE — ED Triage Notes (Addendum)
Pt arrived via POV from home with niece who is the patient's caregiver, states last night the patient started running a fever of 101.9, shortness of breath, productive cough and vomiting x 1.  Pt given tylenol for fever last night.  No fever today.  Pt has hx of dementia. No COVID contacts.   Pt was seen at [facility] PCP this morning and was told she probably had pneumonia.

## 2019-12-23 NOTE — ED Notes (Signed)
Per [place], MD- finish LR infusion at 222mL/hr then start NS infusion at 153mL/hr.

## 2019-12-23 NOTE — ED Notes (Signed)
Per pts family give all meds together due to patient requiring applesauce, waiting for coumadin and other meds from main pharm.

## 2019-12-24 DIAGNOSIS — E872 Acidosis, unspecified: Secondary | ICD-10-CM

## 2019-12-24 DIAGNOSIS — N179 Acute kidney failure, unspecified: Secondary | ICD-10-CM

## 2019-12-24 DIAGNOSIS — N3000 Acute cystitis without hematuria: Secondary | ICD-10-CM

## 2019-12-24 DIAGNOSIS — N170 Acute kidney failure with tubular necrosis: Secondary | ICD-10-CM

## 2019-12-24 DIAGNOSIS — N184 Chronic kidney disease, stage 4 (severe): Secondary | ICD-10-CM

## 2019-12-24 DIAGNOSIS — G9341 Metabolic encephalopathy: Secondary | ICD-10-CM

## 2019-12-24 LAB — PROTIME-INR
INR: 2.5 — ABNORMAL HIGH (ref 0.8–1.2)
Prothrombin Time: 25.8 seconds — ABNORMAL HIGH (ref 11.4–15.2)

## 2019-12-24 LAB — BASIC METABOLIC PANEL
Anion gap: 12 (ref 5–15)
BUN: 45 mg/dL — ABNORMAL HIGH (ref 8–23)
CO2: 22 mmol/L (ref 22–32)
Calcium: 9.1 mg/dL (ref 8.9–10.3)
Chloride: 111 mmol/L (ref 98–111)
Creatinine, Ser: 3.02 mg/dL — ABNORMAL HIGH (ref 0.44–1.00)
GFR, Estimated: 14 mL/min — ABNORMAL LOW (ref >60)
Glucose, Bld: 93 mg/dL (ref 70–99)
Potassium: 4 mmol/L (ref 3.5–5.1)
Sodium: 145 mmol/L (ref 135–145)

## 2019-12-24 LAB — LACTIC ACID, PLASMA: Lactic Acid, Venous: 3.2 mmol/L (ref 0.5–1.9)

## 2019-12-24 MED ORDER — SODIUM CHLORIDE 0.9 % IV SOLN
1.0000 g | INTRAVENOUS | Status: DC
  Administered 2019-12-24 – 2019-12-26 (×3): 1 g via INTRAVENOUS
  Filled 2019-12-24 (×4): qty 1

## 2019-12-24 NOTE — Progress Notes (Signed)
PROGRESS NOTE    Hailey Lopez  MGN:[id number] DOB: 08-08-1929 DOA: 12/23/2019 PCP: Marda Stalker, PA-C   Chief complaint. Altered mental status Brief Narrative:  Hailey Lopez  is a 86 y.o. African-American female with a known history of hypertension, dyslipidemia, atrial fibrillation, DVT and PE on Coumadin and stage IV chronic kidney disease, who presented to the emergency room with acute onset of fever of 101.9 last night.  She has been having associated cough productive of yellowish sputum with altered mental status with significant lethargy and generalized weakness today. She went to her primary care physician today who was apparently concerned about possible pneumonia.  She has had a history of UTIs in the past.  She has been having urinary frequency per her niece and her urine has been smelling.    Patient had  a peak lactic acid 4.6, temperature 99.2, HR 96, abnormal urine.  No leukocytosis.  Chest x-ray has no acute changes.  Patient diagnosed with acute urinary tract infection, started on cefepime.  Assessment & Plan:   Active Problems:   UTI (urinary tract infection)  #1.  Metabolic cephalopathy. Urinary tract infection. Lactic acidosis. Dementia. Patient at this time does not meet sepsis criteria.  Condition most likely due to urinary tract infection.  Chest x-ray did not show pneumonia.  At this point, patient still has a poor appetite, will continue some IV fluids.  Continue cefepime. Pending urine culture and blood cultures.  2.  Acute kidney injury on chronic kidney disease stage IV. Continue gentle rehydration.  3.  Paroxysmal atrial fibrillation, history of PE and DVT. Continued on warfarin.    DVT prophylaxis: Coumadin Code Status:  Family Communication: Daughter at bedside Disposition Plan:  .   Status is: Inpatient  Remains inpatient appropriate because:Inpatient level of care appropriate due to severity of illness   Dispo: The patient is from:  Home              Anticipated d/c is to: Home              Anticipated d/c date is: 2 days              Patient currently is not medically stable to d/c.        I/O last 3 completed shifts: In: 1873 [I.V.:441.2; IV Piggyback:1431.8] Out: -  Total I/O In: 240 [P.O.:240] Out: 350 [Urine:350]     Consultants:   None  Procedures: None  Antimicrobials:  Cefepime  Subjective: Patient is very confused.  Otherwise she is comfortable. She does not have any short of breath or cough. No hypoxia. No abdominal pain or nausea vomiting. She does not complains of dysuria hematuria. Further review of system not possible due to dementia  Objective: Vitals:   12/24/19 0345 12/24/19 0730 12/24/19 1126 12/24/19 1132  BP: (!) 143/69 (!) 140/59 (!) 127/55 (!) 133/52  Pulse: 66 70 (!) 59 60  Resp: 20 18 18    Temp: 99.2 F (37.3 C) 98.8 F (37.1 C) 98.3 F (36.8 C) 98.3 F (36.8 C)  TempSrc: Oral Oral Axillary   SpO2: 99% 100% 100%   Weight:      Height:        Intake/Output Summary (Last 24 hours) at 12/24/2019 1428 Last data filed at 12/24/2019 1013 Gross per 24 hour  Intake 2112.95 ml  Output 350 ml  Net 1762.95 ml   Filed Weights   12/23/19 1250 12/23/19 2343  Weight: 65.8 kg 62.1 kg    Examination:  General exam: Appears calm and comfortable  Respiratory system: Clear to auscultation. Respiratory effort normal. Cardiovascular system: S1 & S2 heard, RRR. No JVD, murmurs, rubs, gallops or clicks. No pedal edema. Gastrointestinal system: Abdomen is nondistended, soft and nontender. No organomegaly or masses felt. Normal bowel sounds heard. Central nervous system: Alert and oriented x1. No focal neurological deficits. Extremities: Symmetric 5 x 5 power. Skin: No rashes, lesions or ulcers Psychiatry:  Mood & affect appropriate.     Data Reviewed: I have personally reviewed following labs and imaging studies  CBC: Recent Labs  Lab 12/23/19 1253  WBC 10.5    NEUTROABS 7.9*  HGB 14.3  HCT 43.5  MCV 93.3  PLT 102   Basic Metabolic Panel: Recent Labs  Lab 12/23/19 1253 12/24/19 0320  NA 143 145  K 4.7 4.0  CL 107 111  CO2 25 22  GLUCOSE 131* 93  BUN 50* 45*  CREATININE 3.75* 3.02*  CALCIUM 10.2 9.1   GFR: Estimated Creatinine Clearance: 10.9 mL/min (A) (by C-G formula based on SCr of 3.02 mg/dL (H)). Liver Function Tests: Recent Labs  Lab 12/23/19 1253  AST 27  ALT 16  ALKPHOS 44  BILITOT 1.3*  PROT 8.0  ALBUMIN 3.5   No results for input(s): LIPASE, AMYLASE in the last 168 hours. No results for input(s): AMMONIA in the last 168 hours. Coagulation Profile: Recent Labs  Lab 12/23/19 1253 12/24/19 0320  INR 2.0* 2.5*   Cardiac Enzymes: No results for input(s): CKTOTAL, CKMB, CKMBINDEX, TROPONINI in the last 168 hours. BNP (last 3 results) No results for input(s): PROBNP in the last 8760 hours. HbA1C: No results for input(s): HGBA1C in the last 72 hours. CBG: No results for input(s): GLUCAP in the last 168 hours. Lipid Profile: No results for input(s): CHOL, HDL, LDLCALC, TRIG, CHOLHDL, LDLDIRECT in the last 72 hours. Thyroid Function Tests: No results for input(s): TSH, T4TOTAL, FREET4, T3FREE, THYROIDAB in the last 72 hours. Anemia Panel: No results for input(s): VITAMINB12, FOLATE, FERRITIN, TIBC, IRON, RETICCTPCT in the last 72 hours. Sepsis Labs: Recent Labs  Lab 12/23/19 1253 12/23/19 1701 12/23/19 1954 12/23/19 2349  LATICACIDVEN 2.2* 3.6* 4.6* 3.2*    Recent Results (from the past 240 hour(s))  Resp Panel by RT-PCR (Flu A&B, Covid) Nasopharyngeal Swab     Status: None   Collection Time: 12/23/19  5:01 PM   Specimen: Nasopharyngeal Swab; Nasopharyngeal(NP) swabs in vial transport medium  Result Value Ref Range Status   SARS Coronavirus 2 by RT PCR NEGATIVE NEGATIVE Final    Comment: (NOTE) SARS-CoV-2 target nucleic acids are NOT DETECTED.  The SARS-CoV-2 RNA is generally detectable in upper  respiratory specimens during the acute phase of infection. The lowest concentration of SARS-CoV-2 viral copies this assay can detect is 138 copies/mL. A negative result does not preclude SARS-Cov-2 infection and should not be used as the sole basis for treatment or other patient management decisions. A negative result may occur with  improper specimen collection/handling, submission of specimen other than nasopharyngeal swab, presence of viral mutation(s) within the areas targeted by this assay, and inadequate number of viral copies(<138 copies/mL). A negative result must be combined with clinical observations, patient history, and epidemiological information. The expected result is Negative.  Fact Sheet for Patients:  EntrepreneurPulse.com.au  Fact Sheet for Healthcare Providers:  IncredibleEmployment.be  This test is no t yet approved or cleared by the [place] FDA and  has been authorized for detection and/or diagnosis of SARS-CoV-2 by FDA  under an Emergency Use Authorization (EUA). This EUA will remain  in effect (meaning this test can be used) for the duration of the COVID-19 declaration under Section 564(b)(1) of the Act, 21 U.S.C.section 360bbb-3(b)(1), unless the authorization is terminated  or revoked sooner.       Influenza A by PCR NEGATIVE NEGATIVE Final   Influenza B by PCR NEGATIVE NEGATIVE Final    Comment: (NOTE) The Xpert Xpress SARS-CoV-2/FLU/RSV plus assay is intended as an aid in the diagnosis of influenza from Nasopharyngeal swab specimens and should not be used as a sole basis for treatment. Nasal washings and aspirates are unacceptable for Xpert Xpress SARS-CoV-2/FLU/RSV testing.  Fact Sheet for Patients: EntrepreneurPulse.com.au  Fact Sheet for Healthcare Providers: IncredibleEmployment.be  This test is not yet approved or cleared by the [place] FDA and has been  authorized for detection and/or diagnosis of SARS-CoV-2 by FDA under an Emergency Use Authorization (EUA). This EUA will remain in effect (meaning this test can be used) for the duration of the COVID-19 declaration under Section 564(b)(1) of the Act, 21 U.S.C. section 360bbb-3(b)(1), unless the authorization is terminated or revoked.  Performed at [facility], [address], [place], [place] [address]          Radiology Studies: DG Chest 2 View  Result Date: 12/23/2019 CLINICAL DATA:  Cough.  Shortness of breath.  Low-grade fever. EXAM: CHEST - 2 VIEW COMPARISON:  None. FINDINGS: Cardiac silhouette borderline enlarged. No mediastinal or hilar masses or evidence of adenopathy. Lungs are hyperexpanded, but clear. Left anterior chest wall sequential pacemaker is well positioned. Skeletal structures are demineralized but intact. IMPRESSION: No active cardiopulmonary disease. Electronically Signed   By: Lajean Manes M.D.   On: 12/23/2019 13:27        Scheduled Meds: . atorvastatin  10 mg Oral Daily  . calcitRIOL  0.5 mcg Oral QHS  . donepezil  5 mg Oral QHS  . isosorbide dinitrate  5 mg Oral Daily  . warfarin  3 mg Oral q1600  . Warfarin - Physician Dosing Inpatient   Does not apply q1600   Continuous Infusions: . sodium chloride 100 mL/hr at 12/24/19 0949  . ceFEPime (MAXIPIME) IV       LOS: 1 day    Time spent: 26 minutes    Sharen Hones, MD [facility]   To contact the attending provider between 7A-7P or the covering provider during after hours 7P-7A, please log into the web site [email or web address] and access using universal [place] password for that web site. If you do not have the password, please call the hospital operator.  12/24/2019, 2:28 PM

## 2019-12-24 NOTE — Evaluation (Signed)
Occupational Therapy Evaluation Patient Details Name: Hailey Lopez MRN: [id number] DOB: 1929-02-02 Today's Date: 12/24/2019    History of Present Illness 86 y.o. African-American female with a known history of hypertension, dyslipidemia, atrial fibrillation, DVT and PE on Coumadin and stage IV chronic kidney disease, who presented to the emergency room with acute onset of fever of 101.9 last night.  She has been having associated cough productive of yellowish sputum with altered mental status with significant lethargy and generalized weakness today. She went to her primary care physician today who was apparently concerned about possible pneumonia.  She has had a history of UTIs in the past   Clinical Impression   Patient presenting with decreased I in self care,balance, functional mobility/transfer, safety awareness, and endurance.Pt has cognitive deficits at baseline from dementia. Pt has family caregiver who provides 24/7 supervision and assist as needed. No family present during this session. Pt very withdrawn and needing hand over had assistance for self care tasks. She followed commands 25% of the time. She was resistive for mobility this session and when therapist assisted her with bringing LEs off bed she picked them back up independently and put herself back into bed.  Per RN, her family reported she does not use AD at baseline and they provide her with CGA for safety with standing tasks and ambulating PTA.  Patient will benefit from acute OT to increase overall independence in the areas of ADLs, functional mobility, and safety awareness in order to safely discharge home with caregiver.   Follow Up Recommendations  No OT follow up;Supervision/Assistance - 24 hour    Equipment Recommendations  None recommended by OT       Precautions / Restrictions Precautions Precautions: Fall      Mobility Bed Mobility Overal bed mobility: Needs Assistance       General bed mobility comments:  Pt is resistive to movement. Therapist assists pt with getting LEs to EOB and pt picks them back up into bed without assistance.    Transfers    General transfer comment: deferred secondary to pt refusal        ADL either performed or assessed with clinical judgement   ADL Overall ADL's : Needs assistance/impaired        General ADL Comments: Pt is nonverbal and resistive to therapist during this session. Pt needing hand over hand assistance to perform self care tasks. RN reports pt needing to be feed today to consume calories. Pt allowing therapist to assist with personal needs this session.     Vision Patient Visual Report: No change from baseline              Pertinent Vitals/Pain Pain Assessment: Faces Faces Pain Scale: No hurt        Extremity/Trunk Assessment Upper Extremity Assessment Upper Extremity Assessment: Overall WFL for tasks assessed   Lower Extremity Assessment Lower Extremity Assessment: Overall WFL for tasks assessed          Cognition Arousal/Alertness: Awake/alert Behavior During Therapy: WFL for tasks assessed/performed Overall Cognitive Status: History of cognitive impairments - at baseline        General Comments: Pt with dementia at baseline and family caregiver performs 24/7 assistance. Pt is nonverbal duing this session              Home Living Family/patient expects to be discharged to:: Private residence Living Arrangements: Other relatives Available Help at Discharge: Family;Available 24 hours/day      Additional Comments: Per RN, pt's caregiver is  her niece who reports pt does not use AD at home and is CGA for ambulation and self care tasks. Pt is non verbal during this session and resistive to unfamilar therapist               OT Problem List: Decreased strength;Decreased activity tolerance;Decreased safety awareness;Impaired balance (sitting and/or standing);Decreased knowledge of use of DME or AE      OT  Treatment/Interventions: Self-care/ADL training;Therapeutic exercise;Therapeutic activities;DME and/or AE instruction;Patient/family education;Balance training;Cognitive remediation/compensation    OT Goals(Current goals can be found in the care plan section) Acute Rehab OT Goals Patient Stated Goal: none stated OT Goal Formulation: Patient unable to participate in goal setting Time For Goal Achievement: 01/07/20 Potential to Achieve Goals: Fair ADL Goals Pt Will Perform Grooming: with min guard assist;standing Pt Will Transfer to Toilet: with min guard assist;ambulating Pt Will Perform Toileting - Clothing Manipulation and hygiene: with min guard assist;sit to/from stand Pt Will Perform Tub/Shower Transfer: with min guard assist;ambulating  OT Frequency: Min 1X/week   Barriers to D/C:    none known at this time          AM-PAC OT "6 Clicks" Daily Activity     Outcome Measure Help from another person eating meals?: A Lot Help from another person taking care of personal grooming?: A Lot Help from another person toileting, which includes using toliet, bedpan, or urinal?: Total Help from another person bathing (including washing, rinsing, drying)?: A Lot Help from another person to put on and taking off regular upper body clothing?: A Lot Help from another person to put on and taking off regular lower body clothing?: A Lot 6 Click Score: 11   End of Session Nurse Communication: Mobility status;Other (comment) (behavior)  Activity Tolerance: Other (comment) (limited by cognition) Patient left: in bed;with call bell/phone within reach;with bed alarm set  OT Visit Diagnosis: Unsteadiness on feet (R26.81);Muscle weakness (generalized) (M62.81)                Time: 5537-4827 OT Time Calculation (min): 15 min Charges:  OT General Charges $OT Visit: 1 Visit OT Evaluation $OT Eval Low Complexity: 1 Low  Darleen Crocker, MS, OTR/L , CBIS ascom [phone]  12/24/19, 5:18 PM

## 2019-12-25 DIAGNOSIS — I10 Essential (primary) hypertension: Secondary | ICD-10-CM

## 2019-12-25 DIAGNOSIS — E872 Acidosis: Secondary | ICD-10-CM

## 2019-12-25 DIAGNOSIS — I480 Paroxysmal atrial fibrillation: Secondary | ICD-10-CM

## 2019-12-25 LAB — CBC WITH DIFFERENTIAL/PLATELET
Abs Immature Granulocytes: 0.01 10*3/uL (ref 0.00–0.07)
Basophils Absolute: 0 10*3/uL (ref 0.0–0.1)
Basophils Relative: 0 %
Eosinophils Absolute: 0.2 10*3/uL (ref 0.0–0.5)
Eosinophils Relative: 3 %
HCT: 31.8 % — ABNORMAL LOW (ref 36.0–46.0)
Hemoglobin: 10.8 g/dL — ABNORMAL LOW (ref 12.0–15.0)
Immature Granulocytes: 0 %
Lymphocytes Relative: 26 %
Lymphs Abs: 1.3 10*3/uL (ref 0.7–4.0)
MCH: 31.3 pg (ref 26.0–34.0)
MCHC: 34 g/dL (ref 30.0–36.0)
MCV: 92.2 fL (ref 80.0–100.0)
Monocytes Absolute: 0.5 10*3/uL (ref 0.1–1.0)
Monocytes Relative: 11 %
Neutro Abs: 2.8 10*3/uL (ref 1.7–7.7)
Neutrophils Relative %: 60 %
Platelets: 188 10*3/uL (ref 150–400)
RBC: 3.45 MIL/uL — ABNORMAL LOW (ref 3.87–5.11)
RDW: 14 % (ref 11.5–15.5)
WBC: 4.8 10*3/uL (ref 4.0–10.5)
nRBC: 0 % (ref 0.0–0.2)

## 2019-12-25 LAB — BASIC METABOLIC PANEL
Anion gap: 6 (ref 5–15)
BUN: 35 mg/dL — ABNORMAL HIGH (ref 8–23)
CO2: 22 mmol/L (ref 22–32)
Calcium: 8 mg/dL — ABNORMAL LOW (ref 8.9–10.3)
Chloride: 113 mmol/L — ABNORMAL HIGH (ref 98–111)
Creatinine, Ser: 2.42 mg/dL — ABNORMAL HIGH (ref 0.44–1.00)
GFR, Estimated: 19 mL/min — ABNORMAL LOW (ref >60)
Glucose, Bld: 91 mg/dL (ref 70–99)
Potassium: 4 mmol/L (ref 3.5–5.1)
Sodium: 141 mmol/L (ref 135–145)

## 2019-12-25 LAB — PROTIME-INR
INR: 2.8 — ABNORMAL HIGH (ref 0.8–1.2)
Prothrombin Time: 28.6 seconds — ABNORMAL HIGH (ref 11.4–15.2)

## 2019-12-25 LAB — MAGNESIUM: Magnesium: 2 mg/dL (ref 1.7–2.4)

## 2019-12-25 MED ORDER — WARFARIN SODIUM 1 MG PO TABS
1.0000 mg | ORAL_TABLET | Freq: Once | ORAL | Status: AC
  Filled 2019-12-25: qty 1

## 2019-12-25 MED ORDER — LACTULOSE 10 GM/15ML PO SOLN
20.0000 g | Freq: Once | ORAL | Status: AC
  Administered 2019-12-25: 20 g via ORAL
  Filled 2019-12-25: qty 30

## 2019-12-25 MED ORDER — WARFARIN - PHARMACIST DOSING INPATIENT: Freq: Every day | Status: DC

## 2019-12-25 NOTE — Evaluation (Signed)
Physical Therapy Evaluation Patient Details Name: Hailey Lopez MRN: [id number] DOB: [birth year] Today's Date: 12/25/2019   History of Present Illness   Patient is a 86 year old female with UTI with subsequent metabolic encephalopathy and generalized weakness, acute kidney injury superimposed on stage IV chronic kidney disease. History of dementia, PE, DVT, atrial fibrillation.   Clinical Impression  PT evaluation completed. Caregiver present throughout session and patient is much more participatory with mobility efforts with the caregiver in the room. Patient needed assistance to sit up on edge of bed but likely due to resisting at times with mobility efforts. Patient stand with supervision with cues for safety and task initiation. Patient ambulates with hand held assistance a short distance in the room without gross loss of balance. Caregiver in room reports she wants to bring patient home and increase home services if appropriate. Recommend return to previous environment with HHPT and supervision with mobility. PT will continue to follow to maximize independence and address functional limitations listed below.     Follow Up Recommendations Home health PT;Supervision for mobility/OOB    Equipment Recommendations  None recommended by PT    Recommendations for Other Services       Precautions / Restrictions Precautions Precautions: Fall Restrictions Weight Bearing Restrictions: No      Mobility  Bed Mobility Overal bed mobility: Needs Assistance Bed Mobility: Supine to Sit     Supine to sit: Max assist     General bed mobility comments: Max A for trunk support. Patient is resistive at times with mobility efforts and provides more active effort with caregiver present     Transfers Overall transfer level: Needs assistance   Transfers: Sit to/from Stand Sit to Stand: Supervision         General transfer comment: supervision for safety. maximal cues for participation for  standing   Ambulation/Gait Ambulation/Gait assistance: Min guard Gait Distance (Feet): 10 Feet Assistive device: 1 person hand held assist Gait Pattern/deviations: Narrow base of support Gait velocity: decreased    General Gait Details: with encouragement from PT and from caregiver, patient ambulated short distance in room with steady gait, hand held assistance. Cues for attention to task and particiaption. caregiver reports patient is usually ambultory with encouragement and hand held assistance at home   Stairs            Wheelchair Mobility    Modified Rankin (Stroke Patients Only)       Balance Overall balance assessment: Needs assistance Sitting-balance support: Feet supported Sitting balance-Leahy Scale: Good     Standing balance support: Bilateral upper extremity supported (hand held assistance ) Standing balance-Leahy Scale: Fair Standing balance comment: with hand held assistance, no loss of balance                              Pertinent Vitals/Pain Pain Assessment: Faces Faces Pain Scale: No hurt    Home Living Family/patient expects to be discharged to:: Private residence Living Arrangements: Other relatives Available Help at Discharge: Family;Available 24 hours/day                  Prior Function Level of Independence: Needs assistance         Comments: patient ambulates with HHA from caregiver and requires assistance with ADLs at baseline. History of dementia      Hand Dominance        Extremity/Trunk Assessment   Upper Extremity Assessment Upper Extremity Assessment:  Defer to OT evaluation    Lower Extremity Assessment Lower Extremity Assessment: Generalized weakness       Communication   Communication:  (minimally verbal during session, history of dementia )  Cognition Arousal/Alertness: Awake/alert Behavior During Therapy:  (resistive at times with mobility efforts initially ) Overall Cognitive Status: History  of cognitive impairments - at baseline                                 General Comments: patient performs best with caregiver giving direction for mobility efforts.       General Comments      Exercises     Assessment/Plan    PT Assessment Patient needs continued PT services  PT Problem List Decreased strength;Decreased activity tolerance;Decreased balance;Decreased mobility;Decreased safety awareness;Decreased cognition       PT Treatment Interventions Gait training;Functional mobility training;Therapeutic activities;Therapeutic exercise;Balance training    PT Goals (Current goals can be found in the Care Plan section)  Acute Rehab PT Goals Patient Stated Goal: patient unable to state  PT Goal Formulation: Patient unable to participate in goal setting Time For Goal Achievement: 01/08/20 Potential to Achieve Goals: Fair    Frequency Min 2X/week   Barriers to discharge        Co-evaluation               AM-PAC PT "6 Clicks" Mobility  Outcome Measure Help needed turning from your back to your side while in a flat bed without using bedrails?: None Help needed moving from lying on your back to sitting on the side of a flat bed without using bedrails?: A Lot Help needed moving to and from a bed to a chair (including a wheelchair)?: A Little Help needed standing up from a chair using your arms (e.g., wheelchair or bedside chair)?: A Little Help needed to walk in hospital room?: A Little Help needed climbing 3-5 steps with a railing? : A Little 6 Click Score: 18    End of Session   Activity Tolerance: Patient tolerated treatment well Patient left: with family/visitor present (sitting up on edge of bed per caregiver request) Nurse Communication: Mobility status PT Visit Diagnosis: Muscle weakness (generalized) (M62.81)    Time: 2956-2130 PT Time Calculation (min) (ACUTE ONLY): 15 min   Charges:   PT Evaluation $PT Eval Moderate Complexity: 1  Mod PT Treatments $Therapeutic Activity: 8-22 mins        Minna Merritts, PT, MPT   Percell Locus 12/25/2019, 1:25 PM

## 2019-12-25 NOTE — Consult Note (Signed)
ANTICOAGULATION CONSULT NOTE - Initial Consult  Pharmacy Consult for wafarin management Indication: atrial fibrillation  No Known Allergies  Patient Measurements: Height: 5\' 3"  (160 cm) Weight: 62.1 kg (136 lb 14.4 oz) IBW/kg (Calculated) : 52.4   Vital Signs: Temp: 98 F (36.7 C) (12/07 1158) Temp Source: Oral (12/07 1158) BP: 141/65 (12/07 1158) Pulse Rate: 62 (12/07 1158)  Labs: Recent Labs    12/23/19 1253 12/24/19 0320 12/25/19 0443  HGB 14.3  --  10.8*  HCT 43.5  --  31.8*  PLT 244  --  188  LABPROT 22.2* 25.8* 28.6*  INR 2.0* 2.5* 2.8*  CREATININE 3.75* 3.02* 2.42*    Estimated Creatinine Clearance: 13.5 mL/min (A) (by C-G formula based on SCr of 2.42 mg/dL (H)).   Medical History: Past Medical History:  Diagnosis Date  . Alzheimer's disease ([place])   . Atrial fibrillation ([place])   . Chronic kidney disease    Stage 4  . CVA (cerebrovascular accident) ([place])   . DVT (deep venous thrombosis) ([place]) 2013   LLE  . Hyperlipidemia   . Hypertension   . Pacemaker   . Pulmonary embolism ([facility])     DD Interactions:  Cefepime 12/5 >   Assessment: Patient on chronic warfarin therapy for atrial fibrillation and history of DVT/PE. Patient follows with outpatient anticoagulation clinic. Home dose is 3 mg PO daily (weekly total dose of 21 mg). INR 2.0 (therapeutic) on admission. Cefepime IV started 12/5 for UTI  Date INR Dose 12/5 2.0 3 mg (home dose)  12/6 2.5 3 mg  12/7 2.8 1 mg due to increasing INR  Goal of Therapy:  INR 2-3    Plan:  --Will give reduced dose of 1 mg today, due to INR increasing towards upper limit of goal --INR daily and CBC every 3 days while on therapy  Dorothe Pea, PharmD, BCPS 12/25/2019,12:09 PM

## 2019-12-25 NOTE — Progress Notes (Signed)
PROGRESS NOTE    Hailey Lopez  QIH:[id number] DOB: 09/03/1929 DOA: 12/23/2019 PCP: Marda Stalker, PA-C   Chief Complaint.  Altered mental status Brief Narrative:  Hailey a24 y.o.African-American femalewith a known Lopez of hypertension, dyslipidemia, atrial fibrillation, DVT and PE on Coumadin and stage IV chronic kidney disease, who presented to the emergency room with acute onset of feverof101.9 last night. She has been having associated coughproductive of yellowish sputumwith altered mental statuswith significant lethargyand generalized weaknesstoday. She went to her primary care physician today whowasapparently concerned about possible pneumonia. She has had a Lopez of UTIs in the past. She has been having urinary frequency per her niece and her urine has been smelling.   Patient had  a peak lactic acid 4.6, temperature 99.2, HR 96, abnormal urine.  No leukocytosis.  Chest x-ray has no acute changes.  Patient diagnosed with acute urinary tract infection, started on cefepime.   Assessment & Plan:   Active Problems:   Paroxysmal atrial fibrillation (HCC)   Essential hypertension   UTI (urinary tract infection)   Acute metabolic encephalopathy   Lactic acidosis   Acute renal failure superimposed on stage 4 chronic kidney disease ([place])  #1.  Metabolic encephalopathy. Urinary tract infection. Lactic acidosis Dementia. Patient did not meet sepsis criteria.  Her lactic acidosis most likely due to urinary tract infection.  Chest x-ray has no pneumonia. Cultures were not sent in the emergency room before antibiotics. Continue cefepime another day. May be able to discharge tomorrow.  #2 pure acute kidney injury on chronic kidney disease stage IV. Renal function still not at baseline yet.  Continue gentle rehydration.  Recheck a level tomorrow.  3.  Paroxysmal atrial fibrillation, Lopez of PE and DVT. Continue warfarin.    DVT prophylaxis:  Warfarin. Code Status:  Family Communication: Daughter at bedside Disposition Plan:  .   Status is: Inpatient  Remains inpatient appropriate because:Inpatient level of care appropriate due to severity of illness   Dispo: The patient is from: Home              Anticipated d/c is to: Home              Anticipated d/c date is: 1 day              Patient currently is not medically stable to d/c.        I/O last 3 completed shifts: In: 4225.3 [P.O.:600; I.V.:2093.5; IV Piggyback:1531.8] Out: 350 [Urine:350] Total I/O In: 240 [P.O.:240] Out: -      Consultants:   None  Procedures: None  Antimicrobials:  Cefepime  Subjective: Patient doing well.  Patient is less sleepy, appetite has improved. Denies any abdominal pain or nausea vomiting. Constipated, no bowel movement since admission.  A dose of lactulose given. Denies any short of breath or cough. No fever or chills.  Objective: Vitals:   12/24/19 2058 12/24/19 2324 12/25/19 0452 12/25/19 0815  BP: (!) 145/69 (!) 124/52 (!) 126/57 (!) 143/63  Pulse: 70 60 (!) 59 (!) 59  Resp: 16 16 15 20   Temp: (!) 97.5 F (36.4 C) 98.5 F (36.9 C) 98.3 F (36.8 C) 98.2 F (36.8 C)  TempSrc:  Axillary Oral Axillary  SpO2: 97% 99% 100% 99%  Weight:      Height:        Intake/Output Summary (Last 24 hours) at 12/25/2019 1047 Last data filed at 12/25/2019 1011 Gross per 24 hour  Intake 2352.36 ml  Output --  Net 2352.36 ml  Filed Weights   12/23/19 1250 12/23/19 2343  Weight: 65.8 kg 62.1 kg    Examination:  General exam: Appears calm and comfortable  Respiratory system: Clear to auscultation. Respiratory effort normal. Cardiovascular system: S1 & S2 heard, RRR. No JVD, murmurs, rubs, gallops or clicks. No pedal edema. Gastrointestinal system: Abdomen is nondistended, soft and nontender. No organomegaly or masses felt. Normal bowel sounds heard. Central nervous system: Alert and oriented x1. No focal neurological  deficits. Extremities: Symmetric 5 x 5 power. Skin: No rashes, lesions or ulcers Psychiatry: Mood & affect appropriate.     Data Reviewed: I have personally reviewed following labs and imaging studies  CBC: Recent Labs  Lab 12/23/19 1253 12/25/19 0443  WBC 10.5 4.8  NEUTROABS 7.9* 2.8  HGB 14.3 10.8*  HCT 43.5 31.8*  MCV 93.3 92.2  PLT 244 751   Basic Metabolic Panel: Recent Labs  Lab 12/23/19 1253 12/24/19 0320 12/25/19 0443  NA 143 145 141  K 4.7 4.0 4.0  CL 107 111 113*  CO2 25 22 22   GLUCOSE 131* 93 91  BUN 50* 45* 35*  CREATININE 3.75* 3.02* 2.42*  CALCIUM 10.2 9.1 8.0*  MG  --   --  2.0   GFR: Estimated Creatinine Clearance: 13.5 mL/min (A) (by C-G formula based on SCr of 2.42 mg/dL (H)). Liver Function Tests: Recent Labs  Lab 12/23/19 1253  AST 27  ALT 16  ALKPHOS 44  BILITOT 1.3*  PROT 8.0  ALBUMIN 3.5   No results for input(s): LIPASE, AMYLASE in the last 168 hours. No results for input(s): AMMONIA in the last 168 hours. Coagulation Profile: Recent Labs  Lab 12/23/19 1253 12/24/19 0320 12/25/19 0443  INR 2.0* 2.5* 2.8*   Cardiac Enzymes: No results for input(s): CKTOTAL, CKMB, CKMBINDEX, TROPONINI in the last 168 hours. BNP (last 3 results) No results for input(s): PROBNP in the last 8760 hours. HbA1C: No results for input(s): HGBA1C in the last 72 hours. CBG: No results for input(s): GLUCAP in the last 168 hours. Lipid Profile: No results for input(s): CHOL, HDL, LDLCALC, TRIG, CHOLHDL, LDLDIRECT in the last 72 hours. Thyroid Function Tests: No results for input(s): TSH, T4TOTAL, FREET4, T3FREE, THYROIDAB in the last 72 hours. Anemia Panel: No results for input(s): VITAMINB12, FOLATE, FERRITIN, TIBC, IRON, RETICCTPCT in the last 72 hours. Sepsis Labs: Recent Labs  Lab 12/23/19 1253 12/23/19 1701 12/23/19 1954 12/23/19 2349  LATICACIDVEN 2.2* 3.6* 4.6* 3.2*    Recent Results (from the past 240 hour(s))  Resp Panel by RT-PCR  (Flu A&B, Covid) Nasopharyngeal Swab     Status: None   Collection Time: 12/23/19  5:01 PM   Specimen: Nasopharyngeal Swab; Nasopharyngeal(NP) swabs in vial transport medium  Result Value Ref Range Status   SARS Coronavirus 2 by RT PCR NEGATIVE NEGATIVE Final    Comment: (NOTE) SARS-CoV-2 target nucleic acids are NOT DETECTED.  The SARS-CoV-2 RNA is generally detectable in upper respiratory specimens during the acute phase of infection. The lowest concentration of SARS-CoV-2 viral copies this assay can detect is 138 copies/mL. A negative result does not preclude SARS-Cov-2 infection and should not be used as the sole basis for treatment or other patient management decisions. A negative result may occur with  improper specimen collection/handling, submission of specimen other than nasopharyngeal swab, presence of viral mutation(s) within the areas targeted by this assay, and inadequate number of viral copies(<138 copies/mL). A negative result must be combined with clinical observations, patient Lopez, and epidemiological information. The  expected result is Negative.  Fact Sheet for Patients:  EntrepreneurPulse.com.au  Fact Sheet for Healthcare Providers:  IncredibleEmployment.be  This test is no t yet approved or cleared by the [place] FDA and  has been authorized for detection and/or diagnosis of SARS-CoV-2 by FDA under an Emergency Use Authorization (EUA). This EUA will remain  in effect (meaning this test can be used) for the duration of the COVID-19 declaration under Section 564(b)(1) of the Act, 21 U.S.C.section 360bbb-3(b)(1), unless the authorization is terminated  or revoked sooner.       Influenza A by PCR NEGATIVE NEGATIVE Final   Influenza B by PCR NEGATIVE NEGATIVE Final    Comment: (NOTE) The Xpert Xpress SARS-CoV-2/FLU/RSV plus assay is intended as an aid in the diagnosis of influenza from Nasopharyngeal swab specimens  and should not be used as a sole basis for treatment. Nasal washings and aspirates are unacceptable for Xpert Xpress SARS-CoV-2/FLU/RSV testing.  Fact Sheet for Patients: EntrepreneurPulse.com.au  Fact Sheet for Healthcare Providers: IncredibleEmployment.be  This test is not yet approved or cleared by the [place] FDA and has been authorized for detection and/or diagnosis of SARS-CoV-2 by FDA under an Emergency Use Authorization (EUA). This EUA will remain in effect (meaning this test can be used) for the duration of the COVID-19 declaration under Section 564(b)(1) of the Act, 21 U.S.C. section 360bbb-3(b)(1), unless the authorization is terminated or revoked.  Performed at [facility], [address] [place], [place] [address]          Radiology Studies: DG Chest 2 View  Result Date: 12/23/2019 CLINICAL DATA:  Cough.  Shortness of breath.  Low-grade fever. EXAM: CHEST - 2 VIEW COMPARISON:  None. FINDINGS: Cardiac silhouette borderline enlarged. No mediastinal or hilar masses or evidence of adenopathy. Lungs are hyperexpanded, but clear. Left anterior chest wall sequential pacemaker is well positioned. Skeletal structures are demineralized but intact. IMPRESSION: No active cardiopulmonary disease. Electronically Signed   By: Lajean Manes M.D.   On: 12/23/2019 13:27        Scheduled Meds: . atorvastatin  10 mg Oral Daily  . calcitRIOL  0.5 mcg Oral QHS  . donepezil  5 mg Oral QHS  . isosorbide dinitrate  5 mg Oral Daily  . lactulose  20 g Oral Once  . warfarin  3 mg Oral q1600  . Warfarin - Physician Dosing Inpatient   Does not apply q1600   Continuous Infusions: . sodium chloride 50 mL/hr at 12/25/19 0337  . ceFEPime (MAXIPIME) IV Stopped (12/24/19 2115)     LOS: 2 days    Time spent: 28 minutes    Sharen Hones, MD [facility]   To contact the attending provider between 7A-7P or the covering  provider during after hours 7P-7A, please log into the web site [email or web address] and access using universal [place] password for that web site. If you do not have the password, please call the hospital operator.  12/25/2019, 10:47 AM

## 2019-12-26 LAB — CBC WITH DIFFERENTIAL/PLATELET
Abs Immature Granulocytes: 0 10*3/uL (ref 0.00–0.07)
Basophils Absolute: 0 10*3/uL (ref 0.0–0.1)
Basophils Relative: 1 %
Eosinophils Absolute: 0.2 10*3/uL (ref 0.0–0.5)
Eosinophils Relative: 4 %
HCT: 31.9 % — ABNORMAL LOW (ref 36.0–46.0)
Hemoglobin: 10.7 g/dL — ABNORMAL LOW (ref 12.0–15.0)
Immature Granulocytes: 0 %
Lymphocytes Relative: 30 %
Lymphs Abs: 1.2 10*3/uL (ref 0.7–4.0)
MCH: 30.6 pg (ref 26.0–34.0)
MCHC: 33.5 g/dL (ref 30.0–36.0)
MCV: 91.1 fL (ref 80.0–100.0)
Monocytes Absolute: 0.4 10*3/uL (ref 0.1–1.0)
Monocytes Relative: 11 %
Neutro Abs: 2.2 10*3/uL (ref 1.7–7.7)
Neutrophils Relative %: 54 %
Platelets: 203 10*3/uL (ref 150–400)
RBC: 3.5 MIL/uL — ABNORMAL LOW (ref 3.87–5.11)
RDW: 13.2 % (ref 11.5–15.5)
WBC: 4 10*3/uL (ref 4.0–10.5)
nRBC: 0 % (ref 0.0–0.2)

## 2019-12-26 LAB — BASIC METABOLIC PANEL
Anion gap: 7 (ref 5–15)
BUN: 24 mg/dL — ABNORMAL HIGH (ref 8–23)
CO2: 21 mmol/L — ABNORMAL LOW (ref 22–32)
Calcium: 8.2 mg/dL — ABNORMAL LOW (ref 8.9–10.3)
Chloride: 113 mmol/L — ABNORMAL HIGH (ref 98–111)
Creatinine, Ser: 1.95 mg/dL — ABNORMAL HIGH (ref 0.44–1.00)
GFR, Estimated: 24 mL/min — ABNORMAL LOW (ref >60)
Glucose, Bld: 88 mg/dL (ref 70–99)
Potassium: 3.9 mmol/L (ref 3.5–5.1)
Sodium: 141 mmol/L (ref 135–145)

## 2019-12-26 LAB — PROTIME-INR
INR: 3 — ABNORMAL HIGH (ref 0.8–1.2)
Prothrombin Time: 29.8 seconds — ABNORMAL HIGH (ref 11.4–15.2)

## 2019-12-26 LAB — MAGNESIUM: Magnesium: 2 mg/dL (ref 1.7–2.4)

## 2019-12-26 MED ORDER — WARFARIN SODIUM 1 MG PO TABS
1.0000 mg | ORAL_TABLET | Freq: Once | ORAL | Status: AC
  Administered 2019-12-26: 1 mg via ORAL
  Filled 2019-12-26: qty 1

## 2019-12-26 NOTE — Care Management (Signed)
TOC assessment completed full note to follow Home health referral has been made to [facility]

## 2019-12-26 NOTE — Progress Notes (Signed)
ANTICOAGULATION CONSULT NOTE - Initial Consult  Pharmacy Consult for wafarin management Indication: atrial fibrillation  No Known Allergies  Patient Measurements: Height: 5\' 3"  (160 cm) Weight: 62.1 kg (136 lb 14.4 oz) IBW/kg (Calculated) : 52.4   Vital Signs: Temp: 98.1 F (36.7 C) (12/08 0556) Temp Source: Oral (12/08 0556) BP: 151/68 (12/08 0556) Pulse Rate: 64 (12/08 0556)  Labs: Recent Labs    12/23/19 1253 12/23/19 1253 12/24/19 0320 12/25/19 0443 12/26/19 0452  HGB 14.3   < >  --  10.8* 10.7*  HCT 43.5  --   --  31.8* 31.9*  PLT 244  --   --  188 203  LABPROT 22.2*   < > 25.8* 28.6* 29.8*  INR 2.0*   < > 2.5* 2.8* 3.0*  CREATININE 3.75*   < > 3.02* 2.42* 1.95*   < > = values in this interval not displayed.    Estimated Creatinine Clearance: 16.8 mL/min (A) (by C-G formula based on SCr of 1.95 mg/dL (H)).   Medical History: Past Medical History:  Diagnosis Date  . Alzheimer's disease ([place])   . Atrial fibrillation ([place])   . Chronic kidney disease    Stage 4  . CVA (cerebrovascular accident) ([place])   . DVT (deep venous thrombosis) ([place]) 2013   LLE  . Hyperlipidemia   . Hypertension   . Pacemaker   . Pulmonary embolism (HCC)     DD Interactions:  Cefepime 12/5 >   Assessment: Patient on chronic warfarin therapy for atrial fibrillation and history of DVT/PE. Patient follows with outpatient anticoagulation clinic. Home dose is 3 mg PO daily (weekly total dose of 21 mg). INR 2.0 (therapeutic) on admission. Cefepime IV started 12/5 for UTI  Date INR Dose 12/5 2.0 3 mg (home dose)  12/6 2.5 3 mg  12/7 2.8 1 mg due to increasing INR 12/8 3.0 1 mg   Goal of Therapy:  INR 2-3    Plan:  --Will give reduced dose of 1 mg today, due to INR increasing towards upper limit of goal --Anticipate able to resume usual home dose once antibiotics are discontinued --INR daily and CBC every 3 days while on therapy  Dorothe Pea, PharmD, BCPS 12/26/2019,7:32  AM

## 2019-12-26 NOTE — Progress Notes (Signed)
Physical Therapy Treatment Patient Details Name: Hailey Lopez MRN: [id number] DOB: Oct 28, 1929 Today's Date: 12/26/2019    History of Present Illness  Patient is a 86 year old female with UTI with subsequent metabolic encephalopathy and generalized weakness, acute kidney injury superimposed on stage IV chronic kidney disease. History of dementia, PE, DVT, atrial fibrillation.     PT Comments    Pt continues to have significant limitations related to her dementia/mental status.  She was, ostensibly, willing to work with PT t/o the session but could not follow through on all but the most temporal cues before needing further verbal and (more effectively much of the time) tactile cuing.  Even with this she would freeze, stare off and ultimately show poor safety with no situational awareness.  Per prior documentation she apparently has been needing to borrow a w/c to allow for longer distance and in home (secondary to safety) distances.  Per today's performance it is obvious that for the safety of pt and caregivers that a w/c will allow her to be much more mobile and active, especially when time is of the essence.    Patient suffers from dementia which impairs his/her ability to perform daily activities like toileting, feeding, dressing, grooming, bathing in the home. A cane, walker, crutch will not resolve the patient's issue with performing activities of daily living. A lightweight wheelchair and cushion is required/recommended and will allow patient to safely perform daily activities.   Patient has a caregiver who can provide assistance.      Follow Up Recommendations  Home health PT;Supervision for mobility/OOB     Equipment Recommendations  Wheelchair (measurements PT)    Recommendations for Other Services       Precautions / Restrictions Precautions Precautions: Fall Restrictions Weight Bearing Restrictions: No    Mobility  Bed Mobility Overal bed mobility: Needs  Assistance Bed Mobility: Supine to Sit     Supine to sit: Mod assist;Max assist     General bed mobility comments: Pt able to show some voluntary effort, but needed considerable assist to initiate and ultimately get to sitting   Transfers Overall transfer level: Needs assistance Equipment used: 1 person hand held assist Transfers: Sit to/from Stand Sit to Stand: Min guard         General transfer comment: again needing repeated maximal cues (verbal and some tactile) to initiate transition to standing, she did not need heavy physical assist but likely would not have gotten up at all (mental status) w/o some direct assist/cuing  Ambulation/Gait Ambulation/Gait assistance: Min guard Gait Distance (Feet): 25 Feet Assistive device: 1 person hand held assist       General Gait Details: Pt extremely guarded, confused and needing constant cuing and assist to insure appropirate direction, effort and to maintain some movement (much less cadence).  Pt self selects stopping essentially every step w/o constant cuing   Stairs             Wheelchair Mobility    Modified Rankin (Stroke Patients Only)       Balance Overall balance assessment: Needs assistance Sitting-balance support: Feet supported Sitting balance-Leahy Scale: Fair Sitting balance - Comments: initially unable to scoot/shift herself appropriately in getting to EOB to maintain sitting, once assisted she did manage to maintain upright   Standing balance support: Bilateral upper extremity supported Standing balance-Leahy Scale: Fair Standing balance comment: with hand held assistance, no loss of balance - however he mental status and inability to follow all but the most basic and  temporal cues makes safe standing questionable w/o direct hands on assist                            Cognition Arousal/Alertness: Awake/alert Behavior During Therapy: Flat affect Overall Cognitive Status: History of cognitive  impairments - at baseline                                 General Comments: per notes: patient performs best with caregiver giving direction for mobility efforts      Exercises      General Comments        Pertinent Vitals/Pain Pain Assessment: No/denies pain    Home Living                      Prior Function            PT Goals (current goals can now be found in the care plan section) Progress towards PT goals: Progressing toward goals    Frequency    Min 2X/week      PT Plan Current plan remains appropriate;Equipment recommendations need to be updated    Co-evaluation              AM-PAC PT "6 Clicks" Mobility   Outcome Measure  Help needed turning from your back to your side while in a flat bed without using bedrails?: None Help needed moving from lying on your back to sitting on the side of a flat bed without using bedrails?: A Lot Help needed moving to and from a bed to a chair (including a wheelchair)?: A Lot Help needed standing up from a chair using your arms (e.g., wheelchair or bedside chair)?: A Little Help needed to walk in hospital room?: A Lot Help needed climbing 3-5 steps with a railing? : A Lot 6 Click Score: 15    End of Session Equipment Utilized During Treatment: Gait belt Activity Tolerance:  (limited due to cognitive level) Patient left: with bed alarm set;with call bell/phone within reach   PT Visit Diagnosis: Muscle weakness (generalized) (M62.81)     Time: 2633-3545 PT Time Calculation (min) (ACUTE ONLY): 17 min  Charges:  $Gait Training: 8-22 mins                     Kreg Shropshire, DPT 12/26/2019, 4:46 PM

## 2019-12-26 NOTE — Care Management Important Message (Signed)
Important Message  Patient Details  Name: Hailey Lopez MRN: [id number] Date of Birth: August 02, 1929   Medicare Important Message Given:  Yes     Dannette Barbara 12/26/2019, 11:24 AM

## 2019-12-26 NOTE — Progress Notes (Signed)
PROGRESS NOTE  Hailey Lopez PPI:[id number] DOB: Apr 06, 1929 DOA: 12/23/2019 PCP: Marda Stalker, PA-C  HPI/Recap of past 24 hours: JohnsieLucheyis a86 y.o.African-American femaleewith a known history of hypertension, dyslipidemia, atrial fibrillation, DVT and PE on Coumadin and stage IV chronic kidney disease, who presented to the emergency room with acute onset of feverof101.9 last night. She has been having associated coughproductive of yellowish sputumwith altered mental statuswith significant lethargyand generalized weaknesstoday. She went to her primary care physician today whowasapparently concerned about possible pneumonia. She has had a history of UTIs in the past. She has been having urinary frequency per her niece and her urine has been smelling.  Patienthada peak lactic acid 4.6, temperature 99.2,HR 96,abnormal urine. No leukocytosis. Chest x-ray has no acute changes. Patient diagnosed with acute urinary tract infection, started on cefepime.  12/26/19: Seen and examined at his bedside.  He is alert but confused.  He has no new complaints.  He denies any pain.  Tries to get out of bed however he is redirectable.  Assessment/Plan: Active Problems:   Paroxysmal atrial fibrillation (HCC)   Essential hypertension   UTI (urinary tract infection)   Acute metabolic encephalopathy   Lactic acidosis   Acute renal failure superimposed on stage 4 chronic kidney disease (HCC)  Acute metabolic encephalopathy likely secondary to presumed UTI Urine analysis taken on 12/23/2019 + for pyuria No urine culture available, no blood cultures available Currently treated empirically with IV cefepime Reorient as needed Fall precautions and delirium precautions.  Presumed UTI No urine culture or blood cultures obtained Treated empirically with cefepime  AKI on CKD 4 Appears to be back to his baseline creatinine 1.9 with GFR of 24 Creatinine up to 3.02 with GFR of 14  on 12/24/2019 Continue to avoid nephrotoxins and monitor urine output Daily renal panel  Paroxysmal A. fib on Coumadin Currently rate controlled not on any rate control agents On Coumadin for CVA prevention  History of DVT and PE Continue warfarin INR therapeutic at 3.0 on 12/26/2019  Code Status: Full code  Family Communication: None at present  Disposition Plan: Likely will DC to home   Consultants:  None.  Procedures:  None.  Antimicrobials:  Cefepime  DVT prophylaxis: Coumadin  Status is: Inpatient    Dispo: The patient is from: Home.               Anticipated d/c is to: Home.              Anticipated d/c date is: 12/27/2019              Patient currently not stable for discharge due to ongoing management of presumed UTI.       Objective: Vitals:   12/26/19 0556 12/26/19 0801 12/26/19 1130 12/26/19 1544  BP: (!) 151/68 (!) 141/75 (!) 153/74 138/71  Pulse: 64 60 60 (!) 59  Resp: 18 16 20 16   Temp: 98.1 F (36.7 C) 98.2 F (36.8 C) 97.6 F (36.4 C) 98.3 F (36.8 C)  TempSrc: Oral Oral Axillary   SpO2: 100% 100% 100% 100%  Weight:      Height:        Intake/Output Summary (Last 24 hours) at 12/26/2019 1641 Last data filed at 12/26/2019 1030 Gross per 24 hour  Intake 480 ml  Output 950 ml  Net -470 ml   Filed Weights   12/23/19 1250 12/23/19 2343  Weight: 65.8 kg 62.1 kg    Exam:  . General: 86 y.o. year-old female well developed  well nourished in no acute distress.  Alert and confused. . Cardiovascular: Regular rate and rhythm with no rubs or gallops.  No thyromegaly or JVD noted.   Marland Kitchen Respiratory: Clear to auscultation with no wheezes or rales. Good inspiratory effort. . Abdomen: Soft nontender nondistended with normal bowel sounds x4 quadrants. . Musculoskeletal: No lower extremity edema bilaterally.   Marland Kitchen Psychiatry: Mood is appropriate for condition and setting   Data Reviewed: CBC: Recent Labs  Lab 12/23/19 1253 12/25/19 0443  12/26/19 0452  WBC 10.5 4.8 4.0  NEUTROABS 7.9* 2.8 2.2  HGB 14.3 10.8* 10.7*  HCT 43.5 31.8* 31.9*  MCV 93.3 92.2 91.1  PLT 244 188 914   Basic Metabolic Panel: Recent Labs  Lab 12/23/19 1253 12/24/19 0320 12/25/19 0443 12/26/19 0452  NA 143 145 141 141  K 4.7 4.0 4.0 3.9  CL 107 111 113* 113*  CO2 25 22 22  21*  GLUCOSE 131* 93 91 88  BUN 50* 45* 35* 24*  CREATININE 3.75* 3.02* 2.42* 1.95*  CALCIUM 10.2 9.1 8.0* 8.2*  MG  --   --  2.0 2.0   GFR: Estimated Creatinine Clearance: 16.8 mL/min (A) (by C-G formula based on SCr of 1.95 mg/dL (H)). Liver Function Tests: Recent Labs  Lab 12/23/19 1253  AST 27  ALT 16  ALKPHOS 44  BILITOT 1.3*  PROT 8.0  ALBUMIN 3.5   No results for input(s): LIPASE, AMYLASE in the last 168 hours. No results for input(s): AMMONIA in the last 168 hours. Coagulation Profile: Recent Labs  Lab 12/23/19 1253 12/24/19 0320 12/25/19 0443 12/26/19 0452  INR 2.0* 2.5* 2.8* 3.0*   Cardiac Enzymes: No results for input(s): CKTOTAL, CKMB, CKMBINDEX, TROPONINI in the last 168 hours. BNP (last 3 results) No results for input(s): PROBNP in the last 8760 hours. HbA1C: No results for input(s): HGBA1C in the last 72 hours. CBG: No results for input(s): GLUCAP in the last 168 hours. Lipid Profile: No results for input(s): CHOL, HDL, LDLCALC, TRIG, CHOLHDL, LDLDIRECT in the last 72 hours. Thyroid Function Tests: No results for input(s): TSH, T4TOTAL, FREET4, T3FREE, THYROIDAB in the last 72 hours. Anemia Panel: No results for input(s): VITAMINB12, FOLATE, FERRITIN, TIBC, IRON, RETICCTPCT in the last 72 hours. Urine analysis:    Component Value Date/Time   COLORURINE YELLOW (A) 12/23/2019 1701   APPEARANCEUR CLOUDY (A) 12/23/2019 1701   LABSPEC 1.021 12/23/2019 1701   PHURINE 6.0 12/23/2019 1701   GLUCOSEU NEGATIVE 12/23/2019 1701   HGBUR SMALL (A) 12/23/2019 1701   BILIRUBINUR NEGATIVE 12/23/2019 1701   KETONESUR NEGATIVE 12/23/2019 1701    PROTEINUR 100 (A) 12/23/2019 1701   NITRITE NEGATIVE 12/23/2019 1701   LEUKOCYTESUR LARGE (A) 12/23/2019 1701   Sepsis Labs: @LABRCNTIP (procalcitonin:4,lacticidven:4)  ) Recent Results (from the past 240 hour(s))  Resp Panel by RT-PCR (Flu A&B, Covid) Nasopharyngeal Swab     Status: None   Collection Time: 12/23/19  5:01 PM   Specimen: Nasopharyngeal Swab; Nasopharyngeal(NP) swabs in vial transport medium  Result Value Ref Range Status   SARS Coronavirus 2 by RT PCR NEGATIVE NEGATIVE Final    Comment: (NOTE) SARS-CoV-2 target nucleic acids are NOT DETECTED.  The SARS-CoV-2 RNA is generally detectable in upper respiratory specimens during the acute phase of infection. The lowest concentration of SARS-CoV-2 viral copies this assay can detect is 138 copies/mL. A negative result does not preclude SARS-Cov-2 infection and should not be used as the sole basis for treatment or other patient management decisions. A negative  result may occur with  improper specimen collection/handling, submission of specimen other than nasopharyngeal swab, presence of viral mutation(s) within the areas targeted by this assay, and inadequate number of viral copies(<138 copies/mL). A negative result must be combined with clinical observations, patient history, and epidemiological information. The expected result is Negative.  Fact Sheet for Patients:  EntrepreneurPulse.com.au  Fact Sheet for Healthcare Providers:  IncredibleEmployment.be  This test is no t yet approved or cleared by the [place] FDA and  has been authorized for detection and/or diagnosis of SARS-CoV-2 by FDA under an Emergency Use Authorization (EUA). This EUA will remain  in effect (meaning this test can be used) for the duration of the COVID-19 declaration under Section 564(b)(1) of the Act, 21 U.S.C.section 360bbb-3(b)(1), unless the authorization is terminated  or revoked sooner.        Influenza A by PCR NEGATIVE NEGATIVE Final   Influenza B by PCR NEGATIVE NEGATIVE Final    Comment: (NOTE) The Xpert Xpress SARS-CoV-2/FLU/RSV plus assay is intended as an aid in the diagnosis of influenza from Nasopharyngeal swab specimens and should not be used as a sole basis for treatment. Nasal washings and aspirates are unacceptable for Xpert Xpress SARS-CoV-2/FLU/RSV testing.  Fact Sheet for Patients: EntrepreneurPulse.com.au  Fact Sheet for Healthcare Providers: IncredibleEmployment.be  This test is not yet approved or cleared by the [place] FDA and has been authorized for detection and/or diagnosis of SARS-CoV-2 by FDA under an Emergency Use Authorization (EUA). This EUA will remain in effect (meaning this test can be used) for the duration of the COVID-19 declaration under Section 564(b)(1) of the Act, 21 U.S.C. section 360bbb-3(b)(1), unless the authorization is terminated or revoked.  Performed at [facility], [address], [place], [place] [address]       Studies: No results found.  Scheduled Meds: . atorvastatin  10 mg Oral Daily  . calcitRIOL  0.5 mcg Oral QHS  . donepezil  5 mg Oral QHS  . isosorbide dinitrate  5 mg Oral Daily  . warfarin  1 mg Oral ONCE-1600  . Warfarin - Pharmacist Dosing Inpatient   Does not apply q1600    Continuous Infusions: . sodium chloride 50 mL/hr at 12/25/19 1719  . ceFEPime (MAXIPIME) IV 1 g (12/25/19 2109)     LOS: 3 days     Kayleen Memos, MD [facility] Pager [phone]  If 7PM-7AM, please contact night-coverage [email or web address] Password [facility] 12/26/2019, 4:41 PM

## 2019-12-27 LAB — PROTIME-INR
INR: 2.5 — ABNORMAL HIGH (ref 0.8–1.2)
Prothrombin Time: 25.9 seconds — ABNORMAL HIGH (ref 11.4–15.2)

## 2019-12-27 MED ORDER — SACCHAROMYCES BOULARDII 250 MG PO CAPS
250.0000 mg | ORAL_CAPSULE | Freq: Two times a day (BID) | ORAL | Status: DC
  Filled 2019-12-27 (×2): qty 1

## 2019-12-27 MED ORDER — CEPHALEXIN 500 MG PO CAPS: 500.0000 mg | ORAL_CAPSULE | Freq: Three times a day (TID) | ORAL | 0 refills | Status: AC

## 2019-12-27 MED ORDER — WARFARIN SODIUM 3 MG PO TABS
3.0000 mg | ORAL_TABLET | Freq: Once | ORAL | Status: DC
  Filled 2019-12-27: qty 1

## 2019-12-27 MED ORDER — SACCHAROMYCES BOULARDII 250 MG PO CAPS: 250.0000 mg | ORAL_CAPSULE | Freq: Two times a day (BID) | ORAL | 0 refills | Status: AC

## 2019-12-27 MED ORDER — CEPHALEXIN 500 MG PO CAPS
500.0000 mg | ORAL_CAPSULE | Freq: Three times a day (TID) | ORAL | Status: DC
  Administered 2019-12-27: 500 mg via ORAL
  Filled 2019-12-27: qty 1

## 2019-12-27 MED ORDER — CEPHALEXIN 500 MG PO CAPS: 500.0000 mg | ORAL_CAPSULE | Freq: Three times a day (TID) | ORAL | 0 refills | Status: DC

## 2019-12-27 NOTE — Care Management (Signed)
    Durable Medical Equipment  (From admission, onward)         Start     Ordered   12/27/19 1028  For home use only DME lightweight manual wheelchair with seat cushion  Once       Comments: Patient suffers from chronic deconditioning which impairs their ability to perform daily activities like ambulating in the home.  A walker will not resolve  issue with performing activities of daily living. A wheelchair will allow patient to safely perform daily activities. Patient is not able to propel themselves in the home using a standard weight wheelchair due to weakness. Patient can self propel in the lightweight wheelchair. Length of need life time  Accessories: elevating leg rests (ELRs), wheel locks, extensions and anti-tippers.   12/27/19 1029

## 2019-12-27 NOTE — TOC Initial Note (Addendum)
Transition of Care [facility]) - Initial/Assessment Note    Patient Details  Name: Hailey Lopez MRN: [id number] Date of Birth: 02-09-1929  Transition of [facility]) CM/SW Contact:    Beverly Sessions, RN Phone Number: 12/27/2019, 10:50 AM  Clinical Narrative:                 Patient admitted from home with UTI Patient with history of dementia  Lives at home with Niece, and nieces spouse  Niece at bedside  Niece states that she is currently not employed and is the patient's primary caregiver.   PCP [place].  Denies issues obtaining medications  Niece provides transportation  Patient has a loaner WC  PT has assessed patient and recommends home health.  Niece is in agreement and would like to use [place].  Referral made to [facility] with [place].  Patient will discharge to [place] [address]  Apartment [phone].  Corene Cornea with [place] notified.   WC ordered.  Referral made to [facility] with [facility].  Mardene Celeste to reach out to niece about copay.  If niece is in agreement to copay then [facility] will be delivered to room  Update: Niece is going to hold of having WC delivered at this time and continue using their loaner [facility]  Expected Discharge Plan: [place] Barriers to Discharge: No Barriers Identified   Patient Goals and CMS Choice        Expected Discharge Plan and Services Expected Discharge Plan: [place]   Discharge Planning Services: CM Consult   Living arrangements for the past 2 months: Apartment                 DME Arranged: Wheelchair manual DME Agency: [employer] Date DME Agency Contacted: 12/27/19   Representative spoke with at DME Agency: Mardene Celeste HH Arranged: RN,PT,OT,Nurse's Aide [place] Agency: [place] ([place]) Date [place]: 12/27/19   Representative spoke with at [place]: Corene Cornea  Prior Living  Arrangements/Services Living arrangements for the past 2 months: Apartment Lives with:: Relatives Patient language and need for interpreter reviewed:: Yes Do you feel safe going back to the place where you live?: Yes      Need for Family Participation in Patient Care: Yes (Comment) Care giver support system in place?: Yes (comment) Current home services: DME Criminal Activity/Legal Involvement Pertinent to Current Situation/Hospitalization: No - Comment as needed  Activities of Daily Living   ADL Screening (condition at time of admission) Patient's cognitive ability adequate to safely complete daily activities?: No Does the patient have difficulty concentrating, remembering, or making decisions?: Yes Patient able to express need for assistance with ADLs?: No Does the patient have difficulty dressing or bathing?: Yes Independently performs ADLs?: No Communication: Needs assistance Is this a change from baseline?: Pre-admission baseline Dressing (OT): Needs assistance Is this a change from baseline?: Pre-admission baseline Grooming: Needs assistance Is this a change from baseline?: Pre-admission baseline Feeding: Needs assistance Is this a change from baseline?: Pre-admission baseline Bathing: Needs assistance Is this a change from baseline?: Pre-admission baseline Toileting: Needs assistance Is this a change from baseline?: Pre-admission baseline In/Out Bed: Needs assistance Is this a change from baseline?: Pre-admission baseline Walks in Home: Needs assistance Is this a change from baseline?: Pre-admission baseline Does the patient have difficulty walking or climbing stairs?: Yes Weakness of Legs: None Weakness of Arms/Hands: None  Permission Sought/Granted  Emotional Assessment           Psych Involvement: No (comment)  Admission diagnosis:  UTI (urinary tract infection) [N39.0] Weakness [R53.1] Lower urinary tract infection [N39.0] Patient  Active Problem List   Diagnosis Date Noted  . Acute metabolic encephalopathy 27/63/9432  . Lactic acidosis 12/24/2019  . Acute renal failure superimposed on stage 4 chronic kidney disease ([place]) 12/24/2019  . UTI (urinary tract infection) 12/23/2019  . Long term (current) use of anticoagulants 07/31/2018  . SSS (sick sinus syndrome) ([place]) 11/25/2017  . Paroxysmal atrial fibrillation ([place]) 11/25/2017  . Pacemaker 11/25/2017  . History of pulmonary embolism 11/25/2017  . CKD (chronic kidney disease) stage 4, GFR 15-29 ml/min (HCC) 11/25/2017  . Essential hypertension 11/25/2017  . Hypercholesterolemia 11/25/2017   PCP:  Marda Stalker, PA-C Pharmacy:   [facility], [place] [address] Phone: [phone] Fax: [phone]     Social Determinants of Health (SDOH) Interventions    Readmission Risk Interventions No flowsheet data found.

## 2019-12-27 NOTE — Discharge Summary (Addendum)
Discharge Summary  Hailey Lopez CHE:[id number] DOB: 08-Mar-1929  PCP: Marda Stalker, PA-C  Admit date: 12/23/2019 Discharge date: 12/27/2019  Time spent: 35 minutes  Recommendations for Outpatient Follow-up:  1. Follow-up with your primary care provider within a week 2. follow-up with your cardiologist 3. take your medications as prescribed 4. continue PT OT with assistance and fall precautions  Discharge Diagnoses:  Active Hospital Problems   Diagnosis Date Noted  . Acute metabolic encephalopathy 53/61/4431  . Lactic acidosis 12/24/2019  . Acute renal failure superimposed on stage 4 chronic kidney disease ([place]) 12/24/2019  . UTI (urinary tract infection) 12/23/2019  . Paroxysmal atrial fibrillation ([place]) 11/25/2017  . Essential hypertension 11/25/2017    Resolved Hospital Problems  No resolved problems to display.    Discharge Condition: Stable  Diet recommendation: Resume previous diet.  Vitals:   12/27/19 0543 12/27/19 0757  BP: (!) 150/83 121/71  Pulse: (!) 59 (!) 59  Resp: 18 20  Temp: 98.1 F (36.7 C) 98.2 F (36.8 C)  SpO2: 100% 100%    History of present illness:  JohnsieLucheyis a86 y.o.African-American femalewith a known history of hypertension, dyslipidemia, atrial fibrillation, DVT and PE on Coumadin and stage IV chronic kidney disease, who presented to the emergency room with acute onset of feverof101.9 the night prior to her presentation. She has been having associated coughproductive of yellowish sputumwith altered mental statuswith significant lethargyand generalized weakness. She went to her primary care physician whowasconcerned about possible pneumonia. She has had a history of UTIs in the past. She has been having increase in urinary frequency per her niece and her urine has had a foul odor.  Presented with a peak lactic acid 4.6, down trended, temperature 99.2,HR 96, and urine analysis positive for pyuria. Chest x-ray  revealed no acute changes. Patient diagnosed with acute urinary tract infection, started on cefepime.  Received 4 days of cefepime.  Switched to Keflex on 12/27/19 500 milligram 3 times daily x5 days.  12/27/19:  Seen and examined at her bedside.  She is alert and eating her breakfast with nursing staff assistance.  Not in any distress.  She has no new complaints.  Hospital Course:  Active Problems:   Paroxysmal atrial fibrillation (HCC)   Essential hypertension   UTI (urinary tract infection)   Acute metabolic encephalopathy   Lactic acidosis   Acute renal failure superimposed on stage 4 chronic kidney disease (HCC)  resolved acute metabolic encephalopathy likely secondary to presumed UTI Urine analysis taken on 12/23/2019 + for pyuria No urine culture available Afebrile with no leukocytosis Nonseptic appearing, no acute distress. Very calm and appears to be back to her baseline. Continue fall and aspiration precautions  Presumed UTI No urine culture data. Treated empirically with cefepime, received 4 days. Switched to Keflex on 12/27/19 500 milligram 3 times daily x5 days. Added Florastor 250 mg BID x 10 days Follow-up with your PCP within a week  Resolving AKI on CKD 4 Appears to be back to her baseline creatinine 1.9 with GFR of 24 Creatinine up to 3.02 with GFR of 14 on 12/24/2019 Continue to avoid nephrotoxins and monitor urine output Follow-up with your PCP  Paroxysmal A. fib on Coumadin Currently rate controlled not on any rate control agents On Coumadin for CVA prevention Follow-up with cardiology  History of DVT and PE Continue warfarin INR therapeutic at 2.51 on 12/27/19  Code Status: Full code   Consultants:  None.  Procedures:  None.  Antimicrobials:  Cefepime from 12/23/2019 through 12/26/19  Keflex started on 12/27/19 x 5 days.  DVT prophylaxis: Coumadin    Discharge Exam: BP 121/71 (BP Location: Right Arm)   Pulse (!) 59   Temp  98.2 F (36.8 C) (Oral)   Resp 20   Ht 5\' 3"  (1.6 m)   Wt 62.1 kg   SpO2 100%   BMI 24.25 kg/m  . General: 86 y.o. year-old female well developed well nourished in no acute distress.  Alert and follows commands, calm. . Cardiovascular: Regular rate and rhythm with no rubs or gallops.  No thyromegaly or JVD noted.   Marland Kitchen Respiratory: Clear to auscultation with no wheezes or rales. Good inspiratory effort. . Abdomen: Soft nontender nondistended with normal bowel sounds x4 quadrants. . Musculoskeletal: No lower extremity edema bilaterally. Marland Kitchen Psychiatry: Mood is appropriate for condition and setting  Discharge Instructions You were cared for by a hospitalist during your hospital stay. If you have any questions about your discharge medications or the care you received while you were in the hospital after you are discharged, you can call the unit and asked to speak with the hospitalist on call if the hospitalist that took care of you is not available. Once you are discharged, your primary care physician will handle any further medical issues. Please note that NO REFILLS for any discharge medications will be authorized once you are discharged, as it is imperative that you return to your primary care physician (or establish a relationship with a primary care physician if you do not have one) for your aftercare needs so that they can reassess your need for medications and monitor your lab values.   Allergies as of 12/27/2019   No Known Allergies     Medication List    TAKE these medications   atorvastatin 10 MG tablet Commonly known as: LIPITOR Take 10 mg by mouth daily.   calcitRIOL 0.5 MCG capsule Commonly known as: ROCALTROL Take 0.5 mcg by mouth at bedtime.   cephALEXin 500 MG capsule Commonly known as: KEFLEX Take 1 capsule (500 mg total) by mouth 3 (three) times daily for 5 days.   donepezil 5 MG tablet Commonly known as: ARICEPT Take 5 mg by mouth at bedtime.   isosorbide  dinitrate 5 MG tablet Commonly known as: ISORDIL Take 5 mg by mouth daily.   polyethylene glycol 17 g packet Commonly known as: MIRALAX / GLYCOLAX Take 17 g by mouth daily as needed for mild constipation or moderate constipation.   saccharomyces boulardii 250 MG capsule Commonly known as: FLORASTOR Take 1 capsule (250 mg total) by mouth 2 (two) times daily for 10 days.   warfarin 3 MG tablet Commonly known as: COUMADIN Take as directed. If you are unsure how to take this medication, talk to your nurse or doctor. Original instructions: Take 1 tablet (3 mg total) by mouth daily. What changed: when to take this            Durable Medical Equipment  (From admission, onward)         Start     Ordered   12/27/19 1028  For home use only DME lightweight manual wheelchair with seat cushion  Once       Comments: Patient suffers from chronic deconditioning which impairs their ability to perform daily activities like ambulating in the home.  A walker will not resolve  issue with performing activities of daily living. A wheelchair will allow patient to safely perform daily activities. Patient is not able to propel themselves in  the home using a standard weight wheelchair due to weakness. Patient can self propel in the lightweight wheelchair. Length of need life time  Accessories: elevating leg rests (ELRs), wheel locks, extensions and anti-tippers.   12/27/19 1029         No Known Allergies  Follow-up Information    Marda Stalker, PA-C. Call in 1 day(s).   Specialty: Family Medicine Why: Please call for a post hospital follow-up appointment. Contact information: [place] [address] [phone]        Sanda Klein, MD .   Specialty: Cardiology Contact information: [address] [place] [address] [phone]                The results of significant diagnostics from this hospitalization (including imaging,  microbiology, ancillary and laboratory) are listed below for reference.    Significant Diagnostic Studies: DG Chest 2 View  Result Date: 12/23/2019 CLINICAL DATA:  Cough.  Shortness of breath.  Low-grade fever. EXAM: CHEST - 2 VIEW COMPARISON:  None. FINDINGS: Cardiac silhouette borderline enlarged. No mediastinal or hilar masses or evidence of adenopathy. Lungs are hyperexpanded, but clear. Left anterior chest wall sequential pacemaker is well positioned. Skeletal structures are demineralized but intact. IMPRESSION: No active cardiopulmonary disease. Electronically Signed   By: Lajean Manes M.D.   On: 12/23/2019 13:27   CUP PACEART REMOTE DEVICE CHECK  Result Date: 11/29/2019 Scheduled remote reviewed. Normal device function. 1 AMS events duration 1-3 hours - EGM unavailable for viewing.  A burden <1%.  Known PAF.  Histogram stack at  60bpm.  R. Powers, LPN, CVRS Next remote 91 days.   Microbiology: Recent Results (from the past 240 hour(s))  Resp Panel by RT-PCR (Flu A&B, Covid) Nasopharyngeal Swab     Status: None   Collection Time: 12/23/19  5:01 PM   Specimen: Nasopharyngeal Swab; Nasopharyngeal(NP) swabs in vial transport medium  Result Value Ref Range Status   SARS Coronavirus 2 by RT PCR NEGATIVE NEGATIVE Final    Comment: (NOTE) SARS-CoV-2 target nucleic acids are NOT DETECTED.  The SARS-CoV-2 RNA is generally detectable in upper respiratory specimens during the acute phase of infection. The lowest concentration of SARS-CoV-2 viral copies this assay can detect is 138 copies/mL. A negative result does not preclude SARS-Cov-2 infection and should not be used as the sole basis for treatment or other patient management decisions. A negative result may occur with  improper specimen collection/handling, submission of specimen other than nasopharyngeal swab, presence of viral mutation(s) within the areas targeted by this assay, and inadequate number of viral copies(<138 copies/mL).  A negative result must be combined with clinical observations, patient history, and epidemiological information. The expected result is Negative.  Fact Sheet for Patients:  EntrepreneurPulse.com.au  Fact Sheet for Healthcare Providers:  IncredibleEmployment.be  This test is no t yet approved or cleared by the [place] FDA and  has been authorized for detection and/or diagnosis of SARS-CoV-2 by FDA under an Emergency Use Authorization (EUA). This EUA will remain  in effect (meaning this test can be used) for the duration of the COVID-19 declaration under Section 564(b)(1) of the Act, 21 U.S.C.section 360bbb-3(b)(1), unless the authorization is terminated  or revoked sooner.       Influenza A by PCR NEGATIVE NEGATIVE Final   Influenza B by PCR NEGATIVE NEGATIVE Final    Comment: (NOTE) The Xpert Xpress SARS-CoV-2/FLU/RSV plus assay is intended as an aid in the diagnosis of influenza from Nasopharyngeal swab  specimens and should not be used as a sole basis for treatment. Nasal washings and aspirates are unacceptable for Xpert Xpress SARS-CoV-2/FLU/RSV testing.  Fact Sheet for Patients: EntrepreneurPulse.com.au  Fact Sheet for Healthcare Providers: IncredibleEmployment.be  This test is not yet approved or cleared by the [place] FDA and has been authorized for detection and/or diagnosis of SARS-CoV-2 by FDA under an Emergency Use Authorization (EUA). This EUA will remain in effect (meaning this test can be used) for the duration of the COVID-19 declaration under Section 564(b)(1) of the Act, 21 U.S.C. section 360bbb-3(b)(1), unless the authorization is terminated or revoked.  Performed at [facility], [address] [place], [place] [address]      Labs: Basic Metabolic Panel: Recent Labs  Lab 12/23/19 1253 12/24/19 0320 12/25/19 0443 12/26/19 0452  NA 143 145 141 141  K 4.7  4.0 4.0 3.9  CL 107 111 113* 113*  CO2 25 22 22  21*  GLUCOSE 131* 93 91 88  BUN 50* 45* 35* 24*  CREATININE 3.75* 3.02* 2.42* 1.95*  CALCIUM 10.2 9.1 8.0* 8.2*  MG  --   --  2.0 2.0   Liver Function Tests: Recent Labs  Lab 12/23/19 1253  AST 27  ALT 16  ALKPHOS 44  BILITOT 1.3*  PROT 8.0  ALBUMIN 3.5   No results for input(s): LIPASE, AMYLASE in the last 168 hours. No results for input(s): AMMONIA in the last 168 hours. CBC: Recent Labs  Lab 12/23/19 1253 12/25/19 0443 12/26/19 0452  WBC 10.5 4.8 4.0  NEUTROABS 7.9* 2.8 2.2  HGB 14.3 10.8* 10.7*  HCT 43.5 31.8* 31.9*  MCV 93.3 92.2 91.1  PLT 244 188 203   Cardiac Enzymes: No results for input(s): CKTOTAL, CKMB, CKMBINDEX, TROPONINI in the last 168 hours. BNP: BNP (last 3 results) No results for input(s): BNP in the last 8760 hours.  ProBNP (last 3 results) No results for input(s): PROBNP in the last 8760 hours.  CBG: No results for input(s): GLUCAP in the last 168 hours.     Signed:  Kayleen Memos, MD [facility] 12/27/2019, 1:38 PM

## 2019-12-27 NOTE — Discharge Instructions (Signed)
Urinary Tract Infection, Adult A urinary tract infection (UTI) is an infection of any part of the urinary tract. The urinary tract includes:  The kidneys.  The ureters.  The bladder.  The urethra. These organs make, store, and get rid of pee (urine) in the body. What are the causes? This is caused by germs (bacteria) in your genital area. These germs grow and cause swelling (inflammation) of your urinary tract. What increases the risk? You are more likely to develop this condition if:  You have a small, thin tube (catheter) to drain pee.  You cannot control when you pee or poop (incontinence).  You are female, and: ? You use these methods to prevent pregnancy:  A medicine that kills sperm (spermicide).  A device that blocks sperm (diaphragm). ? You have low levels of a female hormone (estrogen). ? You are pregnant.  You have genes that add to your risk.  You are sexually active.  You take antibiotic medicines.  You have trouble peeing because of: ? A prostate that is bigger than normal, if you are female. ? A blockage in the part of your body that drains pee from the bladder (urethra). ? A kidney stone. ? A nerve condition that affects your bladder (neurogenic bladder). ? Not getting enough to drink. ? Not peeing often enough.  You have other conditions, such as: ? Diabetes. ? A weak disease-fighting system (immune system). ? Sickle cell disease. ? Gout. ? Injury of the spine. What are the signs or symptoms? Symptoms of this condition include:  Needing to pee right away (urgently).  Peeing often.  Peeing small amounts often.  Pain or burning when peeing.  Blood in the pee.  Pee that smells bad or not like normal.  Trouble peeing.  Pee that is cloudy.  Fluid coming from the vagina, if you are female.  Pain in the belly or lower back. Other symptoms include:  Throwing up (vomiting).  No urge to eat.  Feeling mixed up (confused).  Being tired  and grouchy (irritable).  A fever.  Watery poop (diarrhea). How is this treated? This condition may be treated with:  Antibiotic medicine.  Other medicines.  Drinking enough water. Follow these instructions at home:  Medicines  Take over-the-counter and prescription medicines only as told by your doctor.  If you were prescribed an antibiotic medicine, take it as told by your doctor. Do not stop taking it even if you start to feel better. General instructions  Make sure you: ? Pee until your bladder is empty. ? Do not hold pee for a long time. ? Empty your bladder after sex. ? Wipe from front to back after pooping if you are a female. Use each tissue one time when you wipe.  Drink enough fluid to keep your pee pale yellow.  Keep all follow-up visits as told by your doctor. This is important. Contact a doctor if:  You do not get better after 1-2 days.  Your symptoms go away and then come back. Get help right away if:  You have very bad back pain.  You have very bad pain in your lower belly.  You have a fever.  You are sick to your stomach (nauseous).  You are throwing up. Summary  A urinary tract infection (UTI) is an infection of any part of the urinary tract.  This condition is caused by germs in your genital area.  There are many risk factors for a UTI. These include having a small, thin  tube to drain pee and not being able to control when you pee or poop.  Treatment includes antibiotic medicines for germs.  Drink enough fluid to keep your pee pale yellow. This information is not intended to replace advice given to you by your health care provider. Make sure you discuss any questions you have with your health care provider. Document Revised: 12/22/2017 Document Reviewed: 07/14/2017 Elsevier Patient Education  [place] refers to the changes in the body that occur during a period of inactivity. The changes  happen in the heart, lungs, and muscles. They make you feel tired and weak (fatigued) and decrease your ability to be active. The three stages of deconditioning include:  Mild deconditioning. This is a change in your ability to do your usual exercise activities, such as running, biking, or swimming.  Moderate deconditioning. This is a change in your ability to do normal everyday activities, such as walking, shopping for groceries, and doing chores.  Severe deconditioning. In this stage, you may not be able to do minimal activity or usual self-care. What are the causes? Deconditioning can occur after only a few days of inactivity. The longer the period of inactivity, the more severe the deconditioning will be, and the longer it will take to return to your previous level of functioning. Deconditioning is often caused by inactivity due to:  Illnesses, such as cancer, stroke, heart attack, fibromyalgia, and chronic fatigue syndrome.  Injuries, especially back injuries, broken bones, and injuries to soft tissues, such as ligaments and tendons.  A long stay in the hospital.  Pregnancy, especially if long periods of bed rest are needed. What increases the risk? The following factors may make you more likely to develop this condition:  Staying in the hospital or being on bed rest.  Obesity.  Poor nutrition.  Being an older adult.  Having an injury or illness that affects your movement and activity. What are the signs or symptoms? Symptoms of this condition include:  Weakness and tiredness.  Shortness of breath with minor physical effort (exertion).  A heartbeat that is faster than normal. You may not notice this without taking your pulse.  Pain or discomfort with activity.  Decreased strength, endurance, and balance.  Difficulty doing your usual forms of exercise.  Difficulty doing activities of daily living, such as grocery shopping or chores. You may also have problems walking  around the house and doing basic self-care, such as getting to the bathroom, preparing meals, or doing laundry. How is this diagnosed? This condition is diagnosed based on your medical history and a physical exam. During the physical exam, your health care provider will check for signs of deconditioning, such as:  Decreased size of muscles.  Decreased strength.  Trouble with balance.  Shortness of breath or a heart rate that is faster than normal after minor exertion. How is this treated? Treatment for this condition involves an exercise program in which activity is increased slowly. Your health care provider will tell you which exercises are right for you. The exercise program will likely include:  Aerobic exercise. This type of exercise helps improve the functioning of the heart, lungs, and muscles.  Strength training. This type of exercise helps increase muscle size and strength. Both of these types of exercise will improve your endurance. You may be referred to a physical therapist who can create a safe strengthening program for you to follow. Follow these instructions at home: Eating and drinking   Eat a  healthy, well-balanced diet. This includes: ? Proteins, such as lean meats and fish, to build muscles. ? Fresh fruits and vegetables. ? Carbohydrates, such as whole grains, to boost energy.  Drink enough fluid to keep your urine pale yellow. Activity   Follow the exercise program that is recommended by your health care provider or physical therapist.  Do not increase your exercise any faster than directed. General instructions  Take over-the-counter and prescription medicines only as told by your health care provider.  Do not use any products that contain nicotine or tobacco, such as cigarettes, e-cigarettes, and chewing tobacco. If you need help quitting, ask your health care provider.  Keep all follow-up visits as told by your health care provider. This is  important. Contact a health care provider if:  You are not able to do the recommended exercise program.  You are becoming more and more tired and weak.  You become light-headed when rising to a sitting or standing position.  Your level of endurance decreases after it has improved. Get help right away if you:  Have chest pain.  Are very short of breath.  Have any episodes of fainting. Summary  Deconditioning refers to the changes in the body that occur during a period of inactivity.  Deconditioning happens in the heart, lungs, and muscles. The changes make you feel tired and weak and decrease your ability to be active.  Treatment for deconditioning involves an exercise program in which activity is increased slowly. This information is not intended to replace advice given to you by your health care provider. Make sure you discuss any questions you have with your health care provider. Document Revised: 06/01/2018 Document Reviewed: 06/01/2018 Elsevier Patient Education  [place].

## 2019-12-27 NOTE — Progress Notes (Signed)
ANTICOAGULATION CONSULT NOTE - Initial Consult  Pharmacy Consult for wafarin management Indication: atrial fibrillation  No Known Allergies  Patient Measurements: Height: 5\' 3"  (160 cm) Weight: 62.1 kg (136 lb 14.4 oz) IBW/kg (Calculated) : 52.4   Vital Signs: Temp: 98.2 F (36.8 C) (12/09 0757) Temp Source: Oral (12/09 0757) BP: 121/71 (12/09 0757) Pulse Rate: 59 (12/09 0757)  Labs: Recent Labs    12/25/19 0443 12/26/19 0452 12/27/19 0510  HGB 10.8* 10.7*  --   HCT 31.8* 31.9*  --   PLT 188 203  --   LABPROT 28.6* 29.8* 25.9*  INR 2.8* 3.0* 2.5*  CREATININE 2.42* 1.95*  --     Estimated Creatinine Clearance: 16.8 mL/min (A) (by C-G formula based on SCr of 1.95 mg/dL (H)).   Medical History: Past Medical History:  Diagnosis Date  . Alzheimer's disease ([place])   . Atrial fibrillation ([place])   . Chronic kidney disease    Stage 4  . CVA (cerebrovascular accident) ([place])   . DVT (deep venous thrombosis) ([place]) 2013   LLE  . Hyperlipidemia   . Hypertension   . Pacemaker   . Pulmonary embolism (HCC)     DD Interactions:  Cefepime 12/5 >   Assessment: Patient on chronic warfarin therapy for atrial fibrillation and history of DVT/PE. Patient follows with outpatient anticoagulation clinic. Home dose is 3 mg PO daily (weekly total dose of 21 mg). INR 2.0 (therapeutic) on admission. Cefepime IV started 12/5 for UTI  Date INR Dose 12/5 2.0 3 mg (home dose)  12/6 2.5 3 mg  12/7 2.8 1 mg -- not given 12/8 3.0 1 mg  12/9 2.5 3 mg (anticipate INR to continue to trend down tomorrow)  Goal of Therapy:  INR 2-3    Plan:  --Will give home dose of 3 mg today --Anticipate able to resume usual home dose once antibiotics are discontinued --INR daily and CBC every 3 days while on therapy  Dorothe Pea, PharmD, BCPS 12/27/2019,8:37 AM

## 2019-12-27 NOTE — Progress Notes (Signed)
Mobility Specialist - Progress Note   12/27/19 1200  Mobility  Range of Motion/Exercises Right leg;Left leg (straight leg raises, wash face, hand grasp)  Level of Assistance Maximum assist, patient does 25-49%  Assistive Device None  Distance Ambulated (ft) 0 ft  Mobility Response Tolerated well  Mobility performed by Mobility specialist  $Mobility charge 1 Mobility    Pt was lying in bed upon arrival. Pt agreed to session. Pt cognitively unaware of self, location, date or time. Pt constantly tugs on garments causing tubes and lines to tangle, mobility resolves issue with detangling and redirection. Pt attempted to wash face, perform hand grip, and straight leg raises all with maxA from mobility however. Max verbal and tactile cueing. Pt demonstrates poor carryover d/t cognition deficits. Pt left in bed with all needs in reach and alarm set.   Kathee Delton Mobility Specialist 12/27/19, 12:40 PM

## 2020-01-08 DIAGNOSIS — I480 Paroxysmal atrial fibrillation: Secondary | ICD-10-CM | POA: Diagnosis not present

## 2020-01-08 DIAGNOSIS — G9341 Metabolic encephalopathy: Secondary | ICD-10-CM | POA: Diagnosis not present

## 2020-01-08 DIAGNOSIS — Z950 Presence of cardiac pacemaker: Secondary | ICD-10-CM | POA: Diagnosis not present

## 2020-01-08 DIAGNOSIS — N179 Acute kidney failure, unspecified: Secondary | ICD-10-CM | POA: Diagnosis not present

## 2020-01-17 ENCOUNTER — Other Ambulatory Visit: Payer: Self-pay | Admitting: Cardiovascular Disease

## 2020-01-22 ENCOUNTER — Inpatient Hospital Stay
Admission: EM | Admit: 2020-01-22 | Discharge: 2020-02-06 | DRG: 853 | Disposition: A | Discharge status: Hospice | Payer: Medicare Other | Attending: Student | Admitting: Student

## 2020-01-22 ENCOUNTER — Emergency Department: Payer: Medicare Other

## 2020-01-22 ENCOUNTER — Other Ambulatory Visit: Payer: Self-pay

## 2020-01-22 ENCOUNTER — Encounter: Payer: Self-pay | Admitting: *Deleted

## 2020-01-22 DIAGNOSIS — I129 Hypertensive chronic kidney disease with stage 1 through stage 4 chronic kidney disease, or unspecified chronic kidney disease: Secondary | ICD-10-CM | POA: Diagnosis not present

## 2020-01-22 DIAGNOSIS — A4189 Other specified sepsis: Principal | ICD-10-CM | POA: Diagnosis present

## 2020-01-22 DIAGNOSIS — Z7189 Other specified counseling: Secondary | ICD-10-CM

## 2020-01-22 DIAGNOSIS — E871 Hypo-osmolality and hyponatremia: Secondary | ICD-10-CM | POA: Diagnosis not present

## 2020-01-22 DIAGNOSIS — Z950 Presence of cardiac pacemaker: Secondary | ICD-10-CM

## 2020-01-22 DIAGNOSIS — N189 Chronic kidney disease, unspecified: Secondary | ICD-10-CM | POA: Diagnosis not present

## 2020-01-22 DIAGNOSIS — Z79899 Other long term (current) drug therapy: Secondary | ICD-10-CM

## 2020-01-22 DIAGNOSIS — Z20822 Contact with and (suspected) exposure to covid-19: Secondary | ICD-10-CM | POA: Diagnosis present

## 2020-01-22 DIAGNOSIS — Z66 Do not resuscitate: Secondary | ICD-10-CM | POA: Diagnosis present

## 2020-01-22 DIAGNOSIS — D72829 Elevated white blood cell count, unspecified: Secondary | ICD-10-CM | POA: Diagnosis not present

## 2020-01-22 DIAGNOSIS — Z960 Presence of urogenital implants: Secondary | ICD-10-CM | POA: Diagnosis not present

## 2020-01-22 DIAGNOSIS — R6521 Severe sepsis with septic shock: Secondary | ICD-10-CM | POA: Diagnosis present

## 2020-01-22 DIAGNOSIS — E785 Hyperlipidemia, unspecified: Secondary | ICD-10-CM | POA: Diagnosis present

## 2020-01-22 DIAGNOSIS — R64 Cachexia: Secondary | ICD-10-CM | POA: Diagnosis present

## 2020-01-22 DIAGNOSIS — I480 Paroxysmal atrial fibrillation: Secondary | ICD-10-CM | POA: Diagnosis present

## 2020-01-22 DIAGNOSIS — N202 Calculus of kidney with calculus of ureter: Secondary | ICD-10-CM | POA: Diagnosis present

## 2020-01-22 DIAGNOSIS — B964 Proteus (mirabilis) (morganii) as the cause of diseases classified elsewhere: Secondary | ICD-10-CM | POA: Diagnosis present

## 2020-01-22 DIAGNOSIS — R55 Syncope and collapse: Secondary | ICD-10-CM | POA: Diagnosis not present

## 2020-01-22 DIAGNOSIS — Z515 Encounter for palliative care: Secondary | ICD-10-CM | POA: Diagnosis not present

## 2020-01-22 DIAGNOSIS — Z8673 Personal history of transient ischemic attack (TIA), and cerebral infarction without residual deficits: Secondary | ICD-10-CM

## 2020-01-22 DIAGNOSIS — Z86711 Personal history of pulmonary embolism: Secondary | ICD-10-CM

## 2020-01-22 DIAGNOSIS — N139 Obstructive and reflux uropathy, unspecified: Secondary | ICD-10-CM | POA: Diagnosis not present

## 2020-01-22 DIAGNOSIS — Z87891 Personal history of nicotine dependence: Secondary | ICD-10-CM

## 2020-01-22 DIAGNOSIS — E870 Hyperosmolality and hypernatremia: Secondary | ICD-10-CM | POA: Diagnosis not present

## 2020-01-22 DIAGNOSIS — F0280 Dementia in other diseases classified elsewhere without behavioral disturbance: Secondary | ICD-10-CM | POA: Diagnosis present

## 2020-01-22 DIAGNOSIS — I248 Other forms of acute ischemic heart disease: Secondary | ICD-10-CM | POA: Diagnosis not present

## 2020-01-22 DIAGNOSIS — G9341 Metabolic encephalopathy: Secondary | ICD-10-CM | POA: Diagnosis present

## 2020-01-22 DIAGNOSIS — D696 Thrombocytopenia, unspecified: Secondary | ICD-10-CM | POA: Diagnosis not present

## 2020-01-22 DIAGNOSIS — N136 Pyonephrosis: Secondary | ICD-10-CM | POA: Diagnosis not present

## 2020-01-22 DIAGNOSIS — Z818 Family history of other mental and behavioral disorders: Secondary | ICD-10-CM

## 2020-01-22 DIAGNOSIS — N201 Calculus of ureter: Secondary | ICD-10-CM

## 2020-01-22 DIAGNOSIS — R7881 Bacteremia: Secondary | ICD-10-CM | POA: Diagnosis not present

## 2020-01-22 DIAGNOSIS — I739 Peripheral vascular disease, unspecified: Secondary | ICD-10-CM | POA: Diagnosis not present

## 2020-01-22 DIAGNOSIS — G9389 Other specified disorders of brain: Secondary | ICD-10-CM | POA: Diagnosis not present

## 2020-01-22 DIAGNOSIS — Z86718 Personal history of other venous thrombosis and embolism: Secondary | ICD-10-CM

## 2020-01-22 DIAGNOSIS — R5381 Other malaise: Secondary | ICD-10-CM | POA: Diagnosis not present

## 2020-01-22 DIAGNOSIS — N179 Acute kidney failure, unspecified: Secondary | ICD-10-CM

## 2020-01-22 DIAGNOSIS — I4891 Unspecified atrial fibrillation: Secondary | ICD-10-CM | POA: Diagnosis not present

## 2020-01-22 DIAGNOSIS — N184 Chronic kidney disease, stage 4 (severe): Secondary | ICD-10-CM | POA: Diagnosis present

## 2020-01-22 DIAGNOSIS — G309 Alzheimer's disease, unspecified: Secondary | ICD-10-CM | POA: Diagnosis present

## 2020-01-22 DIAGNOSIS — B9620 Unspecified Escherichia coli [E. coli] as the cause of diseases classified elsewhere: Secondary | ICD-10-CM | POA: Diagnosis present

## 2020-01-22 DIAGNOSIS — N200 Calculus of kidney: Secondary | ICD-10-CM | POA: Diagnosis not present

## 2020-01-22 DIAGNOSIS — F0390 Unspecified dementia without behavioral disturbance: Secondary | ICD-10-CM

## 2020-01-22 DIAGNOSIS — Q6211 Congenital occlusion of ureteropelvic junction: Secondary | ICD-10-CM

## 2020-01-22 DIAGNOSIS — A4151 Sepsis due to Escherichia coli [E. coli]: Secondary | ICD-10-CM | POA: Diagnosis not present

## 2020-01-22 DIAGNOSIS — N132 Hydronephrosis with renal and ureteral calculous obstruction: Secondary | ICD-10-CM | POA: Diagnosis not present

## 2020-01-22 DIAGNOSIS — Z8249 Family history of ischemic heart disease and other diseases of the circulatory system: Secondary | ICD-10-CM

## 2020-01-22 DIAGNOSIS — R1110 Vomiting, unspecified: Secondary | ICD-10-CM | POA: Diagnosis not present

## 2020-01-22 DIAGNOSIS — K5730 Diverticulosis of large intestine without perforation or abscess without bleeding: Secondary | ICD-10-CM | POA: Diagnosis present

## 2020-01-22 DIAGNOSIS — N390 Urinary tract infection, site not specified: Secondary | ICD-10-CM | POA: Diagnosis not present

## 2020-01-22 DIAGNOSIS — A419 Sepsis, unspecified organism: Secondary | ICD-10-CM

## 2020-01-22 DIAGNOSIS — N171 Acute kidney failure with acute cortical necrosis: Secondary | ICD-10-CM | POA: Diagnosis not present

## 2020-01-22 DIAGNOSIS — Z6823 Body mass index (BMI) 23.0-23.9, adult: Secondary | ICD-10-CM

## 2020-01-22 DIAGNOSIS — R34 Anuria and oliguria: Secondary | ICD-10-CM | POA: Diagnosis present

## 2020-01-22 DIAGNOSIS — Z7901 Long term (current) use of anticoagulants: Secondary | ICD-10-CM

## 2020-01-22 DIAGNOSIS — R197 Diarrhea, unspecified: Secondary | ICD-10-CM | POA: Diagnosis not present

## 2020-01-22 DIAGNOSIS — R1111 Vomiting without nausea: Secondary | ICD-10-CM | POA: Diagnosis not present

## 2020-01-22 DIAGNOSIS — I495 Sick sinus syndrome: Secondary | ICD-10-CM | POA: Diagnosis present

## 2020-01-22 DIAGNOSIS — R410 Disorientation, unspecified: Secondary | ICD-10-CM

## 2020-01-22 DIAGNOSIS — Z823 Family history of stroke: Secondary | ICD-10-CM

## 2020-01-22 DIAGNOSIS — R6520 Severe sepsis without septic shock: Secondary | ICD-10-CM | POA: Diagnosis not present

## 2020-01-22 DIAGNOSIS — R531 Weakness: Secondary | ICD-10-CM | POA: Diagnosis not present

## 2020-01-22 DIAGNOSIS — N1330 Unspecified hydronephrosis: Secondary | ICD-10-CM | POA: Diagnosis not present

## 2020-01-22 LAB — BASIC METABOLIC PANEL
Anion gap: 16 — ABNORMAL HIGH (ref 5–15)
BUN: 44 mg/dL — ABNORMAL HIGH (ref 8–23)
CO2: 20 mmol/L — ABNORMAL LOW (ref 22–32)
Calcium: 10.3 mg/dL (ref 8.9–10.3)
Chloride: 110 mmol/L (ref 98–111)
Creatinine, Ser: 3.48 mg/dL — ABNORMAL HIGH (ref 0.44–1.00)
GFR, Estimated: 12 mL/min — ABNORMAL LOW (ref >60)
Glucose, Bld: 183 mg/dL — ABNORMAL HIGH (ref 70–99)
Potassium: 3.5 mmol/L (ref 3.5–5.1)
Sodium: 146 mmol/L — ABNORMAL HIGH (ref 135–145)

## 2020-01-22 LAB — CBC
HCT: 39.5 % (ref 36.0–46.0)
Hemoglobin: 12.9 g/dL (ref 12.0–15.0)
MCH: 30.9 pg (ref 26.0–34.0)
MCHC: 32.7 g/dL (ref 30.0–36.0)
MCV: 94.5 fL (ref 80.0–100.0)
Platelets: 185 10*3/uL (ref 150–400)
RBC: 4.18 MIL/uL (ref 3.87–5.11)
RDW: 14.8 % (ref 11.5–15.5)
WBC: 11.7 10*3/uL — ABNORMAL HIGH (ref 4.0–10.5)
nRBC: 0 % (ref 0.0–0.2)

## 2020-01-22 LAB — TROPONIN I (HIGH SENSITIVITY): Troponin I (High Sensitivity): 61 ng/L — ABNORMAL HIGH (ref <18)

## 2020-01-22 MED ORDER — LACTATED RINGERS IV BOLUS (SEPSIS)
1000.0000 mL | Freq: Once | INTRAVENOUS | Status: AC
  Administered 2020-01-23: 1000 mL via INTRAVENOUS

## 2020-01-22 NOTE — ED Triage Notes (Addendum)
Pt brought in from home by caregiver.  Pt has weakness with vomiting and diarrhea. Vomited x 5 tonight.  Diarrhea x 1.  Pt passed out after vomiting per caregiver.  Hx dementia

## 2020-01-22 NOTE — ED Provider Notes (Addendum)
[facility] Emergency Department Provider Note  ____________________________________________   Event Date/Time   First MD Initiated Contact with Patient 01/22/20 2320     (approximate)  I have reviewed the triage vital signs and the nursing notes.   HISTORY  Chief Complaint Weakness and Emesis  Level 5 caveat:  history/ROS limited by chronic dementia as well as acute illness.  History provided entirely by the patient's niece who is her caregiver.  HPI Hailey Lopez is a 87 y.o. female with medical history as listed below which notably includes Alzheimer's disease and A. fib on warfarin as well as a history of chronic kidney disease.  She presents for evaluation of acute onset and severe symptoms that include multiple episodes of vomiting, an episode of diarrhea, and syncope after having the vomiting and diarrhea.  She is much weaker than normal and is unable to walk for herself which is usually not a problem.  She is answering simple yes/no questions but has decreased mental status compared to baseline, and spite of her history of dementia.  Her niece reports that she has been in her normal state of health until acute onset of severe symptoms tonight.  She was treated for urinary tract infection about a month ago but has been doing okay.  She has not complained of chest pain, shortness of breath, nor abdominal pain.  Initially the niece was concerned about the patient not moving her right arm very much but she has been moving normally since that time.  The patient has not had a fever.  Nothing in particular makes the symptoms better or worse.         Past Medical History:  Diagnosis Date  . Alzheimer's disease ([place])   . Atrial fibrillation ([place])   . Chronic kidney disease    Stage 4  . CVA (cerebrovascular accident) ([place])   . DVT (deep venous thrombosis) ([place]) 2013   LLE  . Hyperlipidemia   . Hypertension   . Pacemaker   . Pulmonary embolism [facility])      Patient Active Problem List   Diagnosis Date Noted  . Sepsis ([place]) 01/23/2020  . Acute metabolic encephalopathy 65/78/4696  . Lactic acidosis 12/24/2019  . Acute renal failure superimposed on stage 4 chronic kidney disease ([place]) 12/24/2019  . UTI (urinary tract infection) 12/23/2019  . Long term (current) use of anticoagulants 07/31/2018  . SSS (sick sinus syndrome) ([place]) 11/25/2017  . Paroxysmal atrial fibrillation ([place]) 11/25/2017  . Pacemaker 11/25/2017  . History of pulmonary embolism 11/25/2017  . CKD (chronic kidney disease) stage 4, GFR 15-29 ml/min (HCC) 11/25/2017  . Essential hypertension 11/25/2017  . Hypercholesterolemia 11/25/2017    Past Surgical History:  Procedure Laterality Date  . CATARACT EXTRACTION, BILATERAL    . PACEMAKER IMPLANT  2013    Prior to Admission medications   Medication Sig Start Date End Date Taking? Authorizing Provider  atorvastatin (LIPITOR) 10 MG tablet Take 10 mg by mouth daily.    [provider]  calcitRIOL (ROCALTROL) 0.5 MCG capsule Take 0.5 mcg by mouth at bedtime.     [provider]  donepezil (ARICEPT) 5 MG tablet Take 5 mg by mouth at bedtime. 08/08/19   [provider]  isosorbide dinitrate (ISORDIL) 5 MG tablet Take 5 mg by mouth daily.     [provider]  polyethylene glycol (MIRALAX / GLYCOLAX) packet Take 17 g by mouth daily as needed for mild constipation or moderate constipation.  [provider]  warfarin (COUMADIN) 3 MG tablet Take 1 tablet by mouth once daily 01/17/20   Skeet Latch, MD    Allergies Patient has no known allergies.  Family History  Problem Relation Age of Onset  . Dementia Mother   . Heart attack Father   . Stroke Father   . Hypertension Father   . Heart attack Sister   . Heart attack Brother   . Heart attack Brother   . Stroke Brother   . Hypertension Brother   . Diabetes Brother   . Hyperlipidemia Sister   . Dementia Sister      Social History Social History   Tobacco Use  . Smoking status: Former Research scientist (life sciences)  . Smokeless tobacco: Never Used  Substance Use Topics  . Alcohol use: Not Currently  . Drug use: Never    Review of Systems Level 5 caveat:  history/ROS limited by chronic dementia as well as acute illness.  History provided entirely by the patient's niece who is her caregiver.  Constitutional: No fever/chills Eyes: No visual changes. ENT: No sore throat. Cardiovascular: No reported chest pain.  Syncopal episode after vomiting and diarrhea. Respiratory: Denies shortness of breath.  No recent cough. Gastrointestinal: Multiple episodes of vomiting with one episode of diarrhea. Genitourinary: Negative for dysuria. Musculoskeletal: Negative for neck pain.  Negative for back pain. Integumentary: Negative for rash. Neurological: After syncope patient was not moving her right arm very much but that seems to have returned to baseline.   ____________________________________________   PHYSICAL EXAM:  VITAL SIGNS: ED Triage Vitals  Enc Vitals Group     BP 01/22/20 2050 (!) 145/108     Pulse Rate 01/22/20 2050 (!) 136     Resp 01/22/20 2050 16     Temp 01/22/20 2050 98.6 F (37 C)     Temp src --      SpO2 01/22/20 2050 94 %     Weight 01/22/20 2056 63.5 kg (140 lb)     Height 01/22/20 2056 1.626 m (5\' 4" )     Head Circumference --      Peak Flow --      Pain Score --      Pain Loc --      Pain Edu? --      Excl. in [place]? --     Constitutional: Awake, responds "no" when asked if she is in pain in specific areas, otherwise is not answering questions or responding to instructions. Eyes: Conjunctivae are normal.  Head: Atraumatic. Nose: No congestion/rhinnorhea. Mouth/Throat: Patient is wearing a mask. Neck: No stridor.  No meningeal signs.   Cardiovascular: Mild tachycardia, regular rhythm. Good peripheral circulation.  Pacemaker is in place. Respiratory: Normal respiratory effort.  No  retractions. Gastrointestinal: Soft and nontender. No distention.  Patient denies any pain or tenderness during abdominal palpation. Musculoskeletal: No lower extremity tenderness nor edema. No gross deformities of extremities. Neurologic: Patient speaks very quietly and answers yes and no questions, otherwise is not communicative.  Cannot follow commands for neurological exam but I do not appreciate any focal neurological deficits at this time. Skin:  Skin is warm, dry and intact.   ____________________________________________   LABS (all labs ordered are listed, but only abnormal results are displayed)  Labs Reviewed  BASIC METABOLIC PANEL - Abnormal; Notable for the following components:      Result Value   Sodium 146 (*)    CO2 20 (*)    Glucose, Bld 183 (*)  BUN 44 (*)    Creatinine, Ser 3.48 (*)    GFR, Estimated 12 (*)    Anion gap 16 (*)    All other components within normal limits  CBC - Abnormal; Notable for the following components:   WBC 11.7 (*)    All other components within normal limits  HEPATIC FUNCTION PANEL - Abnormal; Notable for the following components:   Albumin 3.3 (*)    Bilirubin, Direct 0.3 (*)    All other components within normal limits  LIPASE, BLOOD - Abnormal; Notable for the following components:   Lipase 69 (*)    All other components within normal limits  LACTIC ACID, PLASMA - Abnormal; Notable for the following components:   Lactic Acid, Venous 8.1 (*)    All other components within normal limits  LACTIC ACID, PLASMA - Abnormal; Notable for the following components:   Lactic Acid, Venous 3.3 (*)    All other components within normal limits  PROTIME-INR - Abnormal; Notable for the following components:   Prothrombin Time 22.8 (*)    INR 2.1 (*)    All other components within normal limits  URINALYSIS, COMPLETE (UACMP) WITH MICROSCOPIC - Abnormal; Notable for the following components:   Color, Urine YELLOW (*)    APPearance CLOUDY (*)     Hgb urine dipstick MODERATE (*)    Protein, ur 100 (*)    Leukocytes,Ua MODERATE (*)    WBC, UA >50 (*)    All other components within normal limits  TROPONIN I (HIGH SENSITIVITY) - Abnormal; Notable for the following components:   Troponin I (High Sensitivity) 61 (*)    All other components within normal limits  TROPONIN I (HIGH SENSITIVITY) - Abnormal; Notable for the following components:   Troponin I (High Sensitivity) 214 (*)    All other components within normal limits  URINE CULTURE  CULTURE, BLOOD (ROUTINE X 2)  CULTURE, BLOOD (ROUTINE X 2)  SARS CORONAVIRUS 2 (TAT 6-24 HRS)  APTT  PROCALCITONIN  POC SARS CORONAVIRUS 2 AG -  ED   ____________________________________________  EKG  ED ECG REPORT I, Hinda Kehr, the attending physician, personally viewed and interpreted this ECG.  Date: 01/22/2020 EKG Time: 21: 05 Rate: 135 Rhythm: Sinus tachycardia QRS Axis: normal Intervals: Left anterior fascicular block ST/T Wave abnormalities: Non-specific ST segment / T-wave changes, but no clear evidence of acute ischemia. Narrative Interpretation: no definitive evidence of acute ischemia; does not meet STEMI criteria.  ED ECG REPORT I, Hinda Kehr, the attending physician, personally viewed and interpreted this ECG.  Date: 01/23/2020 EKG Time: 00: 57 Rate: 92 Rhythm: normal sinus rhythm QRS Axis: normal Intervals: Widened QTC at 625 ms ST/T Wave abnormalities: Non-specific ST segment / T-wave changes, but no clear evidence of acute ischemia. Narrative Interpretation: no definitive evidence of acute ischemia; does not meet STEMI criteria.     ____________________________________________  RADIOLOGY I, Hinda Kehr, personally viewed and evaluated these images (plain radiographs) as part of my medical decision making, as well as reviewing the written report by the radiologist.  ED MD interpretation: No evidence of pneumonia.  Official radiology report(s): CT  ABDOMEN PELVIS WO CONTRAST  Result Date: 01/23/2020 CLINICAL DATA:  Bowel obstruction, vomiting, diarrhea EXAM: CT ABDOMEN AND PELVIS WITHOUT CONTRAST TECHNIQUE: Multidetector CT imaging of the abdomen and pelvis was performed following the standard protocol without IV contrast. COMPARISON:  10/07/2017 FINDINGS: Lower chest: There is left-sided volume loss again identified with mediastinal shift to the left. Mild global cardiomegaly.  Pacemaker leads are seen within the right atrium and within the right ventricle embedded within the intraventricular septum. No pericardial effusion. Hepatobiliary: No focal liver abnormality is seen. No gallstones, gallbladder wall thickening, or biliary dilatation. Pancreas: Unremarkable Spleen: Unremarkable Adrenals/Urinary Tract: There has developed mild left hydronephrosis and perinephric stranding secondary to an obstructing 8 mm x 15 mm x 15 mm calculus within the left ureteropelvic junction. An additional 10 mm staghorn calculus is seen within the terminal lower pole calyx. Stable exophytic simple cortical cyst noted within the left kidney. The right kidney is unremarkable; no intrarenal or ureteral calculi are noted on the right and there is no hydronephrosis on the right. The bladder is unremarkable. Stomach/Bowel: Moderate sigmoid diverticulosis. Mild diverticulosis of the cecum also noted. The stomach, small bowel, and large bowel are otherwise unremarkable. No evidence of obstruction or focal inflammation. The appendix is absent. Vascular/Lymphatic: Extensive aortoiliac atherosclerotic calcification. Extensive calcification noted within the mid and distal superior mesenteric artery. No aortic aneurysm. Inferior vena cava filter in expected position within the infrarenal cava. No pathologic adenopathy within the abdomen and pelvis. Reproductive: Large involuted densely calcified fibroid noted within the uterine fundus. The endometrium is thickened, abnormal in a patient of  this age in absence of hormonal stimulation. No adnexal masses are seen. Other: Small fat containing umbilical hernia. Musculoskeletal: Degenerative changes are seen within the lumbar spine. No lytic or blastic bone lesion is seen. IMPRESSION: Interval development of a mild left hydronephrosis and perinephric stranding secondary to an obstructing 15 mm calculus within the left ureteropelvic junction. Superimposed nonobstructing lower pole nephrolithiasis. Peripheral vascular disease with extensive calcification within the a mid superior mesenteric artery. Patency of this vessel is not well characterized on this examination. Correlation for signs and symptoms of chronic mesenteric ischemia may be helpful. Thickening of the endometrium, abnormal in a patient of this age in absence of hormonal stimulation. Dedicated sonography and possible endometrial biopsy may be helpful for further management. Aortic Atherosclerosis (ICD10-I70.0). Electronically Signed   By: Fidela Salisbury MD   On: 01/23/2020 01:14   DG Chest 2 View  Result Date: 01/22/2020 CLINICAL DATA:  Weakness. EXAM: CHEST - 2 VIEW COMPARISON:  December 23, 2019 FINDINGS: The heart size and mediastinal contours are within normal limits. Both lungs are clear. The visualized skeletal structures are unremarkable. There is a well-positioned dual chamber left-sided pacemaker in place. IMPRESSION: No active cardiopulmonary disease. Electronically Signed   By: Constance Holster M.D.   On: 01/22/2020 21:52   CT Head Wo Contrast  Result Date: 01/23/2020 CLINICAL DATA:  Recurrent syncope, vomiting, diarrhea EXAM: CT HEAD WITHOUT CONTRAST TECHNIQUE: Contiguous axial images were obtained from the base of the skull through the vertex without intravenous contrast. COMPARISON:  09/19/2018 FINDINGS: Brain: Normal anatomic configuration. Parenchymal volume loss is commensurate with the patient's age. Moderate subcortical and periventricular white matter changes are  present likely reflecting the sequela of small vessel ischemia. Remote lacunar infarct noted within the anterior limb of the right internal capsule. No abnormal intra or extra-axial mass lesion or fluid collection. No abnormal mass effect or midline shift. No evidence of acute intracranial hemorrhage or infarct. Borderline enlargement of the lateral ventricles likely reflects ex vacuo dilation secondary to central atrophy. Cerebellum unremarkable. Vascular: No asymmetric hyperdense vasculature at the skull base. Extensive vascular calcifications are noted within the carotid siphons bilaterally. Skull: Intact Sinuses/Orbits: Paranasal sinuses are clear. Orbits are unremarkable. Other: There is opacification of several inferior left mastoid air cells without  associated cortical erosion. Remaining mastoid air cells and middle ear cavities bilaterally are clear. IMPRESSION: No acute intracranial hemorrhage or infarct. Advanced senescent changes. Borderline ventricular enlargement. Electronically Signed   By: Fidela Salisbury MD   On: 01/23/2020 01:02   DG OR UROLOGY CYSTO IMAGE ([place])  Result Date: 01/23/2020 There is no interpretation for this exam.  This order is for images obtained during a surgical procedure.  Please See "Surgeries" Tab for more information regarding the procedure.    ____________________________________________   PROCEDURES   Procedure(s) performed (including Critical Care):  .1-3 Lead EKG Interpretation Performed by: Hinda Kehr, MD Authorized by: Hinda Kehr, MD     Interpretation: abnormal     ECG rate:  102   ECG rate assessment: tachycardic     Rhythm: sinus tachycardia     Ectopy: none     Conduction: normal    .Critical Care Performed by: Hinda Kehr, MD Authorized by: Hinda Kehr, MD   Critical care provider statement:    Critical care time (minutes):  60   Critical care time was exclusive of:  Separately billable procedures and treating other  patients   Critical care was necessary to treat or prevent imminent or life-threatening deterioration of the following conditions:  Sepsis and shock   Critical care was time spent personally by me on the following activities:  Development of treatment plan with patient or surrogate, discussions with consultants, evaluation of patient's response to treatment, examination of patient, obtaining history from patient or surrogate, ordering and performing treatments and interventions, ordering and review of laboratory studies, ordering and review of radiographic studies, pulse oximetry, re-evaluation of patient's condition and review of old charts     ____________________________________________   Story / MDM / [place] / ED COURSE  As part of my medical decision making, I reviewed the following data within the electronic MEDICAL RECORD NUMBER History obtained from family, Nursing notes reviewed and incorporated, Labs reviewed , EKG interpreted , Old chart reviewed, Radiograph reviewed , Discussed with admitting physician Darel Hong, ICU), Discussed with urologist (Dr. Bernardo Heater) , A consult was requested and obtained from this/these consultant(s) Urology and Notes from prior ED visits   Differential diagnosis includes, but is not limited to, sepsis, urinary tract infection, pneumonia, SBO/ileus, intra-abdominal infection such as appendicitis or diverticulitis, CVA, ACS.  Patient is hypotensive with mild tachycardia.  Concern for sepsis.  Initial lab work is notable for very mild leukocytosis and a slight elevation of troponin at 61, likely demand ischemia.  Labs notable for acute on chronic kidney injury/renal failure with elevated BUN and creatinine of 3.48.  Patient is also slightly hypernatremic but minimally so.  I personally reviewed the patient's imaging and agree with the radiologist's interpretation that there is no evidence of acute pneumonia on chest x-ray.  The patient  is on the cardiac monitor to evaluate for evidence of arrhythmia and/or significant heart rate changes.  1 L lactated Ringer's, additional "sepsis" labs, CT without contrast of head given report of neurological deficit which has resolved, CT abdomen/pelvis without contrast given renal failure to assess for the possibility of infection versus SBO/ileus.  In and out catheterization for urine, likely followed with empiric antibiotics.  Close monitoring of blood pressure.  Anticipate admission.  Niece agrees with the plan.     Clinical Course as of 01/23/20 0300  Wed Jan 23, 2020  0005 Of note the patient is fully vaccinated for COVID-19 and has had a booster.  Given that she is not symptomatic with traditional COVID symptoms, I am ordering the 6 to 24-hour Covid test.  She has not appropriate for the rapid antigen test. [CF]  0008 Troponin I (High Sensitivity)(!!): 214 Troponin going up from 61 to 214.  Awaiting INR before starting heparin since patient is on warfarin. [CF]  0008 Lactic Acid, Venous(!!): 8.1 [CF]  0057 Lactic Acid, Venous(!!): 8.1 Highly concerning for septic shock, initiating aggressive sepsis prototcol [CF]  0107 UA suggests persistent UTI (maybe from last month) and probable source of her infection/sepsis [CF]  0110 Procalcitonin: 80.51 Highly concerning for overwhelming sepsis [CF]  0122 obstructing 8 mm x 15 mm x 15 mm calculus within the left ureteropelvic junction with hydronephrosis.  Very concerning given the current septic shock presentation.  Paging Dr. Bernardo Heater with urology   [CF]  [phone] Spoke with Dr. Bernardo Heater who is on the way to talk with family, see the patient, and take her to the OR.  Doing rapid antigen COVID test since she is asymptomatic, fully vaccinated and "boosted", and PCR is pending and she needs emergent surgery.  Will talk to ICU NP for admission. [CF]  9604 Discussed case by phone with Darel Hong, the ICU NP.  He will admit the patient.  I included  both him and Dr. Bernardo Heater on a secure chat message with the patient information.  I also updated the niece at bedside.  Anticipate emergency surgery with Dr. Bernardo Heater. [CF]  0257 Lactic Acid, Venous(!!): 3.3 Sepsis reassessment completed.  Patient remains hemodynamically stable and lactic acid has improved substantially from greater than 8 to 3.3.  Heart rate just under 100.  Stable for emergent urological surgery. [CF]    Clinical Course User Index [CF] Hinda Kehr, MD     ____________________________________________  FINAL CLINICAL IMPRESSION(S) / ED DIAGNOSES  Final diagnoses:  Septic shock ([place])  Urinary tract infection without hematuria, site unspecified  Acute renal failure superimposed on chronic kidney disease, unspecified CKD stage, unspecified acute renal failure type ([place])  Dementia without behavioral disturbance, unspecified dementia type ([place])  Delirium  Ureterolithiasis  Hydronephrosis with ureteropelvic junction (UPJ) obstruction     MEDICATIONS GIVEN DURING THIS VISIT:  Medications  piperacillin-tazobactam (ZOSYN) IVPB 2.25 g (has no administration in time range)  docusate sodium (COLACE) capsule 100 mg (has no administration in time range)  polyethylene glycol (MIRALAX / GLYCOLAX) packet 17 g (has no administration in time range)  lactated ringers infusion (has no administration in time range)  acetaminophen (TYLENOL) tablet 650 mg (has no administration in time range)  ondansetron (ZOFRAN) injection 4 mg (has no administration in time range)  lactated ringers bolus 1,000 mL (0 mLs Intravenous Stopped 01/23/20 0139)  lactated ringers bolus 1,000 mL (0 mLs Intravenous Stopped 01/23/20 0236)  ceFEPIme (MAXIPIME) 2 g in sodium chloride 0.9 % 100 mL IVPB (0 g Intravenous Stopped 01/23/20 0217)     ED Discharge Orders    None      *Please note:  Aaleyah Allman was evaluated in Emergency Department on 01/23/2020 for the symptoms described in the history of present  illness. She was evaluated in the context of the global COVID-19 pandemic, which necessitated consideration that the patient might be at risk for infection with the SARS-CoV-2 virus that causes COVID-19. Institutional protocols and algorithms that pertain to the evaluation of patients at risk for COVID-19 are in a state of rapid change based on information released by regulatory bodies including the CDC and federal and state organizations.  These policies and algorithms were followed during the patient's care in the ED.  Some ED evaluations and interventions may be delayed as a result of limited staffing during and after the pandemic.*  Note:  This document was prepared using Dragon voice recognition software and may include unintentional dictation errors.   Hinda Kehr, MD 01/23/20 1975    Hinda Kehr, MD 01/23/20 0300

## 2020-01-23 ENCOUNTER — Inpatient Hospital Stay: Payer: Medicare Other | Admitting: Anesthesiology

## 2020-01-23 ENCOUNTER — Encounter
Admission: EM | Disposition: A | Discharge status: Hospice | Payer: Self-pay | Source: Home / Self Care | Attending: Internal Medicine

## 2020-01-23 ENCOUNTER — Encounter: Payer: Self-pay | Admitting: Internal Medicine

## 2020-01-23 ENCOUNTER — Emergency Department: Payer: Medicare Other

## 2020-01-23 ENCOUNTER — Inpatient Hospital Stay: Payer: Medicare Other

## 2020-01-23 DIAGNOSIS — Z66 Do not resuscitate: Secondary | ICD-10-CM | POA: Diagnosis present

## 2020-01-23 DIAGNOSIS — F0390 Unspecified dementia without behavioral disturbance: Secondary | ICD-10-CM | POA: Diagnosis not present

## 2020-01-23 DIAGNOSIS — R34 Anuria and oliguria: Secondary | ICD-10-CM | POA: Diagnosis present

## 2020-01-23 DIAGNOSIS — R7881 Bacteremia: Secondary | ICD-10-CM | POA: Diagnosis not present

## 2020-01-23 DIAGNOSIS — D72829 Elevated white blood cell count, unspecified: Secondary | ICD-10-CM | POA: Diagnosis not present

## 2020-01-23 DIAGNOSIS — A419 Sepsis, unspecified organism: Secondary | ICD-10-CM

## 2020-01-23 DIAGNOSIS — G9341 Metabolic encephalopathy: Secondary | ICD-10-CM | POA: Diagnosis present

## 2020-01-23 DIAGNOSIS — F0280 Dementia in other diseases classified elsewhere without behavioral disturbance: Secondary | ICD-10-CM | POA: Diagnosis present

## 2020-01-23 DIAGNOSIS — E785 Hyperlipidemia, unspecified: Secondary | ICD-10-CM | POA: Diagnosis present

## 2020-01-23 DIAGNOSIS — D696 Thrombocytopenia, unspecified: Secondary | ICD-10-CM | POA: Diagnosis not present

## 2020-01-23 DIAGNOSIS — I129 Hypertensive chronic kidney disease with stage 1 through stage 4 chronic kidney disease, or unspecified chronic kidney disease: Secondary | ICD-10-CM | POA: Diagnosis present

## 2020-01-23 DIAGNOSIS — R5381 Other malaise: Secondary | ICD-10-CM | POA: Diagnosis present

## 2020-01-23 DIAGNOSIS — E870 Hyperosmolality and hypernatremia: Secondary | ICD-10-CM | POA: Diagnosis present

## 2020-01-23 DIAGNOSIS — I480 Paroxysmal atrial fibrillation: Secondary | ICD-10-CM | POA: Diagnosis present

## 2020-01-23 DIAGNOSIS — R410 Disorientation, unspecified: Secondary | ICD-10-CM | POA: Diagnosis not present

## 2020-01-23 DIAGNOSIS — Z20822 Contact with and (suspected) exposure to covid-19: Secondary | ICD-10-CM | POA: Diagnosis present

## 2020-01-23 DIAGNOSIS — N201 Calculus of ureter: Secondary | ICD-10-CM | POA: Diagnosis present

## 2020-01-23 DIAGNOSIS — I495 Sick sinus syndrome: Secondary | ICD-10-CM | POA: Diagnosis present

## 2020-01-23 DIAGNOSIS — N390 Urinary tract infection, site not specified: Secondary | ICD-10-CM

## 2020-01-23 DIAGNOSIS — K5730 Diverticulosis of large intestine without perforation or abscess without bleeding: Secondary | ICD-10-CM | POA: Diagnosis present

## 2020-01-23 DIAGNOSIS — N136 Pyonephrosis: Secondary | ICD-10-CM | POA: Diagnosis present

## 2020-01-23 DIAGNOSIS — Z515 Encounter for palliative care: Secondary | ICD-10-CM | POA: Diagnosis not present

## 2020-01-23 DIAGNOSIS — N1330 Unspecified hydronephrosis: Secondary | ICD-10-CM

## 2020-01-23 DIAGNOSIS — I248 Other forms of acute ischemic heart disease: Secondary | ICD-10-CM | POA: Diagnosis present

## 2020-01-23 DIAGNOSIS — N171 Acute kidney failure with acute cortical necrosis: Secondary | ICD-10-CM | POA: Diagnosis not present

## 2020-01-23 DIAGNOSIS — B9620 Unspecified Escherichia coli [E. coli] as the cause of diseases classified elsewhere: Secondary | ICD-10-CM

## 2020-01-23 DIAGNOSIS — N184 Chronic kidney disease, stage 4 (severe): Secondary | ICD-10-CM | POA: Diagnosis present

## 2020-01-23 DIAGNOSIS — E871 Hypo-osmolality and hyponatremia: Secondary | ICD-10-CM | POA: Diagnosis not present

## 2020-01-23 DIAGNOSIS — R6520 Severe sepsis without septic shock: Secondary | ICD-10-CM | POA: Diagnosis not present

## 2020-01-23 DIAGNOSIS — R64 Cachexia: Secondary | ICD-10-CM | POA: Diagnosis present

## 2020-01-23 DIAGNOSIS — N202 Calculus of kidney with calculus of ureter: Secondary | ICD-10-CM | POA: Diagnosis present

## 2020-01-23 DIAGNOSIS — N200 Calculus of kidney: Secondary | ICD-10-CM | POA: Diagnosis not present

## 2020-01-23 DIAGNOSIS — G309 Alzheimer's disease, unspecified: Secondary | ICD-10-CM | POA: Diagnosis present

## 2020-01-23 DIAGNOSIS — A4151 Sepsis due to Escherichia coli [E. coli]: Secondary | ICD-10-CM | POA: Diagnosis not present

## 2020-01-23 DIAGNOSIS — A4189 Other specified sepsis: Secondary | ICD-10-CM | POA: Diagnosis present

## 2020-01-23 DIAGNOSIS — N179 Acute kidney failure, unspecified: Secondary | ICD-10-CM | POA: Diagnosis present

## 2020-01-23 DIAGNOSIS — R6521 Severe sepsis with septic shock: Secondary | ICD-10-CM | POA: Diagnosis present

## 2020-01-23 DIAGNOSIS — N132 Hydronephrosis with renal and ureteral calculous obstruction: Secondary | ICD-10-CM

## 2020-01-23 HISTORY — PX: CYSTOSCOPY WITH STENT PLACEMENT: SHX5790

## 2020-01-23 LAB — BLOOD CULTURE ID PANEL (REFLEXED) - BCID2

## 2020-01-23 LAB — TROPONIN I (HIGH SENSITIVITY)
Troponin I (High Sensitivity): 214 ng/L (ref <18)
Troponin I (High Sensitivity): 764 ng/L (ref <18)
Troponin I (High Sensitivity): 819 ng/L (ref <18)

## 2020-01-23 LAB — URINALYSIS, COMPLETE (UACMP) WITH MICROSCOPIC
Bacteria, UA: NONE SEEN
Bilirubin Urine: NEGATIVE
Glucose, UA: NEGATIVE mg/dL
Ketones, ur: NEGATIVE mg/dL
Nitrite: NEGATIVE
Protein, ur: 100 mg/dL — AB
Specific Gravity, Urine: 1.014 (ref 1.005–1.030)
WBC, UA: >50 WBC/hpf — ABNORMAL HIGH (ref 0–5)
pH: 5 (ref 5.0–8.0)

## 2020-01-23 LAB — GLUCOSE, CAPILLARY
Glucose-Capillary: 156 mg/dL — ABNORMAL HIGH (ref 70–99)
Glucose-Capillary: 96 mg/dL (ref 70–99)
Glucose-Capillary: 52 mg/dL — ABNORMAL LOW (ref 70–99)
Glucose-Capillary: 54 mg/dL — ABNORMAL LOW (ref 70–99)
Glucose-Capillary: 69 mg/dL — ABNORMAL LOW (ref 70–99)
Glucose-Capillary: 127 mg/dL — ABNORMAL HIGH (ref 70–99)
Glucose-Capillary: 114 mg/dL — ABNORMAL HIGH (ref 70–99)

## 2020-01-23 LAB — HEPATIC FUNCTION PANEL
ALT: 16 U/L (ref 0–44)
AST: 41 U/L (ref 15–41)
Albumin: 3.3 g/dL — ABNORMAL LOW (ref 3.5–5.0)
Alkaline Phosphatase: 68 U/L (ref 38–126)
Bilirubin, Direct: 0.3 mg/dL — ABNORMAL HIGH (ref 0.0–0.2)
Indirect Bilirubin: 0.6 mg/dL (ref 0.3–0.9)
Total Bilirubin: 0.9 mg/dL (ref 0.3–1.2)
Total Protein: 7.1 g/dL (ref 6.5–8.1)

## 2020-01-23 LAB — BASIC METABOLIC PANEL
Anion gap: 17 — ABNORMAL HIGH (ref 5–15)
BUN: 47 mg/dL — ABNORMAL HIGH (ref 8–23)
CO2: 19 mmol/L — ABNORMAL LOW (ref 22–32)
Calcium: 10.1 mg/dL (ref 8.9–10.3)
Chloride: 109 mmol/L (ref 98–111)
Creatinine, Ser: 3.82 mg/dL — ABNORMAL HIGH (ref 0.44–1.00)
GFR, Estimated: 11 mL/min — ABNORMAL LOW (ref >60)
Glucose, Bld: 122 mg/dL — ABNORMAL HIGH (ref 70–99)
Potassium: 4.1 mmol/L (ref 3.5–5.1)
Sodium: 145 mmol/L (ref 135–145)

## 2020-01-23 LAB — POC SARS CORONAVIRUS 2 AG -  ED: SARS Coronavirus 2 Ag: NEGATIVE

## 2020-01-23 LAB — MRSA PCR SCREENING: MRSA by PCR: NEGATIVE

## 2020-01-23 LAB — PROCALCITONIN: Procalcitonin: 80.51 ng/mL

## 2020-01-23 LAB — LACTIC ACID, PLASMA
Lactic Acid, Venous: 3.3 mmol/L (ref 0.5–1.9)
Lactic Acid, Venous: 7.5 mmol/L (ref 0.5–1.9)
Lactic Acid, Venous: 7.9 mmol/L (ref 0.5–1.9)
Lactic Acid, Venous: 8.1 mmol/L (ref 0.5–1.9)

## 2020-01-23 LAB — PROTIME-INR
INR: 2.1 — ABNORMAL HIGH (ref 0.8–1.2)
Prothrombin Time: 22.8 seconds — ABNORMAL HIGH (ref 11.4–15.2)

## 2020-01-23 LAB — SARS CORONAVIRUS 2 (TAT 6-24 HRS): SARS Coronavirus 2: NEGATIVE

## 2020-01-23 LAB — CBG MONITORING, ED: Glucose-Capillary: 69 mg/dL — ABNORMAL LOW (ref 70–99)

## 2020-01-23 LAB — APTT: aPTT: 34 seconds (ref 24–36)

## 2020-01-23 LAB — LIPASE, BLOOD: Lipase: 69 U/L — ABNORMAL HIGH (ref 11–51)

## 2020-01-23 SURGERY — CYSTOSCOPY, WITH STENT INSERTION
Anesthesia: General | Site: Ureter | Laterality: Left

## 2020-01-23 MED ORDER — WARFARIN SODIUM 3 MG PO TABS
3.0000 mg | ORAL_TABLET | Freq: Every day | ORAL | Status: DC
  Filled 2020-01-23: qty 1

## 2020-01-23 MED ORDER — WARFARIN - PHARMACIST DOSING INPATIENT: Freq: Every day | Status: DC

## 2020-01-23 MED ORDER — SODIUM CHLORIDE 0.9 % IV SOLN
INTRAVENOUS | Status: DC | PRN
  Administered 2020-01-23: 250 mL via INTRAVENOUS

## 2020-01-23 MED ORDER — LIDOCAINE HCL (CARDIAC) PF 100 MG/5ML IV SOSY
PREFILLED_SYRINGE | INTRAVENOUS | Status: DC | PRN
  Administered 2020-01-23: 60 mg via INTRAVENOUS

## 2020-01-23 MED ORDER — LACTATED RINGERS IV SOLN: INTRAVENOUS | Status: DC

## 2020-01-23 MED ORDER — LACTATED RINGERS IV BOLUS
1000.0000 mL | Freq: Once | INTRAVENOUS | Status: AC
  Administered 2020-01-23: 1000 mL via INTRAVENOUS

## 2020-01-23 MED ORDER — FENTANYL CITRATE (PF) 100 MCG/2ML IJ SOLN
INTRAMUSCULAR | Status: AC
  Filled 2020-01-23: qty 2

## 2020-01-23 MED ORDER — LIDOCAINE HCL URETHRAL/MUCOSAL 2 % EX GEL
CUTANEOUS | Status: DC | PRN
  Administered 2020-01-23: 1

## 2020-01-23 MED ORDER — HEPARIN SODIUM (PORCINE) 5000 UNIT/ML IJ SOLN
5000.0000 [IU] | Freq: Three times a day (TID) | INTRAMUSCULAR | Status: DC
  Administered 2020-01-23 – 2020-01-25 (×6): 5000 [IU] via SUBCUTANEOUS
  Filled 2020-01-23 (×5): qty 1

## 2020-01-23 MED ORDER — INSULIN ASPART 100 UNIT/ML ~~LOC~~ SOLN: 0.0000 [IU] | Freq: Three times a day (TID) | SUBCUTANEOUS | Status: DC

## 2020-01-23 MED ORDER — SODIUM CHLORIDE 0.9 % IV SOLN
2.0000 g | Freq: Once | INTRAVENOUS | Status: AC
  Administered 2020-01-23: 2 g via INTRAVENOUS
  Filled 2020-01-23: qty 2

## 2020-01-23 MED ORDER — DEXTROSE IN LACTATED RINGERS 5 % IV SOLN: INTRAVENOUS | Status: DC

## 2020-01-23 MED ORDER — IOHEXOL 180 MG/ML  SOLN
INTRAMUSCULAR | Status: DC | PRN
  Administered 2020-01-23: 6 mL

## 2020-01-23 MED ORDER — CHLORHEXIDINE GLUCONATE CLOTH 2 % EX PADS
6.0000 | MEDICATED_PAD | Freq: Every day | CUTANEOUS | Status: DC
  Administered 2020-01-23 – 2020-02-03 (×8): 6 via TOPICAL

## 2020-01-23 MED ORDER — DOCUSATE SODIUM 100 MG PO CAPS: 100.0000 mg | ORAL_CAPSULE | Freq: Two times a day (BID) | ORAL | Status: DC | PRN

## 2020-01-23 MED ORDER — POLYETHYLENE GLYCOL 3350 17 G PO PACK: 17.0000 g | PACK | Freq: Every day | ORAL | Status: DC | PRN

## 2020-01-23 MED ORDER — ACETAMINOPHEN 325 MG PO TABS
650.0000 mg | ORAL_TABLET | ORAL | Status: DC | PRN
  Administered 2020-01-25: 650 mg via ORAL
  Filled 2020-01-23: qty 2

## 2020-01-23 MED ORDER — SODIUM CHLORIDE 0.9 % IV SOLN
2.0000 g | INTRAVENOUS | Status: DC
  Administered 2020-01-23 – 2020-01-24 (×2): 2 g via INTRAVENOUS
  Filled 2020-01-23 (×2): qty 2
  Filled 2020-01-23: qty 20

## 2020-01-23 MED ORDER — LIDOCAINE HCL URETHRAL/MUCOSAL 2 % EX GEL
CUTANEOUS | Status: AC
  Filled 2020-01-23: qty 10

## 2020-01-23 MED ORDER — PROPOFOL 10 MG/ML IV BOLUS
INTRAVENOUS | Status: AC
  Filled 2020-01-23: qty 20

## 2020-01-23 MED ORDER — DEXTROSE 50 % IV SOLN
12.5000 g | INTRAVENOUS | Status: AC
  Administered 2020-01-23: 12.5 g via INTRAVENOUS

## 2020-01-23 MED ORDER — ONDANSETRON HCL 4 MG/2ML IJ SOLN: 4.0000 mg | Freq: Four times a day (QID) | INTRAMUSCULAR | Status: DC | PRN

## 2020-01-23 MED ORDER — WARFARIN SODIUM 3 MG PO TABS: 3.0000 mg | ORAL_TABLET | Freq: Every day | ORAL | Status: DC

## 2020-01-23 MED ORDER — HEPARIN SODIUM (PORCINE) 5000 UNIT/ML IJ SOLN: 5000.0000 [IU] | Freq: Three times a day (TID) | INTRAMUSCULAR | Status: DC

## 2020-01-23 MED ORDER — PIPERACILLIN-TAZOBACTAM IN DEX 2-0.25 GM/50ML IV SOLN
2.2500 g | Freq: Three times a day (TID) | INTRAVENOUS | Status: DC
  Administered 2020-01-23: 2.25 g via INTRAVENOUS
  Filled 2020-01-23 (×4): qty 50

## 2020-01-23 MED ORDER — FENTANYL CITRATE (PF) 100 MCG/2ML IJ SOLN
INTRAMUSCULAR | Status: DC | PRN
  Administered 2020-01-23 (×5): 25 ug via INTRAVENOUS

## 2020-01-23 MED ORDER — LACTATED RINGERS IV BOLUS (SEPSIS)
1000.0000 mL | Freq: Once | INTRAVENOUS | Status: AC
  Administered 2020-01-23: 1000 mL via INTRAVENOUS

## 2020-01-23 MED ORDER — DEXTROSE 50 % IV SOLN
12.5000 g | Freq: Once | INTRAVENOUS | Status: AC
  Administered 2020-01-23: 17:00:00 12.5 g via INTRAVENOUS
  Filled 2020-01-23: qty 50

## 2020-01-23 SURGICAL SUPPLY — 19 items
BAG DRAIN CYSTO-URO LG1000N (MISCELLANEOUS) ×2 IMPLANT
BRUSH SCRUB EZ 1% IODOPHOR (MISCELLANEOUS) ×2 IMPLANT
CATH URETL 5X70 OPEN END (CATHETERS) ×2 IMPLANT
GLIDEWIRE STR 0.035 150CM 3CM (WIRE) ×2 IMPLANT
GLOVE BIOGEL PI IND STRL 7.5 (GLOVE) ×1 IMPLANT
GLOVE BIOGEL PI INDICATOR 7.5 (GLOVE) ×1
GOWN STRL REUS W/ TWL XL LVL3 (GOWN DISPOSABLE) ×1 IMPLANT
GOWN STRL REUS W/TWL XL LVL3 (GOWN DISPOSABLE) ×1
GUIDEWIRE STR DUAL SENSOR (WIRE) ×2 IMPLANT
KIT TURNOVER CYSTO (KITS) ×2 IMPLANT
MANIFOLD NEPTUNE II (INSTRUMENTS) ×2 IMPLANT
PACK CYSTO AR (MISCELLANEOUS) ×2 IMPLANT
SET CYSTO W/LG BORE CLAMP LF (SET/KITS/TRAYS/PACK) ×2 IMPLANT
SOL .9 NS 3000ML IRR  AL (IV SOLUTION) ×1
SOL .9 NS 3000ML IRR UROMATIC (IV SOLUTION) ×1 IMPLANT
STENT URET 6FRX24 CONTOUR (STENTS) IMPLANT
STENT URET 6FRX26 CONTOUR (STENTS) IMPLANT
SURGILUBE 2OZ TUBE FLIPTOP (MISCELLANEOUS) ×2 IMPLANT
WATER STERILE IRR 1000ML POUR (IV SOLUTION) ×2 IMPLANT

## 2020-01-23 NOTE — Sepsis Progress Note (Signed)
Sepsis monitoring complete 

## 2020-01-23 NOTE — Anesthesia Postprocedure Evaluation (Signed)
Anesthesia Post Note  Patient: Hailey Lopez  Procedure(s) Performed: CYSTOSCOPY WITH STENT PLACEMENT (Left Ureter)  Patient location during evaluation: SICU Anesthesia Type: General Level of consciousness: awake and alert Pain management: pain level controlled Vital Signs Assessment: post-procedure vital signs reviewed and stable Respiratory status: spontaneous breathing Cardiovascular status: stable Postop Assessment: no apparent nausea or vomiting Anesthetic complications: no   No complications documented.   Last Vitals:  Vitals:   01/23/20 0619 01/23/20 0700  BP: (!) 110/50 (!) 111/54  Pulse: 91 78  Resp: 14 (!) 24  Temp: 37.4 C   SpO2: 98% 100%    Last Pain:  Vitals:   01/23/20 8315  TempSrc: Oral                 Taci Sterling B Clarisa Kindred

## 2020-01-23 NOTE — Progress Notes (Addendum)
[place] for warfarin Indication: atrial fibrillation  No Known Allergies  Patient Measurements: Height: 5\' 4"  (162.6 cm) Weight: 63.5 kg (139 lb 15.9 oz) IBW/kg (Calculated) : 54.7  Vital Signs: Temp: 97 F (36.1 C) (01/05 0800) Temp Source: Oral (01/05 0800) BP: 109/60 (01/05 1000) Pulse Rate: 89 (01/05 1000)  Labs: Recent Labs    01/22/20 2059 01/22/20 2330 01/23/20 0707 01/23/20 0931  HGB 12.9  --   --   --   HCT 39.5  --   --   --   PLT 185  --   --   --   APTT 34  --   --   --   LABPROT 22.8*  --   --   --   INR 2.1*  --   --   --   CREATININE 3.48*  --   --  3.82*  TROPONINIHS 61* 214* 764* 819*    Estimated Creatinine Clearance: 8.8 mL/min (A) (by C-G formula based on SCr of 3.82 mg/dL (H)).   Medical History: Past Medical History:  Diagnosis Date  . Alzheimer's disease ([place])   . Atrial fibrillation ([place])   . Chronic kidney disease    Stage 4  . CVA (cerebrovascular accident) ([place])   . DVT (deep venous thrombosis) ([place]) 2013   LLE  . Hyperlipidemia   . Hypertension   . Pacemaker   . Pulmonary embolism [facility])      Assessment: 87 year old female presented with vomiting and diarrhea. Found to have left proximal ureteral calculus with obstruction, now s/p left ureteral stent placement. Blood cultures with 4/4 bottles E.coli, likely urinary source. Patient with h/o afib on warfarin PTA. Last known home dose warfarin 3 mg daily. INR therapeutic on admission.  Date INR Dose 1/4 2.1  1/5 -- 3 mg  Goal of Therapy:  INR 2-3 Monitor platelets by anticoagulation protocol: Yes   Plan:  INR therapeutic on admission. Patient s/p urological procedure. Will continue with home dose of warfarin 3 mg daily. INR with morning labs.  UPDATE 1500: Patient unable to take PO at this time. Discussed with provider. Will do heparin 5000 units subcu q8h for now given procedure. If patient still unable to take PO tomorrow, may need  to transition to heparin drip.  Tawnya Crook, PharmD 01/23/2020,11:36 AM

## 2020-01-23 NOTE — Anesthesia Preprocedure Evaluation (Signed)
Anesthesia Evaluation  Patient identified by MRN, date of birth, ID band Patient awake    Reviewed: Allergy & Precautions, H&P , NPO status , Patient's Chart, lab work & pertinent test results  History of Anesthesia Complications Negative for: history of anesthetic complications  Airway   TM Distance: <3 FB    Comment: Unable to comply with Mallampati exam Dental  (+) Teeth Intact   Pulmonary neg sleep apnea, neg COPD, former smoker,  H/o PE   breath sounds clear to auscultation       Cardiovascular hypertension, (-) angina(-) Past MI and (-) Cardiac Stents + dysrhythmias Atrial Fibrillation + pacemaker  Rhythm:irregular Rate:Normal  Elevated troponins   Neuro/Psych PSYCHIATRIC DISORDERS Dementia Does not speak much at baseline, says a few thingsnegative neurological ROS  negative psych ROS   GI/Hepatic negative GI ROS, Neg liver ROS,   Endo/Other  negative endocrine ROS  Renal/GU CRFRenal disease     Musculoskeletal   Abdominal   Peds  Hematology Anticoagulated on Coumadin   Anesthesia Other Findings Past Medical History: No date: Alzheimer's disease (H[facility]) No date: Atrial fibrillation (H[facility]) No date: Chronic kidney disease     Comment:  Stage 4 No date: CVA (cerebrovascular accident) ([place]) 2013: DVT (deep venous thrombosis) ([place])     Comment:  LLE No date: Hyperlipidemia No date: Hypertension No date: Pacemaker No date: Pulmonary embolism ([place])  Past Surgical History: No date: CATARACT EXTRACTION, BILATERAL 2013: PACEMAKER IMPLANT  BMI    Body Mass Index: 24.03 kg/m      Reproductive/Obstetrics negative OB ROS                             Anesthesia Physical Anesthesia Plan  ASA: III and emergent  Anesthesia Plan: MAC   Post-op Pain Management:    Induction:   PONV Risk Score and Plan:   Airway Management Planned:   Additional Equipment:   Intra-op Plan:    Post-operative Plan:   Informed Consent: I have reviewed the patients History and Physical, chart, labs and discussed the procedure including the risks, benefits and alternatives for the proposed anesthesia with the patient or authorized representative who has indicated his/her understanding and acceptance.       Plan Discussed with: Anesthesiologist, CRNA and Surgeon  Anesthesia Plan Comments: (Local with sedation)        Anesthesia Quick Evaluation

## 2020-01-23 NOTE — Op Note (Incomplete)
Preoperative diagnosis:  1. Left proximal ureteral calculus with obstruction 2. Sepsis secondary to above  Postoperative diagnosis:  1. Same  Procedure:  1. Cystoscopy 2. Left ureteral stent placement (6FR/24 cm 3. Left retrograde pyelography with interpretation   Surgeon: Nicki Reaper C. Vearl Allbaugh, M.D.  Anesthesia: MAC  Complications: None  Intraoperative findings:  1.  Left retrograde pyelogram-moderate left hydronephrosis with proximal ureteral calculus and filling defect lower calyx infundibulum consistent with partial staghorn seen on CT  EBL: Minimal  Specimens: Urine left renal pelvis for culture/Gram stain  Indication: Dalynn Jhaveri is a 87 y.o. female with Alzheimer's dementia brought to the ED by her niece with acute mental status change associated with multiple episodes of vomiting, weakness and syncope.  CT showed an 8 x 15 mm left proximal ureteral calculus with mild to moderate hydronephrosis and perinephric stranding.  There was also a nonobstructing lower pole partial staghorn.  Management options were discussed with her niece and urgent stent placement was recommended.  We have discussed the potential benefits and risks of the procedure, side effects of the proposed treatment, the likelihood of the patient achieving the goals of the procedure, and any potential problems that might occur during the procedure or recuperation. Informed consent has been obtained.  Description of procedure:  The patient was taken to the operating room and placed on the table in the supine position.  IV sedation was obtained by anesthesia.  The patient was placed in the dorsal lithotomy position, prepped and draped in the usual sterile fashion, and preoperative antibiotics were administered. A preoperative time-out was performed.   A 21 French cystoscope sheath with obturator was lubricated and passed per urethra.  Panendoscopy was performed.  The urine was murky and malodorous.  The bladder was  filled and drained x2 to improve visualization.  The bladder mucosa showed no solid or papillary lesions.  There was mild patchy erythema.  The ureteral orifice ease were normal-appearing bilaterally.  A 0.038 sensor wire was placed through a 5 [place] open-ended ureteral catheter positioned at the left UO.  Resistance was met approximately 1 cm in.  A Glidewire was then attempted and was able to negotiate past this area and advanced proximally past the calculus seen on fluoroscopy.  The 5 [place] open-ended ureteral catheter was then advanced over the wire to the region of the renal pelvis and approximately 5 cc of cloudy urine was aspirated and sent for culture/Gram stain.  Retrograde pyelogram was performed through the catheter with findings as described above.  The sensor wire was placed in the ureteral catheter in the ureteral catheter was removed.  A 6FR/24 cm Contour ureteral stent was then advanced over the wire.  The proximal curl was in an upper pole calyx and the distal end was well positioned in the bladder.  A 16 French Foley catheter was placed to achieve maximal drainage.  She was transported to the ICU in guarded condition.   John Giovanni, MD

## 2020-01-23 NOTE — ED Notes (Signed)
Pt to ct 

## 2020-01-23 NOTE — Progress Notes (Signed)
GOALS OF CARE DISCUSSION  The Clinical status was relayed to family in detail. Niece Ms [place] Bells at bedside  Updated and notified of patients medical condition.  Explained to family course of therapy and the modalities     Family understands the situation.  They have consented and agreed to DNR/DNI   Family are satisfied with Plan of action and management. All questions answered     Corrin Parker, M.D.  [facility]  Medical Director [place] Director [facility] Cardio-Pulmonary Department

## 2020-01-23 NOTE — Progress Notes (Signed)
[place] Progress Note Patient Name: Hailey Lopez DOB: 1929/05/21 MRN: [id number]   Date of Service  01/23/2020  HPI/Events of Note  Patient admitted to the ICU post-op left ureteral stent placement for obstructive uropathy with septic shock of urinary tract origin.  eICU Interventions  New Patient Evaluation completed.        Kerry Kass Chucky Homes 01/23/2020, 6:09 AM

## 2020-01-23 NOTE — Transfer of Care (Signed)
Immediate Anesthesia Transfer of Care Note  Patient: Hailey Lopez  Procedure(s) Performed: CYSTOSCOPY WITH STENT PLACEMENT (Left Ureter)  Patient Location: PACU  Anesthesia Type:MAC  Level of Consciousness: awake  Airway & Oxygen Therapy: Patient Spontanous Breathing and Patient connected to nasal cannula oxygen  Post-op Assessment: Report given to RN and Post -op Vital signs reviewed and stable  Post vital signs: Reviewed and stable  Last Vitals:  Vitals Value Taken Time  BP 103/59 01/23/20 0430  Temp 37.2 C 01/23/20 0431  Pulse 95 01/23/20 0431  Resp 18 01/23/20 0437  SpO2 99 % 01/23/20 0431  Vitals shown include unvalidated device data.  Last Pain: There were no vitals filed for this visit.       Complications: No complications documented.

## 2020-01-23 NOTE — Progress Notes (Signed)
PHARMACY - PHYSICIAN COMMUNICATION CRITICAL VALUE ALERT - BLOOD CULTURE IDENTIFICATION (BCID)  Hailey Lopez is an 87 y.o. female who presented to [facility] on 01/22/2020 with a chief complaint of vomiting and diarrhea. Found to have left proximal ureteral calculus with obstruction, now s/p left ureteral stent placement.  Assessment:  4/4 bottles E.coli (likely urinary source)  Name of physician (or Provider) Contacted: Dr. Mortimer Fries  Current antibiotics: Zosyn  Changes to prescribed antibiotics recommended: Rocephin  Results for orders placed or performed during the hospital encounter of 01/22/20  Blood Culture ID Panel (Reflexed) (Collected: 01/23/2020 12:13 AM)  Result Value Ref Range   Enterococcus faecalis NOT DETECTED NOT DETECTED   Enterococcus Faecium NOT DETECTED NOT DETECTED   Listeria monocytogenes NOT DETECTED NOT DETECTED   Staphylococcus species NOT DETECTED NOT DETECTED   Staphylococcus aureus (BCID) NOT DETECTED NOT DETECTED   Staphylococcus epidermidis NOT DETECTED NOT DETECTED   Staphylococcus lugdunensis NOT DETECTED NOT DETECTED   Streptococcus species NOT DETECTED NOT DETECTED   Streptococcus agalactiae NOT DETECTED NOT DETECTED   Streptococcus pneumoniae NOT DETECTED NOT DETECTED   Streptococcus pyogenes NOT DETECTED NOT DETECTED   A.calcoaceticus-baumannii NOT DETECTED NOT DETECTED   Bacteroides fragilis NOT DETECTED NOT DETECTED   Enterobacterales DETECTED (A) NOT DETECTED   Enterobacter cloacae complex NOT DETECTED NOT DETECTED   Escherichia coli DETECTED (A) NOT DETECTED   Klebsiella aerogenes NOT DETECTED NOT DETECTED   Klebsiella oxytoca NOT DETECTED NOT DETECTED   Klebsiella pneumoniae NOT DETECTED NOT DETECTED   Proteus species NOT DETECTED NOT DETECTED   Salmonella species NOT DETECTED NOT DETECTED   Serratia marcescens NOT DETECTED NOT DETECTED   Haemophilus influenzae NOT DETECTED NOT DETECTED   Neisseria meningitidis NOT DETECTED NOT DETECTED    Pseudomonas aeruginosa NOT DETECTED NOT DETECTED   Stenotrophomonas maltophilia NOT DETECTED NOT DETECTED   Candida albicans NOT DETECTED NOT DETECTED   Candida auris NOT DETECTED NOT DETECTED   Candida glabrata NOT DETECTED NOT DETECTED   Candida krusei NOT DETECTED NOT DETECTED   Candida parapsilosis NOT DETECTED NOT DETECTED   Candida tropicalis NOT DETECTED NOT DETECTED   Cryptococcus neoformans/gattii NOT DETECTED NOT DETECTED   CTX-M ESBL NOT DETECTED NOT DETECTED   Carbapenem resistance IMP NOT DETECTED NOT DETECTED   Carbapenem resistance KPC NOT DETECTED NOT DETECTED   Carbapenem resistance NDM NOT DETECTED NOT DETECTED   Carbapenem resist OXA 48 LIKE NOT DETECTED NOT DETECTED   Carbapenem resistance VIM NOT DETECTED NOT DETECTED    Tawnya Crook, PharmD 01/23/2020  11:34 AM

## 2020-01-23 NOTE — Consult Note (Signed)
[facility]  CONSULT NOTE    Date: 01/23/2020                  Patient Name:  Hailey Lopez  MRN: [id number]  DOB: [birth year]  Age / Sex: 87 y.o., female         PCP: Marda Stalker, PA-C                 Service Requesting Consult: Dr. Mortimer Fries                 Reason for Consult: Acute kidney  injury            History of Present Illness: Hailey Lopez admitted to [facility] with sepsis from urinary tract infection. She was found to have an obstructive ureteral stone on the left and underwent emergent cystoscopy.  Nephrology consulted for worsening renal function.  Patient is nonverbal and unable to give a history. She was started on IV fluids.    Medications: Outpatient medications: Medications Prior to Admission  Medication Sig Dispense Refill Last Dose  . atorvastatin (LIPITOR) 10 MG tablet Take 10 mg by mouth daily.     . calcitRIOL (ROCALTROL) 0.5 MCG capsule Take 0.5 mcg by mouth at bedtime.      . donepezil (ARICEPT) 5 MG tablet Take 5 mg by mouth at bedtime.     . isosorbide dinitrate (ISORDIL) 5 MG tablet Take 5 mg by mouth daily.      . polyethylene glycol (MIRALAX / GLYCOLAX) packet Take 17 g by mouth daily as needed for mild constipation or moderate constipation.      Marland Kitchen warfarin (COUMADIN) 3 MG tablet Take 1 tablet by mouth once daily 90 tablet 1     Current medications: Current Facility-Administered Medications  Medication Dose Route Frequency Provider Last Rate Last Admin  . acetaminophen (TYLENOL) tablet 650 mg  650 mg Oral Q4H PRN Darel Hong D, NP      . cefTRIAXone (ROCEPHIN) 2 g in sodium chloride 0.9 % 100 mL IVPB  2 g Intravenous Q24H Flora Lipps, MD      . Chlorhexidine Gluconate Cloth 2 % PADS 6 each  6 each Topical Q0600 Bradly Bienenstock, NP   6 each at 01/23/20 0636  . docusate sodium (COLACE) capsule 100 mg  100 mg Oral BID PRN Bradly Bienenstock, NP      . lactated ringers infusion   Intravenous Continuous Bradly Bienenstock, NP   Stopped at 01/23/20 1114  . ondansetron (ZOFRAN) injection 4 mg  4 mg Intravenous Q6H PRN Darel Hong D, NP      . polyethylene glycol (MIRALAX / GLYCOLAX) packet 17 g  17 g Oral Daily PRN Darel Hong D, NP      . warfarin (COUMADIN) tablet 3 mg  3 mg Oral q1600 Flora Lipps, MD      . Warfarin - Pharmacist Dosing Inpatient   Does not apply [id number] Flora Lipps, MD          Allergies: No Known Allergies    Past Medical History: Past Medical History:  Diagnosis Date  . Alzheimer's disease ([place])   . Atrial fibrillation ([place])   . Chronic kidney disease    Stage 4  . CVA (cerebrovascular accident) ([place])   . DVT (deep venous thrombosis) ([place]) 2013   LLE  . Hyperlipidemia   . Hypertension   . Pacemaker   . Pulmonary embolism ([facility])      Past  Surgical History: Past Surgical History:  Procedure Laterality Date  . CATARACT EXTRACTION, BILATERAL    . PACEMAKER IMPLANT  2013     Family History: Family History  Problem Relation Age of Onset  . Dementia Mother   . Heart attack Father   . Stroke Father   . Hypertension Father   . Heart attack Sister   . Heart attack Brother   . Heart attack Brother   . Stroke Brother   . Hypertension Brother   . Diabetes Brother   . Hyperlipidemia Sister   . Dementia Sister      Social History: Social History   Socioeconomic History  . Marital status: Widowed    Spouse name: Not on file  . Number of children: 1  . Years of education: Not on file  . Highest education level: Associate degree: academic program  Occupational History    Comment: retired  Tobacco Use  . Smoking status: Former Research scientist (life sciences)  . Smokeless tobacco: Never Used  Substance and Sexual Activity  . Alcohol use: Not Currently  . Drug use: Never  . Sexual activity: Not on file  Other Topics Concern  . Not on file  Social History Narrative   10/24/18 lives with Ship broker, [name], from [place], husband passed in 208, only child (son) passed  67   Social Determinants of Health   Financial Resource Strain: Not on file  Food Insecurity: Not on file  Transportation Needs: Not on file  Physical Activity: Not on file  Stress: Not on file  Social Connections: Not on file  Intimate Partner Violence: Not on file     Review of Systems: Review of Systems  Unable to perform ROS: Dementia    Vital Signs: Blood pressure 106/62, pulse 91, temperature 99.4 F (37.4 C), resp. rate 20, height 5\' 4"  (1.626 m), weight 63.5 kg, SpO2 96 %.  Weight trends: Filed Weights   01/22/20 2056 01/23/20 0618 01/23/20 0619  Weight: 63.5 kg 63.5 kg 63.5 kg    Physical Exam: General: NAD, laying in bed  Head: Normocephalic, atraumatic. Moist oral mucosal membranes  Eyes: Anicteric, PERRL  Neck: Supple, trachea midline  Lungs:  Clear to auscultation  Heart: Regular rate and rhythm  Abdomen:  Soft, nontender,   Extremities: no peripheral edema.  Neurologic: Nonverbal   Skin: No lesions  GU: Foley with urine     Lab results: Basic Metabolic Panel: Recent Labs  Lab 01/22/20 2059 01/23/20 0931  NA 146* 145  K 3.5 4.1  CL 110 109  CO2 20* 19*  GLUCOSE 183* 122*  BUN 44* 47*  CREATININE 3.48* 3.82*  CALCIUM 10.3 10.1    Liver Function Tests: Recent Labs  Lab 01/22/20 2330  AST 41  ALT 16  ALKPHOS 68  BILITOT 0.9  PROT 7.1  ALBUMIN 3.3*   Recent Labs  Lab 01/22/20 2330  LIPASE 69*   No results for input(s): AMMONIA in the last 168 hours.  CBC: Recent Labs  Lab 01/22/20 2059  WBC 11.7*  HGB 12.9  HCT 39.5  MCV 94.5  PLT 185    Cardiac Enzymes: No results for input(s): CKTOTAL, CKMB, CKMBINDEX, TROPONINI in the last 168 hours.  BNP: Invalid input(s): POCBNP  CBG: Recent Labs  Lab 01/23/20 0522 01/23/20 0640 01/23/20 0811  GLUCAP 69* 156* 45    Microbiology: Results for orders placed or performed during the hospital encounter of 01/22/20  Blood Culture (routine x 2)     Status: None  (Preliminary  result)   Collection Time: 01/23/20 12:13 AM   Specimen: BLOOD  Result Value Ref Range Status   Specimen Description BLOOD LEFT ANTECUBITAL  Final   Special Requests   Final    BOTTLES DRAWN AEROBIC AND ANAEROBIC Blood Culture results may not be optimal due to an inadequate volume of blood received in culture bottles   Culture  Setup Time   Final    GRAM NEGATIVE RODS IN BOTH AEROBIC AND ANAEROBIC BOTTLES CRITICAL VALUE NOTED.  VALUE IS CONSISTENT WITH PREVIOUSLY REPORTED AND CALLED VALUE. Performed at [facility], [address] [place], [place] [address]    Culture GRAM NEGATIVE RODS  Final   Report Status PENDING  Incomplete  Blood Culture (routine x 2)     Status: None (Preliminary result)   Collection Time: 01/23/20 12:13 AM   Specimen: BLOOD  Result Value Ref Range Status   Specimen Description BLOOD BLOOD RIGHT FOREARM  Final   Special Requests   Final    BOTTLES DRAWN AEROBIC AND ANAEROBIC Blood Culture results may not be optimal due to an inadequate volume of blood received in culture bottles   Culture  Setup Time   Final    GRAM NEGATIVE RODS IN BOTH AEROBIC AND ANAEROBIC BOTTLES Organism ID to follow CRITICAL RESULT CALLED TO, READ BACK BY AND VERIFIED WITH: M.SLAUGHTER,PHARMD AT 1012 ON 01/23/20 BY GM Performed at [facility], [address] [place], [place] [address]    Culture GRAM NEGATIVE RODS  Final   Report Status PENDING  Incomplete  SARS CORONAVIRUS 2 (TAT 6-24 HRS) Nasopharyngeal Nasopharyngeal Swab     Status: None   Collection Time: 01/23/20 12:13 AM   Specimen: Nasopharyngeal Swab  Result Value Ref Range Status   SARS Coronavirus 2 NEGATIVE NEGATIVE Final    Comment: (NOTE) SARS-CoV-2 target nucleic acids are NOT DETECTED.  The SARS-CoV-2 RNA is generally detectable in upper and lower respiratory specimens during the acute phase of infection. Negative results do not preclude SARS-CoV-2 infection, do not rule  out co-infections with other pathogens, and should not be used as the sole basis for treatment or other patient management decisions. Negative results must be combined with clinical observations, patient history, and epidemiological information. The expected result is Negative.  Fact Sheet for Patients: SugarRoll.be  Fact Sheet for Healthcare Providers: https://www.woods-mathews.com/  This test is not yet approved or cleared by the [place] FDA and  has been authorized for detection and/or diagnosis of SARS-CoV-2 by FDA under an Emergency Use Authorization (EUA). This EUA will remain  in effect (meaning this test can be used) for the duration of the COVID-19 declaration under Se ction 564(b)(1) of the Act, 21 U.S.C. section 360bbb-3(b)(1), unless the authorization is terminated or revoked sooner.  Performed at [facility], [place] [address] [place], [place] [address]   Blood Culture ID Panel (Reflexed)     Status: Abnormal   Collection Time: 01/23/20 12:13 AM  Result Value Ref Range Status   Enterococcus faecalis NOT DETECTED NOT DETECTED Final   Enterococcus Faecium NOT DETECTED NOT DETECTED Final   Listeria monocytogenes NOT DETECTED NOT DETECTED Final   Staphylococcus species NOT DETECTED NOT DETECTED Final   Staphylococcus aureus (BCID) NOT DETECTED NOT DETECTED Final   Staphylococcus epidermidis NOT DETECTED NOT DETECTED Final   Staphylococcus lugdunensis NOT DETECTED NOT DETECTED Final   Streptococcus species NOT DETECTED NOT DETECTED Final   Streptococcus agalactiae NOT DETECTED NOT DETECTED Final   Streptococcus pneumoniae  NOT DETECTED NOT DETECTED Final   Streptococcus pyogenes NOT DETECTED NOT DETECTED Final   A.calcoaceticus-baumannii NOT DETECTED NOT DETECTED Final   Bacteroides fragilis NOT DETECTED NOT DETECTED Final   Enterobacterales DETECTED (A) NOT DETECTED Final    Comment: Enterobacterales represent a large  order of gram negative bacteria, not a single organism. CRITICAL RESULT CALLED TO, READ BACK BY AND VERIFIED WITH: M.SLAUGHTER,PHARMD AT 1012 ON 01/23/20 BY GM    Enterobacter cloacae complex NOT DETECTED NOT DETECTED Final   Escherichia coli DETECTED (A) NOT DETECTED Final    Comment: CRITICAL RESULT CALLED TO, READ BACK BY AND VERIFIED WITH: M.SLAUGHTER,PHARMD AT 1012 ON 01/23/20 BY GM    Klebsiella aerogenes NOT DETECTED NOT DETECTED Final   Klebsiella oxytoca NOT DETECTED NOT DETECTED Final   Klebsiella pneumoniae NOT DETECTED NOT DETECTED Final   Proteus species NOT DETECTED NOT DETECTED Final   Salmonella species NOT DETECTED NOT DETECTED Final   Serratia marcescens NOT DETECTED NOT DETECTED Final   Haemophilus influenzae NOT DETECTED NOT DETECTED Final   Neisseria meningitidis NOT DETECTED NOT DETECTED Final   Pseudomonas aeruginosa NOT DETECTED NOT DETECTED Final   Stenotrophomonas maltophilia NOT DETECTED NOT DETECTED Final   Candida albicans NOT DETECTED NOT DETECTED Final   Candida auris NOT DETECTED NOT DETECTED Final   Candida glabrata NOT DETECTED NOT DETECTED Final   Candida krusei NOT DETECTED NOT DETECTED Final   Candida parapsilosis NOT DETECTED NOT DETECTED Final   Candida tropicalis NOT DETECTED NOT DETECTED Final   Cryptococcus neoformans/gattii NOT DETECTED NOT DETECTED Final   CTX-M ESBL NOT DETECTED NOT DETECTED Final   Carbapenem resistance IMP NOT DETECTED NOT DETECTED Final   Carbapenem resistance KPC NOT DETECTED NOT DETECTED Final   Carbapenem resistance NDM NOT DETECTED NOT DETECTED Final   Carbapenem resist OXA 48 LIKE NOT DETECTED NOT DETECTED Final   Carbapenem resistance VIM NOT DETECTED NOT DETECTED Final    Comment: Performed at [facility], [address] [place], [place] [address]  MRSA PCR Screening     Status: None   Collection Time: 01/23/20  5:23 AM   Specimen: Nasopharyngeal  Result Value Ref Range Status   MRSA by PCR NEGATIVE  NEGATIVE Final    Comment:        The GeneXpert MRSA Assay (FDA approved for NASAL specimens only), is one component of a comprehensive MRSA colonization surveillance program. It is not intended to diagnose MRSA infection nor to guide or monitor treatment for MRSA infections. Performed at [facility], [address] [place], [place] [address]     Coagulation Studies: Recent Labs    01/22/20 Apr 09, 2057  LABPROT 22.8*  INR 2.1*    Urinalysis: Recent Labs    01/23/20 0013  COLORURINE YELLOW*  LABSPEC 1.014  PHURINE 5.0  GLUCOSEU NEGATIVE  HGBUR MODERATE*  BILIRUBINUR NEGATIVE  KETONESUR NEGATIVE  PROTEINUR 100*  NITRITE NEGATIVE  LEUKOCYTESUR MODERATE*      Imaging: CT ABDOMEN PELVIS WO CONTRAST  Result Date: 01/23/2020 CLINICAL DATA:  Bowel obstruction, vomiting, diarrhea EXAM: CT ABDOMEN AND PELVIS WITHOUT CONTRAST TECHNIQUE: Multidetector CT imaging of the abdomen and pelvis was performed following the standard protocol without IV contrast. COMPARISON:  10/07/2017 FINDINGS: Lower chest: There is left-sided volume loss again identified with mediastinal shift to the left. Mild global cardiomegaly. Pacemaker leads are seen within the right atrium and within the right ventricle embedded within the intraventricular septum. No pericardial effusion. Hepatobiliary: No focal liver abnormality is seen.  No gallstones, gallbladder wall thickening, or biliary dilatation. Pancreas: Unremarkable Spleen: Unremarkable Adrenals/Urinary Tract: There has developed mild left hydronephrosis and perinephric stranding secondary to an obstructing 8 mm x 15 mm x 15 mm calculus within the left ureteropelvic junction. An additional 10 mm staghorn calculus is seen within the terminal lower pole calyx. Stable exophytic simple cortical cyst noted within the left kidney. The right kidney is unremarkable; no intrarenal or ureteral calculi are noted on the right and there is no hydronephrosis on the  right. The bladder is unremarkable. Stomach/Bowel: Moderate sigmoid diverticulosis. Mild diverticulosis of the cecum also noted. The stomach, small bowel, and large bowel are otherwise unremarkable. No evidence of obstruction or focal inflammation. The appendix is absent. Vascular/Lymphatic: Extensive aortoiliac atherosclerotic calcification. Extensive calcification noted within the mid and distal superior mesenteric artery. No aortic aneurysm. Inferior vena cava filter in expected position within the infrarenal cava. No pathologic adenopathy within the abdomen and pelvis. Reproductive: Large involuted densely calcified fibroid noted within the uterine fundus. The endometrium is thickened, abnormal in a patient of this age in absence of hormonal stimulation. No adnexal masses are seen. Other: Small fat containing umbilical hernia. Musculoskeletal: Degenerative changes are seen within the lumbar spine. No lytic or blastic bone lesion is seen. IMPRESSION: Interval development of a mild left hydronephrosis and perinephric stranding secondary to an obstructing 15 mm calculus within the left ureteropelvic junction. Superimposed nonobstructing lower pole nephrolithiasis. Peripheral vascular disease with extensive calcification within the a mid superior mesenteric artery. Patency of this vessel is not well characterized on this examination. Correlation for signs and symptoms of chronic mesenteric ischemia may be helpful. Thickening of the endometrium, abnormal in a patient of this age in absence of hormonal stimulation. Dedicated sonography and possible endometrial biopsy may be helpful for further management. Aortic Atherosclerosis (ICD10-I70.0). Electronically Signed   By: Fidela Salisbury MD   On: 01/23/2020 01:14   DG Chest 2 View  Result Date: 01/22/2020 CLINICAL DATA:  Weakness. EXAM: CHEST - 2 VIEW COMPARISON:  December 23, 2019 FINDINGS: The heart size and mediastinal contours are within normal limits. Both lungs  are clear. The visualized skeletal structures are unremarkable. There is a well-positioned dual chamber left-sided pacemaker in place. IMPRESSION: No active cardiopulmonary disease. Electronically Signed   By: Constance Holster M.D.   On: 01/22/2020 21:52   CT Head Wo Contrast  Result Date: 01/23/2020 CLINICAL DATA:  Recurrent syncope, vomiting, diarrhea EXAM: CT HEAD WITHOUT CONTRAST TECHNIQUE: Contiguous axial images were obtained from the base of the skull through the vertex without intravenous contrast. COMPARISON:  09/19/2018 FINDINGS: Brain: Normal anatomic configuration. Parenchymal volume loss is commensurate with the patient's age. Moderate subcortical and periventricular white matter changes are present likely reflecting the sequela of small vessel ischemia. Remote lacunar infarct noted within the anterior limb of the right internal capsule. No abnormal intra or extra-axial mass lesion or fluid collection. No abnormal mass effect or midline shift. No evidence of acute intracranial hemorrhage or infarct. Borderline enlargement of the lateral ventricles likely reflects ex vacuo dilation secondary to central atrophy. Cerebellum unremarkable. Vascular: No asymmetric hyperdense vasculature at the skull base. Extensive vascular calcifications are noted within the carotid siphons bilaterally. Skull: Intact Sinuses/Orbits: Paranasal sinuses are clear. Orbits are unremarkable. Other: There is opacification of several inferior left mastoid air cells without associated cortical erosion. Remaining mastoid air cells and middle ear cavities bilaterally are clear. IMPRESSION: No acute intracranial hemorrhage or infarct. Advanced senescent changes. Borderline ventricular enlargement. Electronically  Signed   By: Fidela Salisbury MD   On: 01/23/2020 01:02   DG OR UROLOGY CYSTO IMAGE ([place])  Result Date: 01/23/2020 There is no interpretation for this exam.  This order is for images obtained during a surgical  procedure.  Please See "Surgeries" Tab for more information regarding the procedure.      Assessment & Plan: Hailey Lopez is a 87 y.o. black female with dementia, pulmonary embolism, hypertension, hyperlipidemia, CVA, atrial fibrillation, who was admitted to [facility] on 01/22/2020 for Ureterolithiasis [N20.1] Delirium [R41.0] Septic shock ([place]) [A41.9, R65.21] Sepsis ([place]) [A41.9] Urinary tract infection without hematuria, site unspecified [N39.0] Hydronephrosis with ureteropelvic junction (UPJ) obstruction [Q62.11] Dementia without behavioral disturbance, unspecified dementia type ([place]) [F03.90] Acute renal failure superimposed on chronic kidney disease, unspecified CKD stage, unspecified acute renal failure type ([place]) [N17.9, N18.9]  Patient underwent cystoscopy and left ureteral stent placed on admission.   1. Acute kidney injury on chronic kidney disease stage IV: baseline creatinine of 1.95, GFR of 24 on 12/26/19.  Acute kidney injury from obstructive uropathy.  Patient with anuric urine output.  No indication for dialysis. Not a candidate for long term dialysis.  - Continue IV fluids: Lactated Ringer at 51mL/hr  2. Urinary tract infection: with E. Coli on blood cultures.  - empiric ceftriaxone.   LOS: 0 Leeroy Lovings 1/5/20221:28 PM

## 2020-01-23 NOTE — Sepsis Progress Note (Signed)
Monitoring for sepsis protocol. °

## 2020-01-23 NOTE — Consult Note (Signed)
NAME: Hailey Lopez  DOB: November 12, 1929  MRN: [id number]  Date/Time: 01/23/2020 2:32 PM  REQUESTING PROVIDER: Dr.Kasa Subjective:  REASON FOR CONSULT: E.coli bacteremia ?No history available from patient Hailey Lopez is a 87 y.o. female with a history of dementia, hypertension, hyperlipidemia, atrial fibrillation, DVT and PE on Coumadin and stage IV chronic kidney disease presents to the emergency room on 01/22/2020 with weakness and vomiting and diarrhea for 1 day duration.  She started with acute episodes of vomiting at least 5 times that day and passed out after having vomiting and one episode of diarrhea.  She was recently in the hospital between 12/5 and 12/27/2019 for vomiting and and was treated empirically for an urinary tract infection. . In the ED her BP was 145/108, heart rate 136, temp 98.6, respiratory rate 16.  Sats 92%. Labs showed sodium of 146, BUN 44, creatinine 3.48 and WBC of 11.7.  Urine analysis showed more than 50 WBC.  blood culture was sent.  CT abdomen was done.  This showed mild left hydronephrosis and perinephric stranding secondary to an obstructing 8 mm into 15 mm into 15 mm calculus within the left ureteropelvic junction.  An additional 10 mm staghorn calculus was seen within the terminal lower pole calyx. Extensive aortoiliac atherosclerotic calcification was noted.  There was moderate sigmoid diverticulosis.  She was initially given a dose of cefepime.  Urologist was consulted and she underwent cystoscopy and left ureteral stent placement.  As blood cultures came back as E. coli cefepime has been switched to ceftriaxone and I am seeing the patient for the same.     Past Medical History:  Diagnosis Date  . Alzheimer's disease ([place])   . Atrial fibrillation ([place])   . Chronic kidney disease    Stage 4  . CVA (cerebrovascular accident) ([place])   . DVT (deep venous thrombosis) ([place]) 2013   LLE  . Hyperlipidemia   . Hypertension   . Pacemaker   . Pulmonary embolism  [facility])     Past Surgical History:  Procedure Laterality Date  . CATARACT EXTRACTION, BILATERAL    . PACEMAKER IMPLANT  2013    Social History   Socioeconomic History  . Marital status: Widowed    Spouse name: Not on file  . Number of children: 1  . Years of education: Not on file  . Highest education level: Associate degree: academic program  Occupational History    Comment: retired  Tobacco Use  . Smoking status: Former Research scientist (life sciences)  . Smokeless tobacco: Never Used  Substance and Sexual Activity  . Alcohol use: Not Currently  . Drug use: Never  . Sexual activity: Not on file  Other Topics Concern  . Not on file  Social History Narrative   10/24/18 lives with Ship broker, [name], from [place], husband passed in 208, only child (son) passed 24   Social Determinants of Health   Financial Resource Strain: Not on file  Food Insecurity: Not on file  Transportation Needs: Not on file  Physical Activity: Not on file  Stress: Not on file  Social Connections: Not on file  Intimate Partner Violence: Not on file    Family History  Problem Relation Age of Onset  . Dementia Mother   . Heart attack Father   . Stroke Father   . Hypertension Father   . Heart attack Sister   . Heart attack Brother   . Heart attack Brother   . Stroke Brother   . Hypertension Brother   . Diabetes Brother   .  Hyperlipidemia Sister   . Dementia Sister    No Known Allergies  ? Current Facility-Administered Medications  Medication Dose Route Frequency Provider Last Rate Last Admin  . 0.9 %  sodium chloride infusion   Intravenous PRN Flora Lipps, MD 10 mL/hr at 01/23/20 1338 250 mL at 01/23/20 1338  . acetaminophen (TYLENOL) tablet 650 mg  650 mg Oral Q4H PRN Darel Hong D, NP      . cefTRIAXone (ROCEPHIN) 2 g in sodium chloride 0.9 % 100 mL IVPB  2 g Intravenous Q24H Flora Lipps, MD 200 mL/hr at 01/23/20 1347 2 g at 01/23/20 1347  . Chlorhexidine Gluconate Cloth 2 % PADS 6 each  6 each Topical  Q0600 Bradly Bienenstock, NP   6 each at 01/23/20 0636  . docusate sodium (COLACE) capsule 100 mg  100 mg Oral BID PRN Bradly Bienenstock, NP      . lactated ringers infusion   Intravenous Continuous Bradly Bienenstock, NP   Stopped at 01/23/20 1114  . ondansetron (ZOFRAN) injection 4 mg  4 mg Intravenous Q6H PRN Darel Hong D, NP      . polyethylene glycol (MIRALAX / GLYCOLAX) packet 17 g  17 g Oral Daily PRN Darel Hong D, NP      . warfarin (COUMADIN) tablet 3 mg  3 mg Oral q1600 Flora Lipps, MD      . Warfarin - Pharmacist Dosing Inpatient   Does not apply [id number] Flora Lipps, MD         Abtx:  Anti-infectives (From admission, onward)   Start     Dose/Rate Route Frequency Ordered Stop   01/23/20 1400  cefTRIAXone (ROCEPHIN) 2 g in sodium chloride 0.9 % 100 mL IVPB        2 g 200 mL/hr over 30 Minutes Intravenous Every 24 hours 01/23/20 1015     01/23/20 0600  piperacillin-tazobactam (ZOSYN) IVPB 2.25 g  Status:  Discontinued        2.25 g 100 mL/hr over 30 Minutes Intravenous Every 8 hours 01/23/20 0203 01/23/20 1015   01/23/20 0115  ceFEPIme (MAXIPIME) 2 g in sodium chloride 0.9 % 100 mL IVPB        2 g 200 mL/hr over 30 Minutes Intravenous  Once 01/23/20 0109 01/23/20 0217      REVIEW OF SYSTEMS:  NA: Objective:  VITALS:  BP 106/62 (BP Location: Right Arm)   Pulse 91   Temp 99.4 F (37.4 C)   Resp 20   Ht 5\' 4"  (1.626 m)   Wt 63.5 kg   SpO2 95%   BMI 24.03 kg/m  PHYSICAL EXAM:  General: Patient lying with eyes closed.  On examination she opens her eyes.  But she is nonverbal.  Does not follow commands.  She is emaciated. Not very cooperative with the examination. ENT unable to examine  Neck: , symmetrical, no adenopathy, thyroid: non tender no carotid bruit and no JVD. Lungs: Bilateral air entry. Heart: Regular rate and rhythm, no murmur, rub or gallop. Abdomen: Soft, non-tender,not distended. Bowel sounds normal. No masses Extremities: atraumatic, no  cyanosis. No edema. No clubbing Skin: No rashes or lesions. Or bruising Lymph: Cervical, supraclavicular normal. Neurologic: Cannot be assessed Pertinent Labs Lab Results CBC    Component Value Date/Time   WBC 11.7 (H) 01/22/2020 2059   RBC 4.18 01/22/2020 2059   HGB 12.9 01/22/2020 2059   HCT 39.5 01/22/2020 2059   PLT 185 01/22/2020 2059   MCV 94.5 01/22/2020 2059  MCH 30.9 01/22/2020 2059   MCHC 32.7 01/22/2020 2059   RDW 14.8 01/22/2020 2059   LYMPHSABS 1.2 12/26/2019 0452   MONOABS 0.4 12/26/2019 0452   EOSABS 0.2 12/26/2019 0452   BASOSABS 0.0 12/26/2019 0452    CMP Latest Ref Rng & Units 01/23/2020 01/22/2020 12/26/2019  Glucose 70 - 99 mg/dL 122(H) 183(H) 88  BUN 8 - 23 mg/dL 47(H) 44(H) 24(H)  Creatinine 0.44 - 1.00 mg/dL 3.82(H) 3.48(H) 1.95(H)  Sodium 135 - 145 mmol/L 145 146(H) 141  Potassium 3.5 - 5.1 mmol/L 4.1 3.5 3.9  Chloride 98 - 111 mmol/L 109 110 113(H)  CO2 22 - 32 mmol/L 19(L) 20(L) 21(L)  Calcium 8.9 - 10.3 mg/dL 10.1 10.3 8.2(L)  Total Protein 6.5 - 8.1 g/dL - 7.1 -  Total Bilirubin 0.3 - 1.2 mg/dL - 0.9 -  Alkaline Phos 38 - 126 U/L - 68 -  AST 15 - 41 U/L - 41 -  ALT 0 - 44 U/L - 16 -      Microbiology: Recent Results (from the past 240 hour(s))  Blood Culture (routine x 2)     Status: None (Preliminary result)   Collection Time: 01/23/20 12:13 AM   Specimen: BLOOD  Result Value Ref Range Status   Specimen Description BLOOD LEFT ANTECUBITAL  Final   Special Requests   Final    BOTTLES DRAWN AEROBIC AND ANAEROBIC Blood Culture results may not be optimal due to an inadequate volume of blood received in culture bottles   Culture  Setup Time   Final    GRAM NEGATIVE RODS IN BOTH AEROBIC AND ANAEROBIC BOTTLES CRITICAL VALUE NOTED.  VALUE IS CONSISTENT WITH PREVIOUSLY REPORTED AND CALLED VALUE. Performed at [facility], [address] [place], [place] [address]    Culture GRAM NEGATIVE RODS  Final   Report Status PENDING   Incomplete  Blood Culture (routine x 2)     Status: None (Preliminary result)   Collection Time: 01/23/20 12:13 AM   Specimen: BLOOD  Result Value Ref Range Status   Specimen Description   Final    BLOOD BLOOD RIGHT FOREARM Performed at [facility], [address] [place], [place] [address]    Special Requests   Final    BOTTLES DRAWN AEROBIC AND ANAEROBIC Blood Culture results may not be optimal due to an inadequate volume of blood received in culture bottles Performed at [facility], [address], [place], [place] [address]    Culture  Setup Time   Final    GRAM NEGATIVE RODS IN BOTH AEROBIC AND ANAEROBIC BOTTLES CRITICAL RESULT CALLED TO, READ BACK BY AND VERIFIED WITH: M.SLAUGHTER,PHARMD AT 1012 ON 01/23/20 BY GM Performed at [facility], [place] [address] [place],  [address]    Culture GRAM NEGATIVE RODS  Final   Report Status PENDING  Incomplete  SARS CORONAVIRUS 2 (TAT 6-24 HRS) Nasopharyngeal Nasopharyngeal Swab     Status: None   Collection Time: 01/23/20 12:13 AM   Specimen: Nasopharyngeal Swab  Result Value Ref Range Status   SARS Coronavirus 2 NEGATIVE NEGATIVE Final    Comment: (NOTE) SARS-CoV-2 target nucleic acids are NOT DETECTED.  The SARS-CoV-2 RNA is generally detectable in upper and lower respiratory specimens during the acute phase of infection. Negative results do not preclude SARS-CoV-2 infection, do not rule out co-infections with other pathogens, and should not be used as the sole basis for treatment or other patient management decisions. Negative results must be  combined with clinical observations, patient history, and epidemiological information. The expected result is Negative.  Fact Sheet for Patients: SugarRoll.be  Fact Sheet for Healthcare Providers: https://www.woods-mathews.com/  This test is not yet approved or cleared by the [place] FDA and  has been  authorized for detection and/or diagnosis of SARS-CoV-2 by FDA under an Emergency Use Authorization (EUA). This EUA will remain  in effect (meaning this test can be used) for the duration of the COVID-19 declaration under Se ction 564(b)(1) of the Act, 21 U.S.C. section 360bbb-3(b)(1), unless the authorization is terminated or revoked sooner.  Performed at [facility], [place] [address] [place], [place] [address]   Blood Culture ID Panel (Reflexed)     Status: Abnormal   Collection Time: 01/23/20 12:13 AM  Result Value Ref Range Status   Enterococcus faecalis NOT DETECTED NOT DETECTED Final   Enterococcus Faecium NOT DETECTED NOT DETECTED Final   Listeria monocytogenes NOT DETECTED NOT DETECTED Final   Staphylococcus species NOT DETECTED NOT DETECTED Final   Staphylococcus aureus (BCID) NOT DETECTED NOT DETECTED Final   Staphylococcus epidermidis NOT DETECTED NOT DETECTED Final   Staphylococcus lugdunensis NOT DETECTED NOT DETECTED Final   Streptococcus species NOT DETECTED NOT DETECTED Final   Streptococcus agalactiae NOT DETECTED NOT DETECTED Final   Streptococcus pneumoniae NOT DETECTED NOT DETECTED Final   Streptococcus pyogenes NOT DETECTED NOT DETECTED Final   A.calcoaceticus-baumannii NOT DETECTED NOT DETECTED Final   Bacteroides fragilis NOT DETECTED NOT DETECTED Final   Enterobacterales DETECTED (A) NOT DETECTED Final    Comment: Enterobacterales represent a large order of gram negative bacteria, not a single organism. CRITICAL RESULT CALLED TO, READ BACK BY AND VERIFIED WITH: M.SLAUGHTER,PHARMD AT 1012 ON 01/23/20 BY GM    Enterobacter cloacae complex NOT DETECTED NOT DETECTED Final   Escherichia coli DETECTED (A) NOT DETECTED Final    Comment: CRITICAL RESULT CALLED TO, READ BACK BY AND VERIFIED WITH: M.SLAUGHTER,PHARMD AT 1012 ON 01/23/20 BY GM    Klebsiella aerogenes NOT DETECTED NOT DETECTED Final   Klebsiella oxytoca NOT DETECTED NOT DETECTED Final   Klebsiella  pneumoniae NOT DETECTED NOT DETECTED Final   Proteus species NOT DETECTED NOT DETECTED Final   Salmonella species NOT DETECTED NOT DETECTED Final   Serratia marcescens NOT DETECTED NOT DETECTED Final   Haemophilus influenzae NOT DETECTED NOT DETECTED Final   Neisseria meningitidis NOT DETECTED NOT DETECTED Final   Pseudomonas aeruginosa NOT DETECTED NOT DETECTED Final   Stenotrophomonas maltophilia NOT DETECTED NOT DETECTED Final   Candida albicans NOT DETECTED NOT DETECTED Final   Candida auris NOT DETECTED NOT DETECTED Final   Candida glabrata NOT DETECTED NOT DETECTED Final   Candida krusei NOT DETECTED NOT DETECTED Final   Candida parapsilosis NOT DETECTED NOT DETECTED Final   Candida tropicalis NOT DETECTED NOT DETECTED Final   Cryptococcus neoformans/gattii NOT DETECTED NOT DETECTED Final   CTX-M ESBL NOT DETECTED NOT DETECTED Final   Carbapenem resistance IMP NOT DETECTED NOT DETECTED Final   Carbapenem resistance KPC NOT DETECTED NOT DETECTED Final   Carbapenem resistance NDM NOT DETECTED NOT DETECTED Final   Carbapenem resist OXA 48 LIKE NOT DETECTED NOT DETECTED Final   Carbapenem resistance VIM NOT DETECTED NOT DETECTED Final    Comment: Performed at [facility], [address] [place], [place] [address]  MRSA PCR Screening     Status: None   Collection Time: 01/23/20  5:23 AM   Specimen: Nasopharyngeal  Result Value Ref Range Status  MRSA by PCR NEGATIVE NEGATIVE Final    Comment:        The GeneXpert MRSA Assay (FDA approved for NASAL specimens only), is one component of a comprehensive MRSA colonization surveillance program. It is not intended to diagnose MRSA infection nor to guide or monitor treatment for MRSA infections. Performed at [facility], [address] [place], [place] [address]     IMAGING RESULTS: CT abdomen and pelvis mild left hydronephrosis and perinephric stranding secondary to an obstructing 15 mm calculus within  the left ureteropelvic junction. Superimposed nonobstructing lower pole nephrolithiasis. I have personally reviewed the films ? Impression/Recommendation  E. coli bacteremia likely due to renal source Left pelviureteric junction obstruction due to calculus with left mild hydronephrosis with complicated urinary tract infection S/p stent placement Continue ceftriaxone until the susceptibilities available. Sepsis secondary to the above. ? ?AKI on CKD secondary to infection and obstruction.  Patient seen by nephrologist.  History of PE and DVT on warfarin.  Dementia.  Recommend palliative consult to discuss treatment goals. ___________________________________________________ Discussed with care team. Note:  This document was prepared using Dragon voice recognition software and may include unintentional dictation errors.

## 2020-01-23 NOTE — Progress Notes (Signed)
Pharmacy Antibiotic Note  Hailey Lopez is a 87 y.o. female admitted on 01/22/2020 with sepsis. Pharmacy has been consulted for Zosyn dosing.  Plan: Zosyn 2.25gm IV q8hrs (renally adjusted)  Height: 5\' 4"  (162.6 cm) Weight: 63.5 kg (140 lb) IBW/kg (Calculated) : 54.7  Temp (24hrs), Avg:98.6 F (37 C), Min:98.6 F (37 C), Max:98.6 F (37 C)  Recent Labs  Lab 01/22/20 2059 01/23/20 0013  WBC 11.7*  --   CREATININE 3.48*  --   LATICACIDVEN  --  8.1*    Estimated Creatinine Clearance: 9.6 mL/min (A) (by C-G formula based on SCr of 3.48 mg/dL (H)).    No Known Allergies  Antimicrobials this admission:   >>    >>   Dose adjustments this admission:   Microbiology results:  BCx:   UCx:    Sputum:    MRSA PCR:   Thank you for allowing pharmacy to be a part of this patient's care.  Hart Robinsons A 01/23/2020 2:04 AM

## 2020-01-23 NOTE — Consult Note (Signed)
Urology Consult  Requesting physician: Dr. Karma Greaser  Reason for consultation: Obstructing left proximal ureteral calculus with sepsis  Chief Complaint: N/A  History of Present Illness: Hailey Lopez is a 87 y.o. y.o. female with Alzheimer's dementia, atrial fibrillation and chronic kidney disease.  She was brought to the ED last night by her niece who is her primary caregiver with acute onset of severe symptoms including multiple episodes of vomiting, diarrhea and syncope.  She has had significant weakness and significant worsening from her baseline mental status.  She was hospitalized last month with a urinary tract infection and her niece states she was recovering well.  Evaluation in the ED remarkable for an elevated lactate which is increasing and at last check was 8.1.  She had a significant elevation of procalcitonin.  Urinalysis showed >50 WBCs.  A CT of the abdomen pelvis without contrast was remarkable for an 8 x 15 x 15 mm left proximal ureteral calculus with perinephric stranding and mild left hydronephrosis.  There was also a 10 mm nonobstructing lower pole staghorn calculus.  Past Medical History:  Diagnosis Date  . Alzheimer's disease ([place])   . Atrial fibrillation ([place])   . Chronic kidney disease    Stage 4  . CVA (cerebrovascular accident) ([place])   . DVT (deep venous thrombosis) ([place]) 2013   LLE  . Hyperlipidemia   . Hypertension   . Pacemaker   . Pulmonary embolism [facility])     Past Surgical History:  Procedure Laterality Date  . CATARACT EXTRACTION, BILATERAL    . PACEMAKER IMPLANT  2013    Home Medications:  No outpatient medications have been marked as taking for the 01/22/20 encounter [facility] Encounter).    Allergies: No Known Allergies  Family History  Problem Relation Age of Onset  . Dementia Mother   . Heart attack Father   . Stroke Father   . Hypertension Father   . Heart attack Sister   . Heart attack Brother   . Heart attack Brother   .  Stroke Brother   . Hypertension Brother   . Diabetes Brother   . Hyperlipidemia Sister   . Dementia Sister     Social History:  reports that she has quit smoking. She has never used smokeless tobacco. She reports previous alcohol use. She reports that she does not use drugs.  ROS: A complete review of systems was performed.  All systems are negative except for pertinent findings as noted.  Physical Exam:  Vital signs in last 24 hours: Temp:  [98.6 F (37 C)] 98.6 F (37 C) (01/04 2050) Pulse Rate:  [90-136] 90 (01/05 0130) Resp:  [16-28] 20 (01/05 0130) BP: (81-145)/(57-108) 117/70 (01/05 0130) SpO2:  [94 %-98 %] 95 % (01/05 0130) Weight:  [63.5 kg] 63.5 kg (01/04 2056) Constitutional:  Alert, No acute distress Cardiovascular: Regular rhythm, tachy, . Respiratory: Normal respiratory effort, lungs clear bilaterally GI: Abdomen is soft, nontender, nondistended, no abdominal masses GU: No CVA tenderness Skin: No rashes, bruises or suspicious lesions Lymph: No cervical or inguinal adenopathy Neurologic: Grossly intact, no focal deficits, moving all 4 extremities Psychiatric: Normal mood and affect   Laboratory Data:  Recent Labs    01/22/20 2059  WBC 11.7*  HGB 12.9  HCT 39.5   Recent Labs    01/22/20 2059  NA 146*  K 3.5  CL 110  CO2 20*  GLUCOSE 183*  BUN 44*  CREATININE 3.48*  CALCIUM 10.3   Recent Labs  01/22/20 2059  INR 2.1*    Radiologic Imaging: CT images were personally reviewed and interpreted  CT ABDOMEN PELVIS WO CONTRAST  Result Date: 01/23/2020 CLINICAL DATA:  Bowel obstruction, vomiting, diarrhea EXAM: CT ABDOMEN AND PELVIS WITHOUT CONTRAST TECHNIQUE: Multidetector CT imaging of the abdomen and pelvis was performed following the standard protocol without IV contrast. COMPARISON:  10/07/2017 FINDINGS: Lower chest: There is left-sided volume loss again identified with mediastinal shift to the left. Mild global cardiomegaly. Pacemaker leads are  seen within the right atrium and within the right ventricle embedded within the intraventricular septum. No pericardial effusion. Hepatobiliary: No focal liver abnormality is seen. No gallstones, gallbladder wall thickening, or biliary dilatation. Pancreas: Unremarkable Spleen: Unremarkable Adrenals/Urinary Tract: There has developed mild left hydronephrosis and perinephric stranding secondary to an obstructing 8 mm x 15 mm x 15 mm calculus within the left ureteropelvic junction. An additional 10 mm staghorn calculus is seen within the terminal lower pole calyx. Stable exophytic simple cortical cyst noted within the left kidney. The right kidney is unremarkable; no intrarenal or ureteral calculi are noted on the right and there is no hydronephrosis on the right. The bladder is unremarkable. Stomach/Bowel: Moderate sigmoid diverticulosis. Mild diverticulosis of the cecum also noted. The stomach, small bowel, and large bowel are otherwise unremarkable. No evidence of obstruction or focal inflammation. The appendix is absent. Vascular/Lymphatic: Extensive aortoiliac atherosclerotic calcification. Extensive calcification noted within the mid and distal superior mesenteric artery. No aortic aneurysm. Inferior vena cava filter in expected position within the infrarenal cava. No pathologic adenopathy within the abdomen and pelvis. Reproductive: Large involuted densely calcified fibroid noted within the uterine fundus. The endometrium is thickened, abnormal in a patient of this age in absence of hormonal stimulation. No adnexal masses are seen. Other: Small fat containing umbilical hernia. Musculoskeletal: Degenerative changes are seen within the lumbar spine. No lytic or blastic bone lesion is seen. IMPRESSION: Interval development of a mild left hydronephrosis and perinephric stranding secondary to an obstructing 15 mm calculus within the left ureteropelvic junction. Superimposed nonobstructing lower pole nephrolithiasis.  Peripheral vascular disease with extensive calcification within the a mid superior mesenteric artery. Patency of this vessel is not well characterized on this examination. Correlation for signs and symptoms of chronic mesenteric ischemia may be helpful. Thickening of the endometrium, abnormal in a patient of this age in absence of hormonal stimulation. Dedicated sonography and possible endometrial biopsy may be helpful for further management. Aortic Atherosclerosis (ICD10-I70.0). Electronically Signed   By: Fidela Salisbury MD   On: 01/23/2020 01:14   DG Chest 2 View  Result Date: 01/22/2020 CLINICAL DATA:  Weakness. EXAM: CHEST - 2 VIEW COMPARISON:  December 23, 2019 FINDINGS: The heart size and mediastinal contours are within normal limits. Both lungs are clear. The visualized skeletal structures are unremarkable. There is a well-positioned dual chamber left-sided pacemaker in place. IMPRESSION: No active cardiopulmonary disease. Electronically Signed   By: Constance Holster M.D.   On: 01/22/2020 21:52   CT Head Wo Contrast  Result Date: 01/23/2020 CLINICAL DATA:  Recurrent syncope, vomiting, diarrhea EXAM: CT HEAD WITHOUT CONTRAST TECHNIQUE: Contiguous axial images were obtained from the base of the skull through the vertex without intravenous contrast. COMPARISON:  09/19/2018 FINDINGS: Brain: Normal anatomic configuration. Parenchymal volume loss is commensurate with the patient's age. Moderate subcortical and periventricular white matter changes are present likely reflecting the sequela of small vessel ischemia. Remote lacunar infarct noted within the anterior limb of the right internal capsule. No abnormal  intra or extra-axial mass lesion or fluid collection. No abnormal mass effect or midline shift. No evidence of acute intracranial hemorrhage or infarct. Borderline enlargement of the lateral ventricles likely reflects ex vacuo dilation secondary to central atrophy. Cerebellum unremarkable. Vascular: No  asymmetric hyperdense vasculature at the skull base. Extensive vascular calcifications are noted within the carotid siphons bilaterally. Skull: Intact Sinuses/Orbits: Paranasal sinuses are clear. Orbits are unremarkable. Other: There is opacification of several inferior left mastoid air cells without associated cortical erosion. Remaining mastoid air cells and middle ear cavities bilaterally are clear. IMPRESSION: No acute intracranial hemorrhage or infarct. Advanced senescent changes. Borderline ventricular enlargement. Electronically Signed   By: Fidela Salisbury MD   On: 01/23/2020 01:02   DG OR UROLOGY CYSTO IMAGE ([place])  Result Date: 01/23/2020 There is no interpretation for this exam.  This order is for images obtained during a surgical procedure.  Please See "Surgeries" Tab for more information regarding the procedure.    Impression/Assessment:   Critically ill patient with sepsis most likely secondary to an obstructing left proximal ureteral calculus   Recommendation:    Findings were discussed in detail with her niece  Recommended urgent cystoscopy with left ureteral stent placement  The procedure was discussed in detail including potential risks of bleeding, worsening sepsis and ureteral injury  We discussed in a small percentage of cases stent placement may not be successful due to stone impaction and would require placement of percutaneous nephrostomy tube  Primary percutaneous nephrostomy tube was also discussed however would require transfer and reversal of her anticoagulation  All questions were answered and she desires to proceed    01/23/2020, 2:27 AM  John Giovanni,  MD

## 2020-01-23 NOTE — H&P (Addendum)
NAME:  Hailey Lopez, MRN:  [id number], DOB:  [birth year], LOS: 0 ADMISSION DATE:  01/22/2020, CONSULTATION DATE:  01/23/2020 REFERRING MD:  Dr. Karma Greaser, CHIEF COMPLAINT:  Weakness & Emesis  Brief History:  87 y.o. Female admitted with Urosepsis in the setting of Obstructing Left Proximal Ureteral Calculus. Taken for emergent Cystoscopy with left ureteral stent placement.  Returns to ICU post-procedure.     History of Present Illness:  Hailey Lopez is a 87 y.o. Female with a past medical history significant for Atrial fibrillation on Warfarin, CKD Stage IV, HTN, HLD, CVA, and Alzheimer's who presented to [facility] ED on 01/22/20 due to complaints of vomiting, one episode of diarrhea, and syncope.  She is also noted to be much weaker than normal and unable to walk (which normally she is able to ambulate).  Pt is currently altered, therefore history is obtained from pt's niece at bedside who is her primary caregiver.  Niece reports she was in her normal state of health until she developed the mentioned symptoms acutely last night.  She also reports she was treated for a UTI about 1 month ago.  The patient denied chest pain, abdominal pain, shortness of breath.  ED Course: Upon presentation to the ED vital signs included temperature 98.6 F, blood pressure 145/108, pulse 136, SPO2 94% on room air.  EKG with sinus tachycardia and nonspecific ST segment/T wave changes, but no definitive evidence of acute ischemia. Work-up revealed sodium 146, bicarb 20, anion gap 16, BUN 44, creatinine 3.45, lactic acid 8.1, procalcitonin 80.51, WBC 11.7, and INR 2.1. Urinalysis is consistent with UTI.  Chest x-ray is negative for any acute cardiopulmonary process.  CT head is negative.  CT abdomen and pelvis showed a mild left hydronephrosis and perinephric stranding due to obstructing 15 mm calculus in the left ureteropelvic junction.  Was also concerning for peripheral vascular disease with extensive calcification to the mid  superior mesenteric artery.  She met sepsis criteria therefore she was given 2 L of lactated ringer boluses along with IV cefepime.  Urology was consulted.  Her COVID-19 PCR and Influenza PCR are both negative.  PCCM is asked to admit the patient to ICU for further work-up and treatment of Urosepsis in the setting of Obstructing Left Proximal Ureteral Calculus. Taken for emergent Cystoscopy with left ureteral stent placement.  Returns to ICU post-procedure.  Past Medical History:  Atrial fibrillation on warfarin Chronic kidney disease stage IV Hypertension Hyperlipidemia CVA Alzheimer's dementia  Significant Hospital Events:  1/5: Taken for emergent cystoscopy and stent placement; admission to ICU  Consults:  PCCM Urology Nephrology  Procedures:  1/5:Cystoscopy with left ureteral stent placement  Significant Diagnostic Tests:  1/4: CXR>>No active cardiopulmonary disease. 1/5: CT Head>>No acute intracranial hemorrhage or infarct. Advanced senescent changes. Borderline ventricular enlargement. 1/5: CT Abdomen/Pelvis>>Interval development of a mild left hydronephrosis and perinephric stranding secondary to an obstructing 15 mm calculus within the left ureteropelvic junction. Superimposed nonobstructing lower pole nephrolithiasis. Peripheral vascular disease with extensive calcification within the a mid superior mesenteric artery. Patency of this vessel is not well characterized on this examination. Correlation for signs and symptoms of chronic mesenteric ischemia may be helpful. Thickening of the endometrium, abnormal in a patient of this age in absence of hormonal stimulation. Dedicated sonography and possible endometrial biopsy may be helpful for further management. Aortic Atherosclerosis  Micro Data:  1/5: SARS-CoV-2 POC Ag>>negative 1/5: SARS-CoV-2 PCR>> 1/5: Urine>> 1/5: Blood x2>>  Antimicrobials:  Cefepime 1/5 x1 dose Zosyn 1/5>>  Interim  History / Subjective:   Pt currently sitting in bed on room air, no acute distress Hemodynamically stable, afebrile Urology planning for emergent Cystoscopy with left ureteral stent placement  Objective   Blood pressure 117/70, pulse 90, temperature 98.6 F (37 C), resp. rate 20, height _0  (1.626 m), weight 63.5 kg, SpO2 95 %.        Intake/Output Summary (Last 24 hours) at 01/23/2020 0206 Last data filed at 01/23/2020 0139 Gross per 24 hour  Intake 1000 ml  Output --  Net 1000 ml   Filed Weights   01/22/20 2056  Weight: 63.5 kg    Examination: General: Acutely ill appearing female, sitting in bed, on RA, in NAD HENT: Atraumatic, normocephalic, neck supple, no JVD Lungs: Clear to auscultation bilterally, even, nonlabored Cardiovascular: Regular rate and rhythm, s1s2, no M/R/G Abdomen: Soft, nontender, nondistended, no guarding or rebound tenderness, BS + x4 Extremities: No deformities, no edema Neuro: Awake, eyes open, will not follow commands upon command but has purposeful movement, pupils PERRLA Skin: warm and dry.  No obvious rashes, lesions, or ulcerations  Resolved Hospital Problem list   N/A  Assessment & Plan:   Severe Sepsis in the setting of UTI Mildly Elevated Troponin, suspect demand ischemia -Continuous cardiac monitoring -Maintain MAP >65 -Received IV fluid resuscitation in ED -Vasopressors if needed -Trend lactic acid -Trend troponin until downtrending -Consider Echocardiogram   UTI in the setting of Obstructing Left Proximal Ureteral Calculus -Monitor fever curve -Trend WBC's and procalcitonin -Follow cultures as above -Place on Zosyn -Urology consulted, appreciate input -Plan for emergent Cystoscopy with left ureteral stent placement     AKI superimposed on CKD IV Anion Gap Metabolic Acidosis in the setting of Lactic Acidosis -Monitor I&O's / urinary output -Follow BMP -Ensure adequate renal perfusion -Avoid nephrotoxic agents as able -Replace electrolytes as  indicated -IV Fluids -Trend lactic acid -Nephrology consulted, appreciate input   Hyperglycemia -CBG's -SSI -Follow ICU Hypo/hyperglycemia protocol   Best practice (evaluated daily)  Diet: NPO Pain/Anxiety/Delirium protocol (if indicated): N/A VAP protocol (if indicated): N/A DVT prophylaxis: SCD's for now due to pending procedure GI prophylaxis: N/A Glucose control: SSI Mobility: Bedrest Disposition:ICU  Goals of Care:  Last date of multidisciplinary goals of care discussion:N/A Family and staff present: Pt's Niece (primary caregiver) at bedside Summary of discussion: Sepsis due to obstructing stone, will require cystoscopy for stent placement.  Likely will become more acutely ill following stent placement before improving Follow up goals of care discussion due: 01/23/20 Code Status: Full code for now (niece states she thinks she has DNR paperwork, will attempt to find it today).  Pt has estranged step-son in [place] who is POA.  Labs   CBC: Recent Labs  Lab 01/22/20 2059  WBC 11.7*  HGB 12.9  HCT 39.5  MCV 94.5  PLT 109    Basic Metabolic Panel: Recent Labs  Lab 01/22/20 2059  NA 146*  K 3.5  CL 110  CO2 20*  GLUCOSE 183*  BUN 44*  CREATININE 3.48*  CALCIUM 10.3   GFR: Estimated Creatinine Clearance: 9.6 mL/min (A) (by C-G formula based on SCr of 3.48 mg/dL (H)). Recent Labs  Lab 01/22/20 2059 01/23/20 0013  PROCALCITON 80.51  --   WBC 11.7*  --   LATICACIDVEN  --  8.1*    Liver Function Tests: Recent Labs  Lab 01/22/20 2330  AST 41  ALT 16  ALKPHOS 68  BILITOT 0.9  PROT 7.1  ALBUMIN 3.3*   Recent  Labs  Lab 01/22/20 2330  LIPASE 69*   No results for input(s): AMMONIA in the last 168 hours.  ABG No results found for: PHART, PCO2ART, PO2ART, HCO3, TCO2, ACIDBASEDEF, O2SAT   Coagulation Profile: Recent Labs  Lab 01/22/20 2059  INR 2.1*    Cardiac Enzymes: No results for input(s): CKTOTAL, CKMB, CKMBINDEX, TROPONINI in the  last 168 hours.  HbA1C: No results found for: HGBA1C  CBG: No results for input(s): GLUCAP in the last 168 hours.  Review of Systems:   Unable to assess due to AMS    Past Medical History:  She,  has a past medical history of Alzheimer's disease (Grandfather), Atrial fibrillation ([place]), Chronic kidney disease, CVA (cerebrovascular accident) ([place]), DVT (deep venous thrombosis) ([place]) (2013), Hyperlipidemia, Hypertension, Pacemaker, and Pulmonary embolism ([place]).   Surgical History:   Past Surgical History:  Procedure Laterality Date  . CATARACT EXTRACTION, BILATERAL    . PACEMAKER IMPLANT  2013     Social History:   reports that she has quit smoking. She has never used smokeless tobacco. She reports previous alcohol use. She reports that she does not use drugs.   Family History:  Her family history includes Dementia in her mother and sister; Diabetes in her brother; Heart attack in her brother, brother, father, and sister; Hyperlipidemia in her sister; Hypertension in her brother and father; Stroke in her brother and father.   Allergies No Known Allergies   Home Medications  Prior to Admission medications   Medication Sig Start Date End Date Taking? Authorizing Provider  atorvastatin (LIPITOR) 10 MG tablet Take 10 mg by mouth daily.    [provider]  calcitRIOL (ROCALTROL) 0.5 MCG capsule Take 0.5 mcg by mouth at bedtime.     [provider]  donepezil (ARICEPT) 5 MG tablet Take 5 mg by mouth at bedtime. 08/08/19   [provider]  isosorbide dinitrate (ISORDIL) 5 MG tablet Take 5 mg by mouth daily.     [provider]  polyethylene glycol (MIRALAX / GLYCOLAX) packet Take 17 g by mouth daily as needed for mild constipation or moderate constipation.     [provider]  warfarin (COUMADIN) 3 MG tablet Take 1 tablet by mouth once daily 01/17/20   Skeet Latch, MD     Critical care time: 60 minutes    Darel Hong,  AGACNP-BC [facility] Pager: [phone]   PCCM ATTENDING ATTESTATION:  I have evaluated patient with the APP, I personally  reviewed database in its entirety and discussed care plan in detail. In addition, this patient was discussed on multidisciplinary rounds.   I agree with assessment and plan.  TRANSFER TO TR[facility]   Maretta Bees Patricia Pesa, M.D.  [facility]  Medical Director [facility] Director [facility] Cardio-Pulmonary Department

## 2020-01-23 NOTE — Progress Notes (Signed)
CODE SEPSIS - PHARMACY COMMUNICATION  **Broad Spectrum Antibiotics should be administered within 1 hour of Sepsis diagnosis**  Time Code Sepsis Called/Page Received: 0139  Antibiotics Ordered: Cefepime, Zosyn  Time of 1st antibiotic administration: 0157  Additional action taken by pharmacy: n/a  If necessary, Name of Provider/Nurse Contacted: n/a    Ena Dawley ,PharmD Clinical Pharmacist  01/23/2020  2:05 AM

## 2020-01-24 DIAGNOSIS — A4151 Sepsis due to Escherichia coli [E. coli]: Secondary | ICD-10-CM | POA: Diagnosis not present

## 2020-01-24 DIAGNOSIS — R6520 Severe sepsis without septic shock: Secondary | ICD-10-CM | POA: Diagnosis not present

## 2020-01-24 DIAGNOSIS — N201 Calculus of ureter: Secondary | ICD-10-CM | POA: Diagnosis not present

## 2020-01-24 DIAGNOSIS — N171 Acute kidney failure with acute cortical necrosis: Secondary | ICD-10-CM | POA: Diagnosis not present

## 2020-01-24 DIAGNOSIS — R7881 Bacteremia: Secondary | ICD-10-CM | POA: Diagnosis not present

## 2020-01-24 DIAGNOSIS — B9620 Unspecified Escherichia coli [E. coli] as the cause of diseases classified elsewhere: Secondary | ICD-10-CM | POA: Diagnosis not present

## 2020-01-24 DIAGNOSIS — D72829 Elevated white blood cell count, unspecified: Secondary | ICD-10-CM

## 2020-01-24 DIAGNOSIS — N390 Urinary tract infection, site not specified: Secondary | ICD-10-CM | POA: Diagnosis not present

## 2020-01-24 LAB — CBC WITH DIFFERENTIAL/PLATELET
Abs Immature Granulocytes: 0.72 10*3/uL — ABNORMAL HIGH (ref 0.00–0.07)
Basophils Absolute: 0.1 10*3/uL (ref 0.0–0.1)
Basophils Relative: 0 %
Eosinophils Absolute: 0.1 10*3/uL (ref 0.0–0.5)
Eosinophils Relative: 0 %
HCT: 34.3 % — ABNORMAL LOW (ref 36.0–46.0)
Hemoglobin: 11.7 g/dL — ABNORMAL LOW (ref 12.0–15.0)
Immature Granulocytes: 5 %
Lymphocytes Relative: 6 %
Lymphs Abs: 1 10*3/uL (ref 0.7–4.0)
MCH: 31.1 pg (ref 26.0–34.0)
MCHC: 34.1 g/dL (ref 30.0–36.0)
MCV: 91.2 fL (ref 80.0–100.0)
Monocytes Absolute: 0.5 10*3/uL (ref 0.1–1.0)
Monocytes Relative: 3 %
Neutro Abs: 13 10*3/uL — ABNORMAL HIGH (ref 1.7–7.7)
Neutrophils Relative %: 86 %
Platelets: 117 10*3/uL — ABNORMAL LOW (ref 150–400)
RBC: 3.76 MIL/uL — ABNORMAL LOW (ref 3.87–5.11)
RDW: 15.1 % (ref 11.5–15.5)
Smear Review: NORMAL
WBC: 15.2 10*3/uL — ABNORMAL HIGH (ref 4.0–10.5)
nRBC: 0 % (ref 0.0–0.2)

## 2020-01-24 LAB — GLUCOSE, CAPILLARY
Glucose-Capillary: 103 mg/dL — ABNORMAL HIGH (ref 70–99)
Glucose-Capillary: 110 mg/dL — ABNORMAL HIGH (ref 70–99)
Glucose-Capillary: 104 mg/dL — ABNORMAL HIGH (ref 70–99)
Glucose-Capillary: 68 mg/dL — ABNORMAL LOW (ref 70–99)

## 2020-01-24 LAB — COMPREHENSIVE METABOLIC PANEL
ALT: 19 U/L (ref 0–44)
AST: 55 U/L — ABNORMAL HIGH (ref 15–41)
Albumin: 2.4 g/dL — ABNORMAL LOW (ref 3.5–5.0)
Alkaline Phosphatase: 57 U/L (ref 38–126)
Anion gap: 12 (ref 5–15)
BUN: 48 mg/dL — ABNORMAL HIGH (ref 8–23)
CO2: 24 mmol/L (ref 22–32)
Calcium: 9.1 mg/dL (ref 8.9–10.3)
Chloride: 107 mmol/L (ref 98–111)
Creatinine, Ser: 3.87 mg/dL — ABNORMAL HIGH (ref 0.44–1.00)
GFR, Estimated: 11 mL/min — ABNORMAL LOW (ref >60)
Glucose, Bld: 136 mg/dL — ABNORMAL HIGH (ref 70–99)
Potassium: 3.6 mmol/L (ref 3.5–5.1)
Sodium: 143 mmol/L (ref 135–145)
Total Bilirubin: 0.7 mg/dL (ref 0.3–1.2)
Total Protein: 5.8 g/dL — ABNORMAL LOW (ref 6.5–8.1)

## 2020-01-24 LAB — LACTIC ACID, PLASMA
Lactic Acid, Venous: 2.7 mmol/L (ref 0.5–1.9)
Lactic Acid, Venous: 3.1 mmol/L (ref 0.5–1.9)

## 2020-01-24 LAB — PROTIME-INR
INR: 3 — ABNORMAL HIGH (ref 0.8–1.2)
Prothrombin Time: 30.2 seconds — ABNORMAL HIGH (ref 11.4–15.2)

## 2020-01-24 LAB — PROCALCITONIN
Procalcitonin: 136.62 ng/mL
Procalcitonin: 109.53 ng/mL

## 2020-01-24 MED ORDER — SODIUM CHLORIDE 0.9 % IV SOLN: INTRAVENOUS | Status: DC

## 2020-01-24 NOTE — Progress Notes (Addendum)
Urology Inpatient Progress Note  Subjective: No acute events overnight.  She is afebrile, VSS. Creatinine stable, 3.87.  WBC count up, 15.2. Blood cultures positive for E. coli, urine culture pending.  On antibiotics as below. Foley catheter in place draining clear, yellow urine. Patient nonverbal this morning and unable to contribute to HPI.  Anti-infectives: Anti-infectives (From admission, onward)   Start     Dose/Rate Route Frequency Ordered Stop   01/23/20 1400  cefTRIAXone (ROCEPHIN) 2 g in sodium chloride 0.9 % 100 mL IVPB        2 g 200 mL/hr over 30 Minutes Intravenous Every 24 hours 01/23/20 1015     01/23/20 0600  piperacillin-tazobactam (ZOSYN) IVPB 2.25 g  Status:  Discontinued        2.25 g 100 mL/hr over 30 Minutes Intravenous Every 8 hours 01/23/20 0203 01/23/20 1015   01/23/20 0115  ceFEPIme (MAXIPIME) 2 g in sodium chloride 0.9 % 100 mL IVPB        2 g 200 mL/hr over 30 Minutes Intravenous  Once 01/23/20 0109 01/23/20 0217      Current Facility-Administered Medications  Medication Dose Route Frequency Provider Last Rate Last Admin  . 0.9 %  sodium chloride infusion   Intravenous PRN Flora Lipps, MD 10 mL/hr at 01/23/20 1338 250 mL at 01/23/20 1338  . acetaminophen (TYLENOL) tablet 650 mg  650 mg Oral Q4H PRN Darel Hong D, NP      . cefTRIAXone (ROCEPHIN) 2 g in sodium chloride 0.9 % 100 mL IVPB  2 g Intravenous Q24H Flora Lipps, MD 200 mL/hr at 01/23/20 1347 2 g at 01/23/20 1347  . Chlorhexidine Gluconate Cloth 2 % PADS 6 each  6 each Topical Q0600 Bradly Bienenstock, NP   6 each at 01/24/20 0617  . dextrose 5 % in lactated ringers infusion   Intravenous Continuous Flora Lipps, MD 75 mL/hr at 01/24/20 0458 New Bag at 01/24/20 0458  . docusate sodium (COLACE) capsule 100 mg  100 mg Oral BID PRN Darel Hong D, NP      . heparin injection 5,000 Units  5,000 Units Subcutaneous Q8H Flora Lipps, MD   5,000 Units at 01/24/20 0502  . insulin aspart (novoLOG)  injection 0-15 Units  0-15 Units Subcutaneous TID WC Flora Lipps, MD      . lactated ringers infusion   Intravenous Continuous Bradly Bienenstock, NP   Stopped at 01/23/20 1114  . ondansetron (ZOFRAN) injection 4 mg  4 mg Intravenous Q6H PRN Darel Hong D, NP      . polyethylene glycol (MIRALAX / GLYCOLAX) packet 17 g  17 g Oral Daily PRN Darel Hong D, NP      . warfarin (COUMADIN) tablet 3 mg  3 mg Oral q1600 Flora Lipps, MD      . Warfarin - Pharmacist Dosing Inpatient   Does not apply [id number] Flora Lipps, MD       Objective: Vital signs in last 24 hours: Temp:  [98 F (36.7 C)-99.4 F (37.4 C)] 98 F (36.7 C) (01/06 0700) Pulse Rate:  [87-99] 99 (01/06 0700) Resp:  [16-28] 17 (01/06 0700) BP: (90-127)/(50-65) 127/65 (01/06 0700) SpO2:  [93 %-98 %] 98 % (01/06 0700) Weight:  [64.8 kg] 64.8 kg (01/06 0500)  Intake/Output from previous day: 01/05 0701 - 01/06 0700 In: 2434.5 [I.V.:1238.5; IV Piggyback:1196] Out: 525 [Urine:525] Intake/Output this shift: No intake/output data recorded.  Physical Exam Vitals and nursing note reviewed.  Constitutional:  General: She is awake.  HENT:     Head: Normocephalic and atraumatic.  Pulmonary:     Effort: Pulmonary effort is normal. No respiratory distress.  Skin:    General: Skin is warm and dry.  Psychiatric:        Speech: She is noncommunicative.    Lab Results:  Recent Labs    01/22/20 2059 01/24/20 0643  WBC 11.7* 15.2*  HGB 12.9 11.7*  HCT 39.5 34.3*  PLT 185 117*   BMET Recent Labs    01/23/20 0931 01/24/20 0643  NA 145 143  K 4.1 3.6  CL 109 107  CO2 19* 24  GLUCOSE 122* 136*  BUN 47* 48*  CREATININE 3.82* 3.87*  CALCIUM 10.1 9.1   PT/INR Recent Labs    01/22/20 2059 01/24/20 0643  LABPROT 22.8* 30.2*  INR 2.1* 3.0*   Assessment & Plan: 87 year old female with PMH dementia and stage IV chronic kidney disease now POD 1 from left ureteral stent placement with Dr. Bernardo Heater for management  of a left proximal ureteral calculus with urosepsis.  CT findings of left partial staghorn calculus.  Creatinine stable and white count increasing today, not unusual following ureteral stent placement.  Suspect this will begin to downtrend tomorrow assuming resolution of urinary obstruction.  Recommend continued daily BMP, CBC.  Okay to discontinue Foley catheter given that she has been fever free for 24 hours.  She was noted to have a distended bladder on CT, recommend follow-up PVR after Foley removal to ensure no urinary retention.  Ultimately, she will require definitive stone management after completion of culture appropriate antibiotics in the form of a staged ureteroscopy versus PCNL.  Due to her age and comorbidities, she would require referral to a tertiary care center for PCNL.  Recommend outpatient follow-up in our clinic with Dr. Bernardo Heater to discuss treatment options.  Recommendations: -Discontinue Foley catheter with plans for follow-up bladder scan to ensure appropriate emptying -Daily BMP, CBC -Continue empiric antibiotics and follow urine cultures with plans for 14 days of culture appropriate therapy -Outpatient follow-up in clinic with Dr. Bernardo Heater to discuss definitive stone management  Debroah Loop, PA-C 01/24/2020

## 2020-01-24 NOTE — Progress Notes (Signed)
Inpatient Diabetes Program Recommendations  AACE/ADA: New Consensus Statement on Inpatient Glycemic Control  Target Ranges:  Prepandial:   less than 140 mg/dL      Peak postprandial:   less than 180 mg/dL (1-2 hours)      Critically ill patients:  140 - 180 mg/dL   Results for JENNELLE, PINKSTAFF (MRN [id number]) as of 01/24/2020 07:57  Ref. Range 01/23/2020 05:22 01/23/2020 06:40 01/23/2020 08:11 01/23/2020 16:44 01/23/2020 16:46 01/23/2020 17:25 01/23/2020 17:50 01/23/2020 22:18  Glucose-Capillary Latest Ref Range: 70 - 99 mg/dL 69 (L) 156 (H) 96 54 (L) 52 (L) 69 (L) 127 (H) 114 (H)   Review of Glycemic Control  Diabetes history: No Outpatient Diabetes medications: NA Current orders for Inpatient glycemic control: Novolog 0-15 units TID with meals  Inpatient Diabetes Program Recommendations:    Insulin: Please consider discontinuing Novolog correction scale.  NOTE: Noted patient has had several hypoglycemic episodes. No insulin given since admitted. Would recommend discontinuing Novolog correction scale.  Thanks, Barnie Alderman, RN, MSN, CDE Diabetes Coordinator Inpatient Diabetes Program [phone] (Team Pager from 8am to 5pm)

## 2020-01-24 NOTE — Progress Notes (Signed)
ID Patient Vitals for the past 24 hrs:  BP Temp Pulse Resp SpO2 Weight  01/24/20 1638 (!) 163/74 98.9 F (37.2 C) 90 - - -  01/24/20 1042 (!) 154/60 98.4 F (36.9 C) 88 18 97 % -  01/24/20 0700 127/65 98 F (36.7 C) 99 17 98 % -  01/24/20 0500 - - - - - 64.8 kg  01/24/20 0412 (!) 125/59 98.2 F (36.8 C) 99 16 96 % -  01/23/20 2333 (!) 90/50 98 F (36.7 C) 90 16 96 % -  01/23/20 1923 (!) 102/51 98 F (36.7 C) 88 17 93 % -   On examination patient is nonverbal, obtunded Does not respond to any commands Does opens her eyes on touching her left arm which is swollen around the IV line. Chest bilateral air entry   CBC Latest Ref Rng & Units 01/24/2020 01/22/2020 12/26/2019  WBC 4.0 - 10.5 K/uL 15.2(H) 11.7(H) 4.0  Hemoglobin 12.0 - 15.0 g/dL 11.7(L) 12.9 10.7(L)  Hematocrit 36.0 - 46.0 % 34.3(L) 39.5 31.9(L)  Platelets 150 - 400 K/uL 117(L) 185 203     CMP Latest Ref Rng & Units 01/24/2020 01/23/2020 01/22/2020  Glucose 70 - 99 mg/dL 136(H) 122(H) 183(H)  BUN 8 - 23 mg/dL 48(H) 47(H) 44(H)  Creatinine 0.44 - 1.00 mg/dL 3.87(H) 3.82(H) 3.48(H)  Sodium 135 - 145 mmol/L 143 145 146(H)  Potassium 3.5 - 5.1 mmol/L 3.6 4.1 3.5  Chloride 98 - 111 mmol/L 107 109 110  CO2 22 - 32 mmol/L 24 19(L) 20(L)  Calcium 8.9 - 10.3 mg/dL 9.1 10.1 10.3  Total Protein 6.5 - 8.1 g/dL 5.8(L) - 7.1  Total Bilirubin 0.3 - 1.2 mg/dL 0.7 - 0.9  Alkaline Phos 38 - 126 U/L 57 - 68  AST 15 - 41 U/L 55(H) - 41  ALT 0 - 44 U/L 19 - 16   Impression recommendation  E. coli bacteremia with complicated UTI from renal stone blocking the left pelviureteric junction causing mild hydronephrosis.  Status post stent placement. E. coli susceptible to cefazolin patient has been changed to cefazolin from ceftriaxone.  Leukocytosis due to recent stent placement Observe closely  Sepsis secondary to UTI  AKI on CKD secondary to infection obstruction. Worsening.  Seen by nephrologist.  She is not anuric. Continue  conservative management with IV fluids  Discussed the management with the care team.  Addendum Pt is on ceftriaxone and not cefazolin and e.coli susceptibility pending

## 2020-01-24 NOTE — Progress Notes (Signed)
Clinical/Bedside Swallow Evaluation Patient Details  Name: Myron Lona MRN: [id number] Date of Birth: 11-09-1929  Today's Date: 01/24/2020 Time: SLP Start Time (ACUTE ONLY): 9518 SLP Stop Time (ACUTE ONLY): 1030 SLP Time Calculation (min) (ACUTE ONLY): 32 min  Past Medical History:  Past Medical History:  Diagnosis Date   Alzheimer's disease ([place])    Atrial fibrillation ([place])    Chronic kidney disease    Stage 4   CVA (cerebrovascular accident) ([place])    DVT (deep venous thrombosis) ([place]) 2013   LLE   Hyperlipidemia    Hypertension    Pacemaker    Pulmonary embolism ([place])    Past Surgical History:  Past Surgical History:  Procedure Laterality Date   CATARACT EXTRACTION, BILATERAL     CYSTOSCOPY WITH STENT PLACEMENT Left 01/23/2020   Procedure: CYSTOSCOPY WITH STENT PLACEMENT;  Surgeon: Abbie Sons, MD;  Location: [facility] ORS;  Service: Urology;  Laterality: Left;   PACEMAKER IMPLANT  2013   HPI:  Ms. Harmoney Sienkiewicz is a 87 y.o. female with history of dementia, pulmonary embolism, hypertension, hyperlipidemia, CVA, atrial fibrillation, who was admitted to [facility] on 01/22/2020 for Ureterolithiasis, s/p cystoscopy and left ureteral stent placement. Head CT revealed no acute intracranial hemorrhage or infarct, advanced senescent changes and borderline ventricular enlargement. Chest x-ray was negative for cardiopulmonary disease.   Assessment / Plan / Recommendation Clinical Impression  Patient presents with mild cognitively-based oral dysphagia. She is alert, minimally verbal and follows simple commands rarely. Per nursing, at baseline pt is unable to self-feed. Positioned upright, pt unable to hold cup or retrieve boluses without assistance. When fed by clinician, patient best accepts straw sips of thin liquids, and small spoonfuls of puree, softened graham cracker. She does not attempt to bite solids even with max cues or assistance, but when small amount of solid is administered by  spoon, patient initiates rotary mastication. She sweeps tongue intermittently, clearing mild buccal residual. Swallow initiation appears timely, with minimal oral holding occurring with solid rarely, when distracted, however no cues required for initiation. There are no overt signs of aspiration or pharyngeal dysphagia. Airway protection appears adequate. Vital signs remained stable and breathing unlabored. Patient consumed 8 oz thin liquid, 3 oz puree, 1 softened graham cracker without overt signs of aspiration. Recommend initiating dysphagia 2 diet with thin liquids, straws OK. Advise meds crushed in puree. Pt will require full assist for feeding. Position upright, head forward, minimize environmental distractions, and feed only when alert. SLP to follow briefly for tolerance; advancement possible but unlikely given baseline cognitive deficits.    SLP Visit Diagnosis: Dysphagia, oral phase (R13.11)    Aspiration Risk  Mild aspiration risk    Diet Recommendation Dysphagia 2 (Fine chop);Thin liquid   Liquid Administration via: Cup;Straw Medication Administration: Crushed with puree Supervision: Staff to assist with self feeding;Full supervision/cueing for compensatory strategies Compensations: Slow rate;Small sips/bites;Minimize environmental distractions;Other (Comment) (monitor for pocketing) Postural Changes: Seated upright at 90 degrees    Other  Recommendations Oral Care Recommendations: Oral care BID   Follow up Recommendations  (tbd)      Frequency and Duration min 2x/week  2 weeks       Prognosis Prognosis for Safe Diet Advancement: Fair Barriers to Reach Goals: Cognitive deficits      Swallow Study   General Date of Onset: 01/22/20 HPI: Ms. Reneta Niehaus is a 88 y.o. female with history of dementia, pulmonary embolism, hypertension, hyperlipidemia, CVA, atrial fibrillation, who was admitted to [facility] on 01/22/2020 for  Ureterolithiasis, s/p cystoscopy and left ureteral stent  placement. Head CT revealed no acute intracranial hemorrhage or infarct, advanced senescent changes and borderline ventricular enlargement. Chest x-ray was negative for cardiopulmonary disease. Type of Study: Bedside Swallow Evaluation Previous Swallow Assessment: none on file; reportedly pt is a feeder at baseline Diet Prior to this Study: NPO Temperature Spikes Noted: No Respiratory Status: Other (comment) (3L 02 via [place], patient removed and has not been on it this morning) History of Recent Intubation: No Behavior/Cognition: Alert;Distractible;Requires cueing Oral Cavity Assessment: Within Functional Limits Oral Care Completed by SLP: No Oral Cavity - Dentition: Adequate natural dentition Vision: Functional for self-feeding Self-Feeding Abilities: Total assist Patient Positioning: Upright in bed Baseline Vocal Quality: Low vocal intensity (minimally verbal. responds, "mm hmm" at times) Volitional Cough: Cognitively unable to elicit Volitional Swallow: Unable to elicit    Oral/Motor/Sensory Function Overall Oral Motor/Sensory Function: Within functional limits   Ice Chips Ice chips: Not tested   Thin Liquid Thin Liquid: Within functional limits Presentation: Spoon;Cup;Straw    Nectar Thick Nectar Thick Liquid: Not tested   Honey Thick Honey Thick Liquid: Not tested   Puree Puree: Within functional limits Presentation: Spoon   Solid    Deneise Lever, MS, CCC-SLP Speech-Language Pathologist  Solid: Impaired Oral Phase Impairments: Other (comment) (impaired bolus retrieval; does not bite solids) Pharyngeal Phase Impairments: Other (comments) (none observed)      Aliene Altes 01/24/2020,10:40 AM

## 2020-01-24 NOTE — Progress Notes (Signed)
[facility]  ROUNDING NOTE   Subjective:   Moved to 1C from ICU. Nonverbal Creatinine 3.87 (3.82) UOP 584mL.   Objective:  Vital signs in last 24 hours:  Temp:  [98 F (36.7 C)-98.4 F (36.9 C)] 98.4 F (36.9 C) (01/06 1042) Pulse Rate:  [88-99] 88 (01/06 1042) Resp:  [16-18] 18 (01/06 1042) BP: (90-154)/(50-65) 154/60 (01/06 1042) SpO2:  [93 %-98 %] 97 % (01/06 1042) Weight:  [64.8 kg] 64.8 kg (01/06 0500)  Weight change: 1.296 kg Filed Weights   01/23/20 0618 01/23/20 0619 01/24/20 0500  Weight: 63.5 kg 63.5 kg 64.8 kg    Intake/Output: I/O last 3 completed shifts: In: 5120.9 [I.V.:1825; IV Piggyback:3296] Out: 525 [Urine:525]   Intake/Output this shift:  No intake/output data recorded.  Physical Exam: General: NAD,   Head: Normocephalic, atraumatic. Moist oral mucosal membranes  Eyes: Anicteric, PERRL  Neck: Supple, trachea midline  Lungs:  Clear to auscultation  Heart: Regular rate and rhythm  Abdomen:  Soft, nontender,   Extremities:  no peripheral edema.  Neurologic: noverbal  Skin: No lesions        Basic Metabolic Panel: Recent Labs  Lab 01/22/20 2059 01/23/20 0931 01/24/20 0643  NA 146* 145 143  K 3.5 4.1 3.6  CL 110 109 107  CO2 20* 19* 24  GLUCOSE 183* 122* 136*  BUN 44* 47* 48*  CREATININE 3.48* 3.82* 3.87*  CALCIUM 10.3 10.1 9.1    Liver Function Tests: Recent Labs  Lab 01/22/20 2330 01/24/20 0643  AST 41 55*  ALT 16 19  ALKPHOS 68 57  BILITOT 0.9 0.7  PROT 7.1 5.8*  ALBUMIN 3.3* 2.4*   Recent Labs  Lab 01/22/20 2330  LIPASE 69*   No results for input(s): AMMONIA in the last 168 hours.  CBC: Recent Labs  Lab 01/22/20 2059 01/24/20 0643  WBC 11.7* 15.2*  NEUTROABS  --  13.0*  HGB 12.9 11.7*  HCT 39.5 34.3*  MCV 94.5 91.2  PLT 185 117*    Cardiac Enzymes: No results for input(s): CKTOTAL, CKMB, CKMBINDEX, TROPONINI in the last 168 hours.  BNP: Invalid input(s): POCBNP  CBG: Recent Labs   Lab 01/23/20 1725 01/23/20 1750 01/23/20 2218 01/24/20 0815 01/24/20 1115  GLUCAP 69* 127* 114* 103* 110*    Microbiology: Results for orders placed or performed during the hospital encounter of 01/22/20  Urine culture     Status: Abnormal (Preliminary result)   Collection Time: 01/23/20 12:13 AM   Specimen: Urine, Random  Result Value Ref Range Status   Specimen Description   Final    URINE, RANDOM Performed at [facility], [address], [place], [place] [address]    Special Requests   Final    NONE Performed at [facility], [address], [place], [place] [address]    Culture (A)  Final    >=100,000 COLONIES/mL ESCHERICHIA COLI SUSCEPTIBILITIES TO FOLLOW Performed at [facility], [place] [address], [place], [place] [address]    Report Status PENDING  Incomplete  Blood Culture (routine x 2)     Status: None (Preliminary result)   Collection Time: 01/23/20 12:13 AM   Specimen: BLOOD  Result Value Ref Range Status   Specimen Description   Final    BLOOD LEFT ANTECUBITAL Performed at [facility], [address] [place], [place] [address]    Special Requests   Final    BOTTLES DRAWN AEROBIC AND ANAEROBIC Blood Culture results may not be  optimal due to an inadequate volume of blood received in culture bottles Performed at [facility], [address] [place], [place] [address]    Culture  Setup Time   Final    GRAM NEGATIVE RODS IN BOTH AEROBIC AND ANAEROBIC BOTTLES CRITICAL VALUE NOTED.  VALUE IS CONSISTENT WITH PREVIOUSLY REPORTED AND CALLED VALUE. Performed at [facility], [address], [place], [place] [address]    Culture   Final    Lonell Grandchild NEGATIVE RODS IDENTIFICATION TO FOLLOW Performed at [facility], [place] [address] [place], [place] [address]    Report Status PENDING  Incomplete  Blood Culture (routine x 2)     Status: Abnormal (Preliminary result)   Collection Time: 01/23/20 12:13 AM    Specimen: BLOOD  Result Value Ref Range Status   Specimen Description   Final    BLOOD BLOOD RIGHT FOREARM Performed at [facility], [address], [place], [place] [address]    Special Requests   Final    BOTTLES DRAWN AEROBIC AND ANAEROBIC Blood Culture results may not be optimal due to an inadequate volume of blood received in culture bottles Performed at [facility], [address], [place], [place] [address]    Culture  Setup Time   Final    GRAM NEGATIVE RODS IN BOTH AEROBIC AND ANAEROBIC BOTTLES CRITICAL RESULT CALLED TO, READ BACK BY AND VERIFIED WITH: M.SLAUGHTER,PHARMD AT 1012 ON 01/23/20 BY GM    Culture (A)  Final    ESCHERICHIA COLI SUSCEPTIBILITIES TO FOLLOW Performed at [facility], [place] [address] [place], [place] [address]    Report Status PENDING  Incomplete  SARS CORONAVIRUS 2 (TAT 6-24 HRS) Nasopharyngeal Nasopharyngeal Swab     Status: None   Collection Time: 01/23/20 12:13 AM   Specimen: Nasopharyngeal Swab  Result Value Ref Range Status   SARS Coronavirus 2 NEGATIVE NEGATIVE Final    Comment: (NOTE) SARS-CoV-2 target nucleic acids are NOT DETECTED.  The SARS-CoV-2 RNA is generally detectable in upper and lower respiratory specimens during the acute phase of infection. Negative results do not preclude SARS-CoV-2 infection, do not rule out co-infections with other pathogens, and should not be used as the sole basis for treatment or other patient management decisions. Negative results must be combined with clinical observations, patient history, and epidemiological information. The expected result is Negative.  Fact Sheet for Patients: SugarRoll.be  Fact Sheet for Healthcare Providers: https://www.woods-mathews.com/  This test is not yet approved or cleared by the [place] FDA and  has been authorized for detection and/or diagnosis of SARS-CoV-2 by FDA under an Emergency Use  Authorization (EUA). This EUA will remain  in effect (meaning this test can be used) for the duration of the COVID-19 declaration under Se ction 564(b)(1) of the Act, 21 U.S.C. section 360bbb-3(b)(1), unless the authorization is terminated or revoked sooner.  Performed at [facility], [place] [address], [place],  [address]   Blood Culture ID Panel (Reflexed)     Status: Abnormal   Collection Time: 01/23/20 12:13 AM  Result Value Ref Range Status   Enterococcus faecalis NOT DETECTED NOT DETECTED Final   Enterococcus Faecium NOT DETECTED NOT DETECTED Final   Listeria monocytogenes NOT DETECTED NOT DETECTED Final   Staphylococcus species NOT DETECTED NOT DETECTED Final   Staphylococcus aureus (BCID) NOT DETECTED NOT DETECTED Final   Staphylococcus epidermidis NOT DETECTED NOT DETECTED Final   Staphylococcus lugdunensis NOT DETECTED NOT DETECTED Final  Streptococcus species NOT DETECTED NOT DETECTED Final   Streptococcus agalactiae NOT DETECTED NOT DETECTED Final   Streptococcus pneumoniae NOT DETECTED NOT DETECTED Final   Streptococcus pyogenes NOT DETECTED NOT DETECTED Final   A.calcoaceticus-baumannii NOT DETECTED NOT DETECTED Final   Bacteroides fragilis NOT DETECTED NOT DETECTED Final   Enterobacterales DETECTED (A) NOT DETECTED Final    Comment: Enterobacterales represent a large order of gram negative bacteria, not a single organism. CRITICAL RESULT CALLED TO, READ BACK BY AND VERIFIED WITH: M.SLAUGHTER,PHARMD AT 1012 ON 01/23/20 BY GM    Enterobacter cloacae complex NOT DETECTED NOT DETECTED Final   Escherichia coli DETECTED (A) NOT DETECTED Final    Comment: CRITICAL RESULT CALLED TO, READ BACK BY AND VERIFIED WITH: M.SLAUGHTER,PHARMD AT 1012 ON 01/23/20 BY GM    Klebsiella aerogenes NOT DETECTED NOT DETECTED Final   Klebsiella oxytoca NOT DETECTED NOT DETECTED Final   Klebsiella pneumoniae NOT DETECTED NOT DETECTED Final   Proteus species NOT DETECTED NOT DETECTED  Final   Salmonella species NOT DETECTED NOT DETECTED Final   Serratia marcescens NOT DETECTED NOT DETECTED Final   Haemophilus influenzae NOT DETECTED NOT DETECTED Final   Neisseria meningitidis NOT DETECTED NOT DETECTED Final   Pseudomonas aeruginosa NOT DETECTED NOT DETECTED Final   Stenotrophomonas maltophilia NOT DETECTED NOT DETECTED Final   Candida albicans NOT DETECTED NOT DETECTED Final   Candida auris NOT DETECTED NOT DETECTED Final   Candida glabrata NOT DETECTED NOT DETECTED Final   Candida krusei NOT DETECTED NOT DETECTED Final   Candida parapsilosis NOT DETECTED NOT DETECTED Final   Candida tropicalis NOT DETECTED NOT DETECTED Final   Cryptococcus neoformans/gattii NOT DETECTED NOT DETECTED Final   CTX-M ESBL NOT DETECTED NOT DETECTED Final   Carbapenem resistance IMP NOT DETECTED NOT DETECTED Final   Carbapenem resistance KPC NOT DETECTED NOT DETECTED Final   Carbapenem resistance NDM NOT DETECTED NOT DETECTED Final   Carbapenem resist OXA 48 LIKE NOT DETECTED NOT DETECTED Final   Carbapenem resistance VIM NOT DETECTED NOT DETECTED Final    Comment: Performed at [facility], [address] [place], [place] [address]  Urine Culture     Status: Abnormal   Collection Time: 01/23/20  4:04 AM   Specimen: Urine, Random  Result Value Ref Range Status   Specimen Description   Final    URINE, RANDOM Performed at [facility], [address] [place], [place] [address]    Special Requests   Final    NONE Performed at [facility], [address] [place], [place] [address]    Culture MULTIPLE SPECIES PRESENT, SUGGEST RECOLLECTION (A)  Final   Report Status 01/24/2020 FINAL  Final  MRSA PCR Screening     Status: None   Collection Time: 01/23/20  5:23 AM   Specimen: Nasopharyngeal  Result Value Ref Range Status   MRSA by PCR NEGATIVE NEGATIVE Final    Comment:        The GeneXpert MRSA Assay (FDA approved for NASAL specimens only), is one  component of a comprehensive MRSA colonization surveillance program. It is not intended to diagnose MRSA infection nor to guide or monitor treatment for MRSA infections. Performed at [facility], [address] [place], [place] [address]     Coagulation Studies: Recent Labs    01/22/20 April 18, 2057 01/24/20 0643  LABPROT 22.8* 30.2*  INR 2.1* 3.0*    Urinalysis: Recent Labs    01/23/20 0013  COLORURINE YELLOW*  LABSPEC 1.014  PHURINE 5.0  GLUCOSEU NEGATIVE  HGBUR MODERATE*  BILIRUBINUR NEGATIVE  KETONESUR NEGATIVE  PROTEINUR 100*  NITRITE NEGATIVE  LEUKOCYTESUR MODERATE*      Imaging: CT ABDOMEN PELVIS WO CONTRAST  Result Date: 01/23/2020 CLINICAL DATA:  Bowel obstruction, vomiting, diarrhea EXAM: CT ABDOMEN AND PELVIS WITHOUT CONTRAST TECHNIQUE: Multidetector CT imaging of the abdomen and pelvis was performed following the standard protocol without IV contrast. COMPARISON:  10/07/2017 FINDINGS: Lower chest: There is left-sided volume loss again identified with mediastinal shift to the left. Mild global cardiomegaly. Pacemaker leads are seen within the right atrium and within the right ventricle embedded within the intraventricular septum. No pericardial effusion. Hepatobiliary: No focal liver abnormality is seen. No gallstones, gallbladder wall thickening, or biliary dilatation. Pancreas: Unremarkable Spleen: Unremarkable Adrenals/Urinary Tract: There has developed mild left hydronephrosis and perinephric stranding secondary to an obstructing 8 mm x 15 mm x 15 mm calculus within the left ureteropelvic junction. An additional 10 mm staghorn calculus is seen within the terminal lower pole calyx. Stable exophytic simple cortical cyst noted within the left kidney. The right kidney is unremarkable; no intrarenal or ureteral calculi are noted on the right and there is no hydronephrosis on the right. The bladder is unremarkable. Stomach/Bowel: Moderate sigmoid diverticulosis.  Mild diverticulosis of the cecum also noted. The stomach, small bowel, and large bowel are otherwise unremarkable. No evidence of obstruction or focal inflammation. The appendix is absent. Vascular/Lymphatic: Extensive aortoiliac atherosclerotic calcification. Extensive calcification noted within the mid and distal superior mesenteric artery. No aortic aneurysm. Inferior vena cava filter in expected position within the infrarenal cava. No pathologic adenopathy within the abdomen and pelvis. Reproductive: Large involuted densely calcified fibroid noted within the uterine fundus. The endometrium is thickened, abnormal in a patient of this age in absence of hormonal stimulation. No adnexal masses are seen. Other: Small fat containing umbilical hernia. Musculoskeletal: Degenerative changes are seen within the lumbar spine. No lytic or blastic bone lesion is seen. IMPRESSION: Interval development of a mild left hydronephrosis and perinephric stranding secondary to an obstructing 15 mm calculus within the left ureteropelvic junction. Superimposed nonobstructing lower pole nephrolithiasis. Peripheral vascular disease with extensive calcification within the a mid superior mesenteric artery. Patency of this vessel is not well characterized on this examination. Correlation for signs and symptoms of chronic mesenteric ischemia may be helpful. Thickening of the endometrium, abnormal in a patient of this age in absence of hormonal stimulation. Dedicated sonography and possible endometrial biopsy may be helpful for further management. Aortic Atherosclerosis (ICD10-I70.0). Electronically Signed   By: Fidela Salisbury MD   On: 01/23/2020 01:14   DG Chest 2 View  Result Date: 01/22/2020 CLINICAL DATA:  Weakness. EXAM: CHEST - 2 VIEW COMPARISON:  December 23, 2019 FINDINGS: The heart size and mediastinal contours are within normal limits. Both lungs are clear. The visualized skeletal structures are unremarkable. There is a  well-positioned dual chamber left-sided pacemaker in place. IMPRESSION: No active cardiopulmonary disease. Electronically Signed   By: Constance Holster M.D.   On: 01/22/2020 21:52   CT Head Wo Contrast  Result Date: 01/23/2020 CLINICAL DATA:  Recurrent syncope, vomiting, diarrhea EXAM: CT HEAD WITHOUT CONTRAST TECHNIQUE: Contiguous axial images were obtained from the base of the skull through the vertex without intravenous contrast. COMPARISON:  09/19/2018 FINDINGS: Brain: Normal anatomic configuration. Parenchymal volume loss is commensurate with the patient's age. Moderate subcortical and periventricular white matter changes are present likely reflecting the sequela of small vessel ischemia. Remote lacunar infarct noted  within the anterior limb of the right internal capsule. No abnormal intra or extra-axial mass lesion or fluid collection. No abnormal mass effect or midline shift. No evidence of acute intracranial hemorrhage or infarct. Borderline enlargement of the lateral ventricles likely reflects ex vacuo dilation secondary to central atrophy. Cerebellum unremarkable. Vascular: No asymmetric hyperdense vasculature at the skull base. Extensive vascular calcifications are noted within the carotid siphons bilaterally. Skull: Intact Sinuses/Orbits: Paranasal sinuses are clear. Orbits are unremarkable. Other: There is opacification of several inferior left mastoid air cells without associated cortical erosion. Remaining mastoid air cells and middle ear cavities bilaterally are clear. IMPRESSION: No acute intracranial hemorrhage or infarct. Advanced senescent changes. Borderline ventricular enlargement. Electronically Signed   By: Fidela Salisbury MD   On: 01/23/2020 01:02   DG OR UROLOGY CYSTO IMAGE ([place])  Result Date: 01/23/2020 There is no interpretation for this exam.  This order is for images obtained during a surgical procedure.  Please See "Surgeries" Tab for more information regarding the  procedure.     Medications:   . sodium chloride 250 mL (01/23/20 1338)  . cefTRIAXone (ROCEPHIN)  IV 2 g (01/24/20 1256)  . dextrose 5% lactated ringers 75 mL/hr at 01/24/20 0458  . lactated ringers Stopped (01/23/20 1114)   . Chlorhexidine Gluconate Cloth  6 each Topical Q0600  . heparin injection (subcutaneous)  5,000 Units Subcutaneous Q8H  . insulin aspart  0-15 Units Subcutaneous TID WC  . Warfarin - Pharmacist Dosing Inpatient   Does not apply q1600   sodium chloride, acetaminophen, docusate sodium, ondansetron (ZOFRAN) IV, polyethylene glycol  Assessment/ Plan:  Ms. Alexxus Sobh is a 87 y.o. black female with dementia, pulmonary embolism, hypertension, hyperlipidemia, CVA, atrial fibrillation, who was admitted to [facility] on 01/22/2020 for Ureterolithiasis [N20.1] Delirium [R41.0] Septic shock ([place]) [A41.9, R65.21] Sepsis ([place]) [A41.9] Urinary tract infection without hematuria, site unspecified [N39.0] Hydronephrosis with ureteropelvic junction (UPJ) obstruction [Q62.11] Dementia without behavioral disturbance, unspecified dementia type ([place]) [F03.90] Acute renal failure superimposed on chronic kidney disease, unspecified CKD stage, unspecified acute renal failure type ([place]) [N17.9, N18.9]  Patient underwent cystoscopy and left ureteral stent placed on admission.   1. Acute kidney injury on chronic kidney disease stage IV: baseline creatinine of 1.95, GFR of 24 on 12/26/19.  Acute kidney injury from obstructive uropathy.  Patient with anuric urine output.  No indication for dialysis. Not a candidate for long term dialysis.  - Continue IV fluids: D5-Lactated Ringer at 22mL/hr  2. Urinary tract infection: with E. Coli on blood cultures.  - empiric ceftriaxone.    LOS: 1 Aamya Orellana 1/6/20222:36 PM

## 2020-01-24 NOTE — Plan of Care (Signed)

## 2020-01-24 NOTE — Progress Notes (Signed)
PROGRESS NOTE    Hailey Lopez  HER:[id number] DOB: December 03, 1929 DOA: 01/22/2020 PCP: Marda Stalker, PA-C   Brief Narrative:  Patient is 87 year old female with past medical history of A. fib-on Coumadin, CKD stage IV, hypertension, hyperlipidemia, CVA, Alzheimer's dementia presents to emergency department with vomiting, generalized weakness, one episode of diarrhea and syncope.  ED course: Patient afebrile, v BP: 145/108, pulse: 136, EKG shows sinus tachycardia and nonspecific ST-T wave changes.  Sodium 146, bicarb 20, anion gap 16, BUN 44, creatinine: 3.45, lactic acid 8.1, procalcitonin 80.51, WBC 11.7, INR: 2.1.  UA is consistent with UTI.  Chest x-ray negative for acute findings.  CT head is negative.  CT abdomen and pelvis showed mild left hydronephrosis with perinephric stranding due to obstructing 15 mm calculus in the left Vitro pelvic junction.  She met sepsis criteria therefore she was given 2 L of IV fluid bolus and started on cefepime.  Urology was consulted.  She tested negative for COVID-19.  Patient admitted under PCCM.  [facility] took over care from 01/23/2018  Assessment & Plan:   Severe sepsis in the setting of obstructive uropathy, E. coli/Enterobacterales bacteremia: -Status post left ureteral stent placement by urology.  POD 1. -Urology consulted-recommend to discontinue Foley catheter with plan for follow-up bladder scan to ensure appropriate emptying. -Follow-up with urology outpatient to discuss definitive stone management. -Patient is afebrile.  Leukocytosis is worsening.  Procalcitonin level worsened from 80.51-136. -Continue IV Rocephin and IV fluids.,trend Procalcitonin level.  Monitor fever curve.  Monitor vitals closely. -Appreciate ID and urology recommendations.  AKI on CKD stage IV: -Baseline creatinine: 1.95.  Creatinine: 3.87, GFR: 11. -In the setting of obstructive uropathy. -Nephrology consulted-no indication for dialysis.  Not a candidate for  long-term dialysis at this time.  Continue IV fluids.  Monitor kidney function.  Elevated troponin: -Troponin 214 trended up to 764-819.  EKG: No acute ST-T wave changes noted.  Likely in the setting of demand ischemia in the setting of sepsis -Continue to monitor.  Patient denies any ACS symptoms.  History of PE and DVT: Continue Coumadin as per pharmacy.  Hyperlipidemia: Hold atorvastatin  Alzheimer's dementia: Hold donepezil  Hypertension: Blood pressure is on lower side.  Continue IV fluids.  Continue to hold Isodril at this time.  Overall poor prognosis.  Patient's CODE STATUS changed from full code to DNR on admission.  DVT prophylaxis: Coumadin Code Status: DNR Family Communication:  None present at bedside.  Plan of care discussed with patient in length and she verbalized understanding and agreed with it. Disposition Plan: To be determined  Consultants:   PCCM  ID  Nephrology  Neurology  Procedures:   Left ureteral stent placement  Antimicrobials:   Rocephin  Status is: Inpatient   Dispo: The patient is from: Home              Anticipated d/c is to: SNF              Anticipated d/c date is: 3 days              Patient currently is not medically stable to d/c.    Subjective: Patient seen and examined.  Alert and awake, following commands sometimes.  No acute events overnight.  Remained afebrile.  Denies any complaints.  Objective: Vitals:   01/24/20 0412 01/24/20 0500 01/24/20 0700 01/24/20 1042  BP: (!) 125/59  127/65 (!) 154/60  Pulse: 99  99 88  Resp: '16  17 18  ' Temp: 98.2 F (  36.8 C)  98 F (36.7 C) 98.4 F (36.9 C)  TempSrc:      SpO2: 96%  98% 97%  Weight:  64.8 kg    Height:        Intake/Output Summary (Last 24 hours) at 01/24/2020 1522 Last data filed at 01/24/2020 0500 Gross per 24 hour  Intake 912.01 ml  Output 450 ml  Net 462.01 ml   Filed Weights   01/23/20 0618 01/23/20 0619 01/24/20 0500  Weight: 63.5 kg 63.5 kg 64.8 kg     Examination:  General exam: Appears calm and comfortable, on room air, appears weak and lethargic,  elderly looking Respiratory system: Clear to auscultation. Respiratory effort normal. Cardiovascular system: S1 & S2 heard, RRR. No JVD, murmurs, rubs, gallops or clicks. No pedal edema. Gastrointestinal system: Abdomen is nondistended, soft and nontender. No organomegaly or masses felt. Normal bowel sounds heard.  Has Foley. Central nervous system: Alert and following commands intermittently. Extremities: Symmetric 5 x 5 power. Skin: No rashes, lesions or ulcers   Data Reviewed: I have personally reviewed following labs and imaging studies  CBC: Recent Labs  Lab 01/22/20 2059 01/24/20 0643  WBC 11.7* 15.2*  NEUTROABS  --  13.0*  HGB 12.9 11.7*  HCT 39.5 34.3*  MCV 94.5 91.2  PLT 185 825*   Basic Metabolic Panel: Recent Labs  Lab 01/22/20 2059 01/23/20 0931 01/24/20 0643  NA 146* 145 143  K 3.5 4.1 3.6  CL 110 109 107  CO2 20* 19* 24  GLUCOSE 183* 122* 136*  BUN 44* 47* 48*  CREATININE 3.48* 3.82* 3.87*  CALCIUM 10.3 10.1 9.1   GFR: Estimated Creatinine Clearance: 8.7 mL/min (A) (by C-G formula based on SCr of 3.87 mg/dL (H)). Liver Function Tests: Recent Labs  Lab 01/22/20 2330 01/24/20 0643  AST 41 55*  ALT 16 19  ALKPHOS 68 57  BILITOT 0.9 0.7  PROT 7.1 5.8*  ALBUMIN 3.3* 2.4*   Recent Labs  Lab 01/22/20 2330  LIPASE 69*   No results for input(s): AMMONIA in the last 168 hours. Coagulation Profile: Recent Labs  Lab 01/22/20 2059 01/24/20 0643  INR 2.1* 3.0*   Cardiac Enzymes: No results for input(s): CKTOTAL, CKMB, CKMBINDEX, TROPONINI in the last 168 hours. BNP (last 3 results) No results for input(s): PROBNP in the last 8760 hours. HbA1C: No results for input(s): HGBA1C in the last 72 hours. CBG: Recent Labs  Lab 01/23/20 1725 01/23/20 1750 01/23/20 2218 01/24/20 0815 01/24/20 1115  GLUCAP 69* 127* 114* 103* 110*   Lipid  Profile: No results for input(s): CHOL, HDL, LDLCALC, TRIG, CHOLHDL, LDLDIRECT in the last 72 hours. Thyroid Function Tests: No results for input(s): TSH, T4TOTAL, FREET4, T3FREE, THYROIDAB in the last 72 hours. Anemia Panel: No results for input(s): VITAMINB12, FOLATE, FERRITIN, TIBC, IRON, RETICCTPCT in the last 72 hours. Sepsis Labs: Recent Labs  Lab 01/22/20 2059 01/23/20 0013 01/23/20 0200 01/23/20 0707 01/23/20 0931 01/24/20 0643  PROCALCITON 80.51  --   --   --   --  136.62  LATICACIDVEN  --  8.1* 3.3* 7.9* 7.5*  --     Recent Results (from the past 240 hour(s))  Urine culture     Status: Abnormal (Preliminary result)   Collection Time: 01/23/20 12:13 AM   Specimen: Urine, Random  Result Value Ref Range Status   Specimen Description   Final    URINE, RANDOM Performed at [facility], [address] [place], [place] [address]  Special Requests   Final    NONE Performed at [facility], [address] [place], [place] [address]    Culture (A)  Final    >=100,000 COLONIES/mL ESCHERICHIA COLI SUSCEPTIBILITIES TO FOLLOW Performed at [facility], [place] [address], [place], [place] [address]    Report Status PENDING  Incomplete  Blood Culture (routine x 2)     Status: None (Preliminary result)   Collection Time: 01/23/20 12:13 AM   Specimen: BLOOD  Result Value Ref Range Status   Specimen Description   Final    BLOOD LEFT ANTECUBITAL Performed at [facility], [address] [place], [place] [address]    Special Requests   Final    BOTTLES DRAWN AEROBIC AND ANAEROBIC Blood Culture results may not be optimal due to an inadequate volume of blood received in culture bottles Performed at [facility], [address] [place], [place] [address]    Culture  Setup Time   Final    GRAM NEGATIVE RODS IN BOTH AEROBIC AND ANAEROBIC BOTTLES CRITICAL VALUE NOTED.  VALUE IS CONSISTENT WITH PREVIOUSLY REPORTED AND CALLED  VALUE. Performed at [facility], [address], [place], [place] [address]    Culture   Final    Lonell Grandchild NEGATIVE RODS IDENTIFICATION TO FOLLOW Performed at [facility], [place] [address], [place], [place] [address]    Report Status PENDING  Incomplete  Blood Culture (routine x 2)     Status: Abnormal (Preliminary result)   Collection Time: 01/23/20 12:13 AM   Specimen: BLOOD  Result Value Ref Range Status   Specimen Description   Final    BLOOD BLOOD RIGHT FOREARM Performed at [facility], [address] [place], [place] [address]    Special Requests   Final    BOTTLES DRAWN AEROBIC AND ANAEROBIC Blood Culture results may not be optimal due to an inadequate volume of blood received in culture bottles Performed at [facility], [address] [place], [place] [address]    Culture  Setup Time   Final    GRAM NEGATIVE RODS IN BOTH AEROBIC AND ANAEROBIC BOTTLES CRITICAL RESULT CALLED TO, READ BACK BY AND VERIFIED WITH: M.SLAUGHTER,PHARMD AT 1012 ON 01/23/20 BY GM    Culture (A)  Final    ESCHERICHIA COLI SUSCEPTIBILITIES TO FOLLOW Performed at [facility], [place] [address] [place], [place] [address]    Report Status PENDING  Incomplete  SARS CORONAVIRUS 2 (TAT 6-24 HRS) Nasopharyngeal Nasopharyngeal Swab     Status: None   Collection Time: 01/23/20 12:13 AM   Specimen: Nasopharyngeal Swab  Result Value Ref Range Status   SARS Coronavirus 2 NEGATIVE NEGATIVE Final    Comment: (NOTE) SARS-CoV-2 target nucleic acids are NOT DETECTED.  The SARS-CoV-2 RNA is generally detectable in upper and lower respiratory specimens during the acute phase of infection. Negative results do not preclude SARS-CoV-2 infection, do not rule out co-infections with other pathogens, and should not be used as the sole basis for treatment or other patient management decisions. Negative results must be combined with clinical observations, patient history, and  epidemiological information. The expected result is Negative.  Fact Sheet for Patients: SugarRoll.be  Fact Sheet for Healthcare Providers: https://www.woods-mathews.com/  This test is not yet approved or cleared by the [place] FDA and  has been authorized for detection and/or diagnosis of SARS-CoV-2 by FDA under an Emergency Use Authorization (EUA). This EUA will remain  in effect (  meaning this test can be used) for the duration of the COVID-19 declaration under Se ction 564(b)(1) of the Act, 21 U.S.C. section 360bbb-3(b)(1), unless the authorization is terminated or revoked sooner.  Performed at [facility], [place] [address] [place], [place] [address]   Blood Culture ID Panel (Reflexed)     Status: Abnormal   Collection Time: 01/23/20 12:13 AM  Result Value Ref Range Status   Enterococcus faecalis NOT DETECTED NOT DETECTED Final   Enterococcus Faecium NOT DETECTED NOT DETECTED Final   Listeria monocytogenes NOT DETECTED NOT DETECTED Final   Staphylococcus species NOT DETECTED NOT DETECTED Final   Staphylococcus aureus (BCID) NOT DETECTED NOT DETECTED Final   Staphylococcus epidermidis NOT DETECTED NOT DETECTED Final   Staphylococcus lugdunensis NOT DETECTED NOT DETECTED Final   Streptococcus species NOT DETECTED NOT DETECTED Final   Streptococcus agalactiae NOT DETECTED NOT DETECTED Final   Streptococcus pneumoniae NOT DETECTED NOT DETECTED Final   Streptococcus pyogenes NOT DETECTED NOT DETECTED Final   A.calcoaceticus-baumannii NOT DETECTED NOT DETECTED Final   Bacteroides fragilis NOT DETECTED NOT DETECTED Final   Enterobacterales DETECTED (A) NOT DETECTED Final    Comment: Enterobacterales represent a large order of gram negative bacteria, not a single organism. CRITICAL RESULT CALLED TO, READ BACK BY AND VERIFIED WITH: M.SLAUGHTER,PHARMD AT 1012 ON 01/23/20 BY GM    Enterobacter cloacae complex NOT DETECTED NOT DETECTED  Final   Escherichia coli DETECTED (A) NOT DETECTED Final    Comment: CRITICAL RESULT CALLED TO, READ BACK BY AND VERIFIED WITH: M.SLAUGHTER,PHARMD AT 1012 ON 01/23/20 BY GM    Klebsiella aerogenes NOT DETECTED NOT DETECTED Final   Klebsiella oxytoca NOT DETECTED NOT DETECTED Final   Klebsiella pneumoniae NOT DETECTED NOT DETECTED Final   Proteus species NOT DETECTED NOT DETECTED Final   Salmonella species NOT DETECTED NOT DETECTED Final   Serratia marcescens NOT DETECTED NOT DETECTED Final   Haemophilus influenzae NOT DETECTED NOT DETECTED Final   Neisseria meningitidis NOT DETECTED NOT DETECTED Final   Pseudomonas aeruginosa NOT DETECTED NOT DETECTED Final   Stenotrophomonas maltophilia NOT DETECTED NOT DETECTED Final   Candida albicans NOT DETECTED NOT DETECTED Final   Candida auris NOT DETECTED NOT DETECTED Final   Candida glabrata NOT DETECTED NOT DETECTED Final   Candida krusei NOT DETECTED NOT DETECTED Final   Candida parapsilosis NOT DETECTED NOT DETECTED Final   Candida tropicalis NOT DETECTED NOT DETECTED Final   Cryptococcus neoformans/gattii NOT DETECTED NOT DETECTED Final   CTX-M ESBL NOT DETECTED NOT DETECTED Final   Carbapenem resistance IMP NOT DETECTED NOT DETECTED Final   Carbapenem resistance KPC NOT DETECTED NOT DETECTED Final   Carbapenem resistance NDM NOT DETECTED NOT DETECTED Final   Carbapenem resist OXA 48 LIKE NOT DETECTED NOT DETECTED Final   Carbapenem resistance VIM NOT DETECTED NOT DETECTED Final    Comment: Performed at [facility], [address] [place], [place] [address]  Urine Culture     Status: Abnormal   Collection Time: 01/23/20  4:04 AM   Specimen: Urine, Random  Result Value Ref Range Status   Specimen Description   Final    URINE, RANDOM Performed at [facility], [address] [place], [place] [address]    Special Requests   Final    NONE Performed at [facility], [address] [place], [place]  [address]    Culture MULTIPLE SPECIES PRESENT, SUGGEST RECOLLECTION (A)  Final   Report Status 01/24/2020 FINAL  Final  MRSA PCR Screening     Status: None   Collection Time: 01/23/20  5:23 AM   Specimen: Nasopharyngeal  Result Value Ref Range Status   MRSA by PCR NEGATIVE NEGATIVE Final    Comment:        The GeneXpert MRSA Assay (FDA approved for NASAL specimens only), is one component of a comprehensive MRSA colonization surveillance program. It is not intended to diagnose MRSA infection nor to guide or monitor treatment for MRSA infections. Performed at [facility], [address] [place], [place] [address]       Radiology Studies: CT ABDOMEN PELVIS WO CONTRAST  Result Date: 01/23/2020 CLINICAL DATA:  Bowel obstruction, vomiting, diarrhea EXAM: CT ABDOMEN AND PELVIS WITHOUT CONTRAST TECHNIQUE: Multidetector CT imaging of the abdomen and pelvis was performed following the standard protocol without IV contrast. COMPARISON:  10/07/2017 FINDINGS: Lower chest: There is left-sided volume loss again identified with mediastinal shift to the left. Mild global cardiomegaly. Pacemaker leads are seen within the right atrium and within the right ventricle embedded within the intraventricular septum. No pericardial effusion. Hepatobiliary: No focal liver abnormality is seen. No gallstones, gallbladder wall thickening, or biliary dilatation. Pancreas: Unremarkable Spleen: Unremarkable Adrenals/Urinary Tract: There has developed mild left hydronephrosis and perinephric stranding secondary to an obstructing 8 mm x 15 mm x 15 mm calculus within the left ureteropelvic junction. An additional 10 mm staghorn calculus is seen within the terminal lower pole calyx. Stable exophytic simple cortical cyst noted within the left kidney. The right kidney is unremarkable; no intrarenal or ureteral calculi are noted on the right and there is no hydronephrosis on the right. The bladder is unremarkable.  Stomach/Bowel: Moderate sigmoid diverticulosis. Mild diverticulosis of the cecum also noted. The stomach, small bowel, and large bowel are otherwise unremarkable. No evidence of obstruction or focal inflammation. The appendix is absent. Vascular/Lymphatic: Extensive aortoiliac atherosclerotic calcification. Extensive calcification noted within the mid and distal superior mesenteric artery. No aortic aneurysm. Inferior vena cava filter in expected position within the infrarenal cava. No pathologic adenopathy within the abdomen and pelvis. Reproductive: Large involuted densely calcified fibroid noted within the uterine fundus. The endometrium is thickened, abnormal in a patient of this age in absence of hormonal stimulation. No adnexal masses are seen. Other: Small fat containing umbilical hernia. Musculoskeletal: Degenerative changes are seen within the lumbar spine. No lytic or blastic bone lesion is seen. IMPRESSION: Interval development of a mild left hydronephrosis and perinephric stranding secondary to an obstructing 15 mm calculus within the left ureteropelvic junction. Superimposed nonobstructing lower pole nephrolithiasis. Peripheral vascular disease with extensive calcification within the a mid superior mesenteric artery. Patency of this vessel is not well characterized on this examination. Correlation for signs and symptoms of chronic mesenteric ischemia may be helpful. Thickening of the endometrium, abnormal in a patient of this age in absence of hormonal stimulation. Dedicated sonography and possible endometrial biopsy may be helpful for further management. Aortic Atherosclerosis (ICD10-I70.0). Electronically Signed   By: Fidela Salisbury MD   On: 01/23/2020 01:14   DG Chest 2 View  Result Date: 01/22/2020 CLINICAL DATA:  Weakness. EXAM: CHEST - 2 VIEW COMPARISON:  December 23, 2019 FINDINGS: The heart size and mediastinal contours are within normal limits. Both lungs are clear. The visualized skeletal  structures are unremarkable. There is a well-positioned dual chamber left-sided pacemaker in place. IMPRESSION: No active cardiopulmonary disease. Electronically Signed   By: Constance Holster M.D.   On: 01/22/2020 21:52  CT Head Wo Contrast  Result Date: 01/23/2020 CLINICAL DATA:  Recurrent syncope, vomiting, diarrhea EXAM: CT HEAD WITHOUT CONTRAST TECHNIQUE: Contiguous axial images were obtained from the base of the skull through the vertex without intravenous contrast. COMPARISON:  09/19/2018 FINDINGS: Brain: Normal anatomic configuration. Parenchymal volume loss is commensurate with the patient's age. Moderate subcortical and periventricular white matter changes are present likely reflecting the sequela of small vessel ischemia. Remote lacunar infarct noted within the anterior limb of the right internal capsule. No abnormal intra or extra-axial mass lesion or fluid collection. No abnormal mass effect or midline shift. No evidence of acute intracranial hemorrhage or infarct. Borderline enlargement of the lateral ventricles likely reflects ex vacuo dilation secondary to central atrophy. Cerebellum unremarkable. Vascular: No asymmetric hyperdense vasculature at the skull base. Extensive vascular calcifications are noted within the carotid siphons bilaterally. Skull: Intact Sinuses/Orbits: Paranasal sinuses are clear. Orbits are unremarkable. Other: There is opacification of several inferior left mastoid air cells without associated cortical erosion. Remaining mastoid air cells and middle ear cavities bilaterally are clear. IMPRESSION: No acute intracranial hemorrhage or infarct. Advanced senescent changes. Borderline ventricular enlargement. Electronically Signed   By: Fidela Salisbury MD   On: 01/23/2020 01:02   DG OR UROLOGY CYSTO IMAGE ([place])  Result Date: 01/23/2020 There is no interpretation for this exam.  This order is for images obtained during a surgical procedure.  Please See "Surgeries" Tab  for more information regarding the procedure.    Scheduled Meds: . Chlorhexidine Gluconate Cloth  6 each Topical Q0600  . heparin injection (subcutaneous)  5,000 Units Subcutaneous Q8H  . insulin aspart  0-15 Units Subcutaneous TID WC  . Warfarin - Pharmacist Dosing Inpatient   Does not apply q1600   Continuous Infusions: . sodium chloride 250 mL (01/23/20 1338)  . cefTRIAXone (ROCEPHIN)  IV 2 g (01/24/20 1256)  . dextrose 5% lactated ringers 75 mL/hr at 01/24/20 0458  . lactated ringers Stopped (01/23/20 1114)     LOS: 1 day   Time spent: 35 minutes  Cierah Crader Loann Quill, MD [facility]  If 7PM-7AM, please contact night-coverage [email or web address] 01/24/2020, 3:22 PM

## 2020-01-24 NOTE — Progress Notes (Signed)
[place] for warfarin Indication: atrial fibrillation  No Known Allergies  Patient Measurements: Height: 5\' 4"  (162.6 cm) Weight: 64.8 kg (142 lb 13.7 oz) IBW/kg (Calculated) : 54.7  Vital Signs: Temp: 98 F (36.7 C) (01/06 0700) BP: 127/65 (01/06 0700) Pulse Rate: 99 (01/06 0700)  Labs: Recent Labs    01/22/20 2059 01/22/20 2330 01/23/20 0707 01/23/20 0931 01/24/20 0643  HGB 12.9  --   --   --  11.7*  HCT 39.5  --   --   --  34.3*  PLT 185  --   --   --  117*  APTT 34  --   --   --   --   LABPROT 22.8*  --   --   --  30.2*  INR 2.1*  --   --   --  3.0*  CREATININE 3.48*  --   --  3.82* 3.87*  TROPONINIHS 61* 214* 764* 819*  --     Estimated Creatinine Clearance: 8.7 mL/min (A) (by C-G formula based on SCr of 3.87 mg/dL (H)).   Medical History: Past Medical History:  Diagnosis Date  . Alzheimer's disease ([place])   . Atrial fibrillation ([place])   . Chronic kidney disease    Stage 4  . CVA (cerebrovascular accident) ([place])   . DVT (deep venous thrombosis) ([place]) 2013   LLE  . Hyperlipidemia   . Hypertension   . Pacemaker   . Pulmonary embolism [facility])      Assessment: 87 year old female presented with vomiting and diarrhea. Found to have left proximal ureteral calculus with obstruction, now s/p left ureteral stent placement. Blood cultures with 4/4 bottles E.coli, likely urinary source. Patient with h/o afib on warfarin PTA. Last known home dose warfarin 3 mg daily. INR therapeutic on admission.  Drug-drug interactions: Ceftriaxone Albumin: 3.3 >> 2.4  Date INR Dose 1/4 2.1 3 mg (patient reported PTA) 1/5 -- Held 1/6 3.0 Held  Goal of Therapy:  INR 2-3 Monitor platelets by anticoagulation protocol: Yes   Plan:  --INR 3, technically therapeutic. Last patient reported dose was 1/4. INR trending up despite missed dose 1/5. Suspect increased warfarin sensitivity in the setting of poor PO intake, antibiotics, decreased  protein binding, and sepsis. INR may not accurately reflect coagulability. Patient remains NPO. Will hold warfarin again tonight. Patient continues on SQ heparin for DVT prophylaxis which is reasonable if no signs/symptoms of bleeding --Continue to monitor INR daily per protocol --Continue to monitor clinical status and ability to take PO. May have to consider alternative route of anticoagulation if prolonged NPO status  Benita Gutter 01/24/2020,9:55 AM

## 2020-01-25 DIAGNOSIS — R6520 Severe sepsis without septic shock: Secondary | ICD-10-CM | POA: Diagnosis not present

## 2020-01-25 DIAGNOSIS — N171 Acute kidney failure with acute cortical necrosis: Secondary | ICD-10-CM | POA: Diagnosis not present

## 2020-01-25 DIAGNOSIS — A4151 Sepsis due to Escherichia coli [E. coli]: Secondary | ICD-10-CM | POA: Diagnosis not present

## 2020-01-25 DIAGNOSIS — F0390 Unspecified dementia without behavioral disturbance: Secondary | ICD-10-CM | POA: Diagnosis not present

## 2020-01-25 DIAGNOSIS — D72829 Elevated white blood cell count, unspecified: Secondary | ICD-10-CM | POA: Diagnosis not present

## 2020-01-25 DIAGNOSIS — N179 Acute kidney failure, unspecified: Secondary | ICD-10-CM

## 2020-01-25 DIAGNOSIS — N390 Urinary tract infection, site not specified: Secondary | ICD-10-CM | POA: Diagnosis not present

## 2020-01-25 DIAGNOSIS — Z960 Presence of urogenital implants: Secondary | ICD-10-CM

## 2020-01-25 DIAGNOSIS — N184 Chronic kidney disease, stage 4 (severe): Secondary | ICD-10-CM

## 2020-01-25 LAB — COMPREHENSIVE METABOLIC PANEL
ALT: 19 U/L (ref 0–44)
AST: 44 U/L — ABNORMAL HIGH (ref 15–41)
Albumin: 2.2 g/dL — ABNORMAL LOW (ref 3.5–5.0)
Alkaline Phosphatase: 61 U/L (ref 38–126)
Anion gap: 8 (ref 5–15)
BUN: 42 mg/dL — ABNORMAL HIGH (ref 8–23)
CO2: 24 mmol/L (ref 22–32)
Calcium: 9.2 mg/dL (ref 8.9–10.3)
Chloride: 113 mmol/L — ABNORMAL HIGH (ref 98–111)
Creatinine, Ser: 3.22 mg/dL — ABNORMAL HIGH (ref 0.44–1.00)
GFR, Estimated: 13 mL/min — ABNORMAL LOW (ref >60)
Glucose, Bld: 171 mg/dL — ABNORMAL HIGH (ref 70–99)
Potassium: 3.5 mmol/L (ref 3.5–5.1)
Sodium: 145 mmol/L (ref 135–145)
Total Bilirubin: 0.6 mg/dL (ref 0.3–1.2)
Total Protein: 5.7 g/dL — ABNORMAL LOW (ref 6.5–8.1)

## 2020-01-25 LAB — CULTURE, BLOOD (ROUTINE X 2)

## 2020-01-25 LAB — CBC WITH DIFFERENTIAL/PLATELET
Abs Immature Granulocytes: 0.11 10*3/uL — ABNORMAL HIGH (ref 0.00–0.07)
Basophils Absolute: 0.1 10*3/uL (ref 0.0–0.1)
Basophils Relative: 0 %
Eosinophils Absolute: 0.2 10*3/uL (ref 0.0–0.5)
Eosinophils Relative: 2 %
HCT: 32 % — ABNORMAL LOW (ref 36.0–46.0)
Hemoglobin: 10.6 g/dL — ABNORMAL LOW (ref 12.0–15.0)
Immature Granulocytes: 1 %
Lymphocytes Relative: 10 %
Lymphs Abs: 1.2 10*3/uL (ref 0.7–4.0)
MCH: 30.6 pg (ref 26.0–34.0)
MCHC: 33.1 g/dL (ref 30.0–36.0)
MCV: 92.5 fL (ref 80.0–100.0)
Monocytes Absolute: 0.5 10*3/uL (ref 0.1–1.0)
Monocytes Relative: 5 %
Neutro Abs: 9.5 10*3/uL — ABNORMAL HIGH (ref 1.7–7.7)
Neutrophils Relative %: 82 %
Platelets: 120 10*3/uL — ABNORMAL LOW (ref 150–400)
RBC: 3.46 MIL/uL — ABNORMAL LOW (ref 3.87–5.11)
RDW: 14.9 % (ref 11.5–15.5)
Smear Review: NORMAL
WBC: 11.7 10*3/uL — ABNORMAL HIGH (ref 4.0–10.5)
nRBC: 0 % (ref 0.0–0.2)

## 2020-01-25 LAB — GLUCOSE, CAPILLARY
Glucose-Capillary: 94 mg/dL (ref 70–99)
Glucose-Capillary: 107 mg/dL — ABNORMAL HIGH (ref 70–99)

## 2020-01-25 LAB — LACTIC ACID, PLASMA
Lactic Acid, Venous: 1.3 mmol/L (ref 0.5–1.9)
Lactic Acid, Venous: 1.7 mmol/L (ref 0.5–1.9)

## 2020-01-25 LAB — URINE CULTURE: Culture: >=100000 — AB

## 2020-01-25 LAB — PROTIME-INR
INR: 2.6 — ABNORMAL HIGH (ref 0.8–1.2)
Prothrombin Time: 27 seconds — ABNORMAL HIGH (ref 11.4–15.2)

## 2020-01-25 LAB — PROCALCITONIN: Procalcitonin: 70.41 ng/mL

## 2020-01-25 MED ORDER — TRAZODONE HCL 50 MG PO TABS
50.0000 mg | ORAL_TABLET | Freq: Every evening | ORAL | Status: DC | PRN
  Administered 2020-01-29: 50 mg via ORAL
  Filled 2020-01-25: qty 1

## 2020-01-25 MED ORDER — CEFAZOLIN SODIUM-DEXTROSE 1-4 GM/50ML-% IV SOLN
1.0000 g | Freq: Two times a day (BID) | INTRAVENOUS | Status: AC
  Administered 2020-01-25 – 2020-01-29 (×10): 1 g via INTRAVENOUS
  Filled 2020-01-25 (×11): qty 50

## 2020-01-25 MED ORDER — WARFARIN SODIUM 3 MG PO TABS
3.0000 mg | ORAL_TABLET | Freq: Once | ORAL | Status: AC
  Administered 2020-01-25: 3 mg via ORAL
  Filled 2020-01-25 (×2): qty 1

## 2020-01-25 MED ORDER — ATORVASTATIN CALCIUM 10 MG PO TABS
10.0000 mg | ORAL_TABLET | Freq: Every day | ORAL | Status: DC
  Administered 2020-01-25 – 2020-02-05 (×11): 10 mg via ORAL
  Filled 2020-01-25 (×13): qty 1

## 2020-01-25 MED ORDER — ISOSORBIDE DINITRATE 10 MG PO TABS
5.0000 mg | ORAL_TABLET | Freq: Every day | ORAL | Status: DC
Start: 2020-01-25 — End: 2020-02-06
  Administered 2020-01-25 – 2020-02-05 (×12): 5 mg via ORAL
  Filled 2020-01-25 (×14): qty 0.5

## 2020-01-25 MED ORDER — DONEPEZIL HCL 5 MG PO TABS
5.0000 mg | ORAL_TABLET | Freq: Every day | ORAL | Status: DC
  Administered 2020-01-25 – 2020-02-03 (×9): 5 mg via ORAL
  Filled 2020-01-25 (×11): qty 1

## 2020-01-25 NOTE — Progress Notes (Addendum)
[facility] for warfarin Indication: atrial fibrillation   Labs: Recent Labs    01/22/20 2059 01/22/20 2330 01/23/20 0707 01/23/20 0931 01/24/20 0643 01/25/20 0219  HGB 12.9  --   --   --  11.7* 10.6*  HCT 39.5  --   --   --  34.3* 32.0*  PLT 185  --   --   --  117* 120*  APTT 34  --   --   --   --   --   LABPROT 22.8*  --   --   --  30.2* 27.0*  INR 2.1*  --   --   --  3.0* 2.6*  CREATININE 3.48*  --   --  3.82* 3.87* 3.22*  TROPONINIHS 61* 214* 764* 819*  --   --     Estimated Creatinine Clearance: 10.4 mL/min (A) (by C-G formula based on SCr of 3.22 mg/dL (H)).   Medical History: Past Medical History:  Diagnosis Date  . Alzheimer's disease ([place])   . Atrial fibrillation ([place])   . Chronic kidney disease    Stage 4  . CVA (cerebrovascular accident) ([place])   . DVT (deep venous thrombosis) ([place]) 2013   LLE  . Hyperlipidemia   . Hypertension   . Pacemaker   . Pulmonary embolism [facility])     Assessment: 87 year old female presented with vomiting and diarrhea. Found to have left proximal ureteral calculus with obstruction, now s/p left ureteral stent placement. Blood cultures with 4/4 bottles E.coli, likely urinary source. Patient with h/o afib on warfarin PTA. Last known home dose warfarin 3 mg daily. INR therapeutic on admission.  Drug-drug interactions: Cefazolin Albumin: 3.3 >> 2.4 >> 2.2  Date INR Dose 1/4 2.1 3 mg (patient reported PTA) 1/5 -- Held 1/6 3.0 Held 1/7 2.6 3 mg  Goal of Therapy:  INR 2-3 Monitor platelets by anticoagulation protocol: Yes   Plan:  --INR 2.6, therapeutic. Will order warfarin 3 mg x 1 today as per home regimen --Continue to monitor INR daily per protocol --Hgb trending down since admission. Mild thrombocytopenia which is overall stable. Suspect hemodilution from fluid resuscitation given no signs/symptoms of bleeding  Benita Gutter 01/25/2020,8:21 AM

## 2020-01-25 NOTE — Care Management Important Message (Signed)
Important Message  Patient Details  Name: Hailey Lopez MRN: [id number] Date of Birth: Jul 23, 1929   Medicare Important Message Given:  Yes     [name] 01/25/2020, 10:45 AM

## 2020-01-25 NOTE — Progress Notes (Signed)
   Date of Admission:  01/22/2020        Subjective: Non verbal Niece at bed side , feeding her Niece says she walks at home  Medications:  . Chlorhexidine Gluconate Cloth  6 each Topical Q0600  . heparin injection (subcutaneous)  5,000 Units Subcutaneous Q8H  . Warfarin - Pharmacist Dosing Inpatient   Does not apply q1600    Objective: Patient Vitals for the past 24 hrs:  BP Temp Pulse Resp SpO2 Weight  01/25/20 1205 (!) 160/80 98.9 F (37.2 C) 76 - - -  01/25/20 0814 127/84 97.8 F (36.6 C) 84 - 100 % -  01/25/20 0554 (!) 143/88 - 84 18 100 % -  01/25/20 0500 - - - - - 64.5 kg  01/25/20 0058 137/90 - 98 - - -  01/25/20 0056 (!) 133/109 99 F (37.2 C) 97 18 96 % -  01/24/20 2038 (!) 133/99 98.4 F (36.9 C) 97 16 98 % -   PHYSICAL EXAM:  General: awake, non verbal , comfortable  Lungs: b/l air entry Heart: Regular rate and rhythm, no murmur, rub or gallop. Abdomen: Soft,  Extremities: atraumatic, no cyanosis. No edema. No clubbing Skin:no obvious rash Left arm swelling much improved Neurologic: cannot assess  Lab Results Recent Labs    01/24/20 0643 01/25/20 0219  WBC 15.2* 11.7*  HGB 11.7* 10.6*  HCT 34.3* 32.0*  NA 143 145  K 3.6 3.5  CL 107 113*  CO2 24 24  BUN 48* 42*  CREATININE 3.87* 3.22*   Liver Panel Recent Labs    01/22/20 2330 01/24/20 0643 01/25/20 0219  PROT 7.1 5.8* 5.7*  ALBUMIN 3.3* 2.4* 2.2*  AST 41 55* 44*  ALT 16 19 19   ALKPHOS 68 57 61  BILITOT 0.9 0.7 0.6  BILIDIR 0.3*  --   --   IBILI 0.6  --   --     Assessment/Plan: E. coli bacteremia with complicated UTI from renal stone blocking the left pelvic ureteric junction causing mild hydronephrosis.  She is status post stent placement. She is doing better. No fever and leukocytosis is resolving. The ceftriaxone can be changed to cefazolin.  She will need a total of 14 days of antibiotic.  After initial  7 days of IV antibiotic( 01/29/20)  she could be switched over to p.o. to  complete a 14-day course.  Last day of therapy would be 02/06/2020. Sepsis secondary to UTI resolved  Dementia  AKI on CKD.  Discussed the management with the niece. ID will follow peripherally this weekend.  Call if needed.

## 2020-01-25 NOTE — Progress Notes (Signed)
[facility]  ROUNDING NOTE   Subjective:   Nonverbal Creatinine 3.22 (3.87) (3.82) UOP 1034mL  NS at 122mL/hr  Objective:  Vital signs in last 24 hours:  Temp:  [97.8 F (36.6 C)-99 F (37.2 C)] 97.8 F (36.6 C) (01/07 0814) Pulse Rate:  [84-98] 84 (01/07 0814) Resp:  [16-18] 18 (01/07 0554) BP: (127-163)/(74-109) 127/84 (01/07 0814) SpO2:  [96 %-100 %] 100 % (01/07 0814) Weight:  [64.5 kg] 64.5 kg (01/07 0500)  Weight change: -0.3 kg Filed Weights   01/23/20 0619 01/24/20 0500 01/25/20 0500  Weight: 63.5 kg 64.8 kg 64.5 kg    Intake/Output: I/O last 3 completed shifts: In: 1069.7 [P.O.:145; I.V.:824.7; IV Piggyback:100] Out: 1450 [Urine:1450]   Intake/Output this shift:  No intake/output data recorded.  Physical Exam: General: NAD,   Head: Normocephalic, atraumatic. Moist oral mucosal membranes  Eyes: Anicteric, PERRL  Neck: Supple, trachea midline  Lungs:  Clear to auscultation  Heart: Regular rate and rhythm  Abdomen:  Soft, nontender,   Extremities:  no peripheral edema.  Neurologic: noverbal  Skin: No lesions        Basic Metabolic Panel: Recent Labs  Lab 01/22/20 2059 01/23/20 0931 01/24/20 0643 01/25/20 0219  NA 146* 145 143 145  K 3.5 4.1 3.6 3.5  CL 110 109 107 113*  CO2 20* 19* 24 24  GLUCOSE 183* 122* 136* 171*  BUN 44* 47* 48* 42*  CREATININE 3.48* 3.82* 3.87* 3.22*  CALCIUM 10.3 10.1 9.1 9.2    Liver Function Tests: Recent Labs  Lab 01/22/20 2330 01/24/20 0643 01/25/20 0219  AST 41 55* 44*  ALT 16 19 19   ALKPHOS 68 57 61  BILITOT 0.9 0.7 0.6  PROT 7.1 5.8* 5.7*  ALBUMIN 3.3* 2.4* 2.2*   Recent Labs  Lab 01/22/20 2330  LIPASE 69*   No results for input(s): AMMONIA in the last 168 hours.  CBC: Recent Labs  Lab 01/22/20 2059 01/24/20 0643 01/25/20 0219  WBC 11.7* 15.2* 11.7*  NEUTROABS  --  13.0* 9.5*  HGB 12.9 11.7* 10.6*  HCT 39.5 34.3* 32.0*  MCV 94.5 91.2 92.5  PLT 185 117* 120*     Cardiac Enzymes: No results for input(s): CKTOTAL, CKMB, CKMBINDEX, TROPONINI in the last 168 hours.  BNP: Invalid input(s): POCBNP  CBG: Recent Labs  Lab 01/24/20 0815 01/24/20 1115 01/24/20 1640 01/24/20 2002 01/25/20 0813  GLUCAP 103* 110* 104* 68* 22    Microbiology: Results for orders placed or performed during the hospital encounter of 01/22/20  Urine culture     Status: Abnormal   Collection Time: 01/23/20 12:13 AM   Specimen: Urine, Random  Result Value Ref Range Status   Specimen Description   Final    URINE, RANDOM Performed at [facility], [address] [place], [place] [address]    Special Requests   Final    NONE Performed at [facility], [address] [place], [place] [address]    Culture >=100,000 COLONIES/mL ESCHERICHIA COLI (A)  Final   Report Status 01/25/2020 FINAL  Final   Organism ID, Bacteria ESCHERICHIA COLI (A)  Final      Susceptibility   Escherichia coli - MIC*    AMPICILLIN >=32 RESISTANT Resistant     CEFAZOLIN <=4 SENSITIVE Sensitive     CEFEPIME <=0.12 SENSITIVE Sensitive     CEFTRIAXONE <=0.25 SENSITIVE Sensitive     CIPROFLOXACIN <=0.25 SENSITIVE Sensitive     GENTAMICIN <=1 SENSITIVE Sensitive  IMIPENEM <=0.25 SENSITIVE Sensitive     NITROFURANTOIN <=16 SENSITIVE Sensitive     TRIMETH/SULFA >=320 RESISTANT Resistant     AMPICILLIN/SULBACTAM 4 SENSITIVE Sensitive     PIP/TAZO <=4 SENSITIVE Sensitive     * >=100,000 COLONIES/mL ESCHERICHIA COLI  Blood Culture (routine x 2)     Status: Abnormal   Collection Time: 01/23/20 12:13 AM   Specimen: BLOOD  Result Value Ref Range Status   Specimen Description   Final    BLOOD LEFT ANTECUBITAL Performed at [facility], [address] [place], [place] [address]    Special Requests   Final    BOTTLES DRAWN AEROBIC AND ANAEROBIC Blood Culture results may not be optimal due to an inadequate volume of blood received in culture bottles Performed at  [facility], [address] [place], [place] [address]    Culture  Setup Time   Final    GRAM NEGATIVE RODS IN BOTH AEROBIC AND ANAEROBIC BOTTLES CRITICAL VALUE NOTED.  VALUE IS CONSISTENT WITH PREVIOUSLY REPORTED AND CALLED VALUE. Performed at [facility], [address] [place], [place] [address]    Culture (A)  Final    ESCHERICHIA COLI SUSCEPTIBILITIES PERFORMED ON PREVIOUS CULTURE WITHIN THE LAST 5 DAYS. Performed at [facility], [place] [address] [place], [place] [address]    Report Status 01/25/2020 FINAL  Final  Blood Culture (routine x 2)     Status: Abnormal   Collection Time: 01/23/20 12:13 AM   Specimen: BLOOD  Result Value Ref Range Status   Specimen Description   Final    BLOOD BLOOD RIGHT FOREARM Performed at [facility], [address], [place], [place] [address]    Special Requests   Final    BOTTLES DRAWN AEROBIC AND ANAEROBIC Blood Culture results may not be optimal due to an inadequate volume of blood received in culture bottles Performed at [facility], [address], [place], [place] [address]    Culture  Setup Time   Final    GRAM NEGATIVE RODS IN BOTH AEROBIC AND ANAEROBIC BOTTLES CRITICAL RESULT CALLED TO, READ BACK BY AND VERIFIED WITH: M.SLAUGHTER,PHARMD AT 1012 ON 01/23/20 BY GM Performed at [facility], [place] [address] [place], [place] [address]    Culture ESCHERICHIA COLI (A)  Final   Report Status 01/25/2020 FINAL  Final   Organism ID, Bacteria ESCHERICHIA COLI  Final      Susceptibility   Escherichia coli - MIC*    AMPICILLIN >=32 RESISTANT Resistant     CEFAZOLIN <=4 SENSITIVE Sensitive     CEFEPIME <=0.12 SENSITIVE Sensitive     CEFTAZIDIME <=1 SENSITIVE Sensitive     CEFTRIAXONE <=0.25 SENSITIVE Sensitive     CIPROFLOXACIN <=0.25 SENSITIVE Sensitive     GENTAMICIN <=1 SENSITIVE Sensitive     IMIPENEM <=0.25 SENSITIVE Sensitive     TRIMETH/SULFA >=320 RESISTANT Resistant      AMPICILLIN/SULBACTAM 4 SENSITIVE Sensitive     PIP/TAZO <=4 SENSITIVE Sensitive     * ESCHERICHIA COLI  SARS CORONAVIRUS 2 (TAT 6-24 HRS) Nasopharyngeal Nasopharyngeal Swab     Status: None   Collection Time: 01/23/20 12:13 AM   Specimen: Nasopharyngeal Swab  Result Value Ref Range Status   SARS Coronavirus 2 NEGATIVE NEGATIVE Final    Comment: (NOTE) SARS-CoV-2 target nucleic acids are NOT DETECTED.  The SARS-CoV-2 RNA is generally detectable in upper and lower respiratory specimens during the acute phase of infection. Negative results do not  preclude SARS-CoV-2 infection, do not rule out co-infections with other pathogens, and should not be used as the sole basis for treatment or other patient management decisions. Negative results must be combined with clinical observations, patient history, and epidemiological information. The expected result is Negative.  Fact Sheet for Patients: SugarRoll.be  Fact Sheet for Healthcare Providers: https://www.woods-mathews.com/  This test is not yet approved or cleared by the [place] FDA and  has been authorized for detection and/or diagnosis of SARS-CoV-2 by FDA under an Emergency Use Authorization (EUA). This EUA will remain  in effect (meaning this test can be used) for the duration of the COVID-19 declaration under Se ction 564(b)(1) of the Act, 21 U.S.C. section 360bbb-3(b)(1), unless the authorization is terminated or revoked sooner.  Performed at [facility], [place] [address] [place], [place] [address]   Blood Culture ID Panel (Reflexed)     Status: Abnormal   Collection Time: 01/23/20 12:13 AM  Result Value Ref Range Status   Enterococcus faecalis NOT DETECTED NOT DETECTED Final   Enterococcus Faecium NOT DETECTED NOT DETECTED Final   Listeria monocytogenes NOT DETECTED NOT DETECTED Final   Staphylococcus species NOT DETECTED NOT DETECTED Final   Staphylococcus aureus (BCID)  NOT DETECTED NOT DETECTED Final   Staphylococcus epidermidis NOT DETECTED NOT DETECTED Final   Staphylococcus lugdunensis NOT DETECTED NOT DETECTED Final   Streptococcus species NOT DETECTED NOT DETECTED Final   Streptococcus agalactiae NOT DETECTED NOT DETECTED Final   Streptococcus pneumoniae NOT DETECTED NOT DETECTED Final   Streptococcus pyogenes NOT DETECTED NOT DETECTED Final   A.calcoaceticus-baumannii NOT DETECTED NOT DETECTED Final   Bacteroides fragilis NOT DETECTED NOT DETECTED Final   Enterobacterales DETECTED (A) NOT DETECTED Final    Comment: Enterobacterales represent a large order of gram negative bacteria, not a single organism. CRITICAL RESULT CALLED TO, READ BACK BY AND VERIFIED WITH: M.SLAUGHTER,PHARMD AT 1012 ON 01/23/20 BY GM    Enterobacter cloacae complex NOT DETECTED NOT DETECTED Final   Escherichia coli DETECTED (A) NOT DETECTED Final    Comment: CRITICAL RESULT CALLED TO, READ BACK BY AND VERIFIED WITH: M.SLAUGHTER,PHARMD AT 1012 ON 01/23/20 BY GM    Klebsiella aerogenes NOT DETECTED NOT DETECTED Final   Klebsiella oxytoca NOT DETECTED NOT DETECTED Final   Klebsiella pneumoniae NOT DETECTED NOT DETECTED Final   Proteus species NOT DETECTED NOT DETECTED Final   Salmonella species NOT DETECTED NOT DETECTED Final   Serratia marcescens NOT DETECTED NOT DETECTED Final   Haemophilus influenzae NOT DETECTED NOT DETECTED Final   Neisseria meningitidis NOT DETECTED NOT DETECTED Final   Pseudomonas aeruginosa NOT DETECTED NOT DETECTED Final   Stenotrophomonas maltophilia NOT DETECTED NOT DETECTED Final   Candida albicans NOT DETECTED NOT DETECTED Final   Candida auris NOT DETECTED NOT DETECTED Final   Candida glabrata NOT DETECTED NOT DETECTED Final   Candida krusei NOT DETECTED NOT DETECTED Final   Candida parapsilosis NOT DETECTED NOT DETECTED Final   Candida tropicalis NOT DETECTED NOT DETECTED Final   Cryptococcus neoformans/gattii NOT DETECTED NOT DETECTED Final    CTX-M ESBL NOT DETECTED NOT DETECTED Final   Carbapenem resistance IMP NOT DETECTED NOT DETECTED Final   Carbapenem resistance KPC NOT DETECTED NOT DETECTED Final   Carbapenem resistance NDM NOT DETECTED NOT DETECTED Final   Carbapenem resist OXA 48 LIKE NOT DETECTED NOT DETECTED Final   Carbapenem resistance VIM NOT DETECTED NOT DETECTED Final    Comment: Performed at [facility], [address],  [place], [place] [address]  Urine Culture     Status: None (Preliminary result)   Collection Time: 01/23/20  4:04 AM   Specimen: Urine, Random  Result Value Ref Range Status   Specimen Description   Final    URINE, RANDOM Performed at [facility], [address], [place], [place] [address]    Special Requests   Final    NONE Performed at [facility], [address] [place], [place] [address]    Culture   Final    CULTURE REINCUBATED FOR BETTER GROWTH CORRECTED ON 01/07 AT 0809: PREVIOUSLY REPORTED AS MULTIPLE SPECIES PRESENT, SUGGEST RECOLLECTION Performed at [facility], [place] [address], [place], [place] [address]    Report Status PENDING  Incomplete  MRSA PCR Screening     Status: None   Collection Time: 01/23/20  5:23 AM   Specimen: Nasopharyngeal  Result Value Ref Range Status   MRSA by PCR NEGATIVE NEGATIVE Final    Comment:        The GeneXpert MRSA Assay (FDA approved for NASAL specimens only), is one component of a comprehensive MRSA colonization surveillance program. It is not intended to diagnose MRSA infection nor to guide or monitor treatment for MRSA infections. Performed at [facility], [address] [place],  [address]     Coagulation Studies: Recent Labs    01/22/20 04/15/57 01/24/20 0643 01/25/20 0219  LABPROT 22.8* 30.2* 27.0*  INR 2.1* 3.0* 2.6*    Urinalysis: Recent Labs    01/23/20 0013  COLORURINE YELLOW*  LABSPEC 1.014  PHURINE 5.0  GLUCOSEU NEGATIVE  HGBUR MODERATE*  BILIRUBINUR  NEGATIVE  KETONESUR NEGATIVE  PROTEINUR 100*  NITRITE NEGATIVE  LEUKOCYTESUR MODERATE*      Imaging: No results found.   Medications:   . sodium chloride 250 mL (01/23/20 1338)  . sodium chloride 100 mL/hr at 01/24/20 1845  .  ceFAZolin (ANCEF) IV     . Chlorhexidine Gluconate Cloth  6 each Topical Q0600  . heparin injection (subcutaneous)  5,000 Units Subcutaneous Q8H  . Warfarin - Pharmacist Dosing Inpatient   Does not apply q1600   sodium chloride, acetaminophen, docusate sodium, ondansetron (ZOFRAN) IV, polyethylene glycol  Assessment/ Plan:  Ms. Hailey Lopez is a 87 y.o. black female with dementia, pulmonary embolism, hypertension, hyperlipidemia, CVA, atrial fibrillation, who was admitted to [facility] on 01/22/2020 for Ureterolithiasis [N20.1] Delirium [R41.0] Septic shock ([place]) [A41.9, R65.21] Sepsis ([place]) [A41.9] Urinary tract infection without hematuria, site unspecified [N39.0] Hydronephrosis with ureteropelvic junction (UPJ) obstruction [Q62.11] Dementia without behavioral disturbance, unspecified dementia type ([place]) [F03.90] Acute renal failure superimposed on chronic kidney disease, unspecified CKD stage, unspecified acute renal failure type ([place]) [N17.9, N18.9]  Patient underwent cystoscopy and left ureteral stent placed on admission.   1. Acute kidney injury on chronic kidney disease stage IV: baseline creatinine of 1.95, GFR of 24 on 12/26/19.  Acute kidney injury from obstructive uropathy.  Patient with anuric urine output.  No indication for dialysis. Not a candidate for long term dialysis.  - Currently on IV fluids.   2. Urinary tract infection: with E. Coli on blood cultures. Obstruction and staghorn calculus.  - empiric ceftriaxone.  - Appreciate urology input.    LOS: 2 Hailey Lopez 1/7/202211:29 AM

## 2020-01-25 NOTE — Progress Notes (Addendum)
PROGRESS NOTE    Hailey Lopez  ZOX:[id number] DOB: September 09, 1929 DOA: 01/22/2020 PCP: Marda Stalker, PA-C   Brief Narrative:  Patient is 87 year old female with past medical history of A. fib-on Coumadin, CKD stage IV, hypertension, hyperlipidemia, CVA, Alzheimer's dementia presents to emergency department with vomiting, generalized weakness, one episode of diarrhea and syncope.  ED course: Patient afebrile, v BP: 145/108, pulse: 136, EKG shows sinus tachycardia and nonspecific ST-T wave changes.  Sodium 146, bicarb 20, anion gap 16, BUN 44, creatinine: 3.45, lactic acid 8.1, procalcitonin 80.51, WBC 11.7, INR: 2.1.  UA is consistent with UTI.  Chest x-ray negative for acute findings.  CT head is negative.  CT abdomen and pelvis showed mild left hydronephrosis with perinephric stranding due to obstructing 15 mm calculus in the left Vitro pelvic junction.  She met sepsis criteria therefore she was given 2 L of IV fluid bolus and started on cefepime.  Urology was consulted.  She tested negative for COVID-19.  Patient admitted under PCCM.  [facility] took over care from 01/23/2018  Assessment & Plan:   Severe sepsis in the setting of obstructive uropathy, E. coli/Enterobacterales bacteremia: -Status post left ureteral stent placement by urology.  POD 2. -Urology consulted-recommend to discontinue Foley catheter with plan for follow-up bladder scan to ensure appropriate emptying. -Follow-up with urology outpatient to discuss definitive stone management. -Patient is afebrile.  Leukocytosis is improving.  Procalcitonin level: 80.51-136-109-70.41 -Appreciate ID and urology recommendations. -Blood culture susceptibilities are back-change IV Rocephin to cefazolin.  AKI on CKD stage IV: -Baseline creatinine: 1.95.  Creatinine: 3.87, GFR: 11. -In the setting of obstructive uropathy.  Renal function improving. -Nephrology consulted-no indication for dialysis.  Not a candidate for long-term  dialysis at this time.  Continue IV fluids.  Monitor kidney function.  Elevated troponin: -Troponin 214 trended up to 764-819.  EKG: No acute ST-T wave changes noted.  Likely in the setting of demand ischemia in the setting of sepsis -Continue to monitor.  Patient denies any ACS symptoms.  History of PE and DVT: Continue Coumadin as per pharmacy.  Hyperlipidemia: Continue atorvastatin  Alzheimer's dementia: Continue donepezil  Hypertension: Discontinue IV fluids.  Patient started taking p.o.  Resume home BP meds-Isordil.  Thrombocytopenia: Platelet 120.  No signs of active bleeding.  Repeat CBC tomorrow AM.   DVT prophylaxis: Coumadin Code Status: DNR Family Communication: Patient's niece present at bedside.  Plan of care discussed with patient in length and she verbalized understanding and agreed with it. Discussed plan of care with patient's niece at the bedside and she verbalized understanding.  Disposition Plan: To be determined  Consultants:   PCCM  ID  Nephrology  Neurology  Procedures:   Left ureteral stent placement  Antimicrobials:   Rocephin  Status is: Inpatient   Dispo: The patient is from: Home              Anticipated d/c is to: SNF              Anticipated d/c date is: 3 days              Patient currently is not medically stable to d/c.    Subjective: Patient seen and examined.  Niece at bedside.  Eating breakfast.  No acute events overnight.  As per niece patient was restless last night and wondering if she needs something for anxiety/pain.  Remained afebrile.  Objective: Vitals:   01/25/20 0058 01/25/20 0500 01/25/20 0554 01/25/20 0814  BP: 137/90  (!) 143/88  127/84  Pulse: 98  84 84  Resp:   18   Temp:    97.8 F (36.6 C)  TempSrc:      SpO2:   100% 100%  Weight:  64.5 kg    Height:        Intake/Output Summary (Last 24 hours) at 01/25/2020 1152 Last data filed at 01/25/2020 0236 Gross per 24 hour  Intake 245 ml  Output 1000 ml   Net -755 ml   Filed Weights   01/23/20 0619 01/24/20 0500 01/25/20 0500  Weight: 63.5 kg 64.8 kg 64.5 kg    Examination:  General exam: Appears calm and comfortable, on room air, eating breakfast elderly looking Respiratory system: Clear to auscultation. Respiratory effort normal. Cardiovascular system: S1 & S2 heard, RRR. No JVD, murmurs, rubs, gallops or clicks. No pedal edema. Gastrointestinal system: Abdomen is nondistended, soft and nontender. No organomegaly or masses felt. Normal bowel sounds heard.  Has Foley. Central nervous system: Alert and following commands. Extremities: Symmetric 5 x 5 power. Skin: No rashes, lesions or ulcers   Data Reviewed: I have personally reviewed following labs and imaging studies  CBC: Recent Labs  Lab 01/22/20 2059 01/24/20 0643 01/25/20 0219  WBC 11.7* 15.2* 11.7*  NEUTROABS  --  13.0* 9.5*  HGB 12.9 11.7* 10.6*  HCT 39.5 34.3* 32.0*  MCV 94.5 91.2 92.5  PLT 185 117* 914*   Basic Metabolic Panel: Recent Labs  Lab 01/22/20 2059 01/23/20 0931 01/24/20 0643 01/25/20 0219  NA 146* 145 143 145  K 3.5 4.1 3.6 3.5  CL 110 109 107 113*  CO2 20* 19* 24 24  GLUCOSE 183* 122* 136* 171*  BUN 44* 47* 48* 42*  CREATININE 3.48* 3.82* 3.87* 3.22*  CALCIUM 10.3 10.1 9.1 9.2   GFR: Estimated Creatinine Clearance: 10.4 mL/min (A) (by C-G formula based on SCr of 3.22 mg/dL (H)). Liver Function Tests: Recent Labs  Lab 01/22/20 2330 01/24/20 0643 01/25/20 0219  AST 41 55* 44*  ALT '16 19 19  ' ALKPHOS 68 57 61  BILITOT 0.9 0.7 0.6  PROT 7.1 5.8* 5.7*  ALBUMIN 3.3* 2.4* 2.2*   Recent Labs  Lab 01/22/20 2330  LIPASE 69*   No results for input(s): AMMONIA in the last 168 hours. Coagulation Profile: Recent Labs  Lab 01/22/20 2059 01/24/20 0643 01/25/20 0219  INR 2.1* 3.0* 2.6*   Cardiac Enzymes: No results for input(s): CKTOTAL, CKMB, CKMBINDEX, TROPONINI in the last 168 hours. BNP (last 3 results) No results for input(s):  PROBNP in the last 8760 hours. HbA1C: No results for input(s): HGBA1C in the last 72 hours. CBG: Recent Labs  Lab 01/24/20 0815 01/24/20 1115 01/24/20 1640 01/24/20 2002 01/25/20 0813  GLUCAP 103* 110* 104* 68* 94   Lipid Profile: No results for input(s): CHOL, HDL, LDLCALC, TRIG, CHOLHDL, LDLDIRECT in the last 72 hours. Thyroid Function Tests: No results for input(s): TSH, T4TOTAL, FREET4, T3FREE, THYROIDAB in the last 72 hours. Anemia Panel: No results for input(s): VITAMINB12, FOLATE, FERRITIN, TIBC, IRON, RETICCTPCT in the last 72 hours. Sepsis Labs: Recent Labs  Lab 01/22/20 2059 01/23/20 0013 01/23/20 0931 01/24/20 0643 01/24/20 1546 01/24/20 1547 01/24/20 1858 01/25/20 0219 01/25/20 0757  PROCALCITON 80.51  --   --  136.62  --  109.53  --  70.41  --   LATICACIDVEN  --    < > 7.5*  --  2.7*  --  3.1*  --  1.3   < > = values in this  interval not displayed.    Recent Results (from the past 240 hour(s))  Urine culture     Status: Abnormal   Collection Time: 01/23/20 12:13 AM   Specimen: Urine, Random  Result Value Ref Range Status   Specimen Description   Final    URINE, RANDOM Performed at [facility], [address], [place], [place] [address]    Special Requests   Final    NONE Performed at [facility] [address] [place], [place] [address]    Culture >=100,000 COLONIES/mL ESCHERICHIA COLI (A)  Final   Report Status 01/25/2020 FINAL  Final   Organism ID, Bacteria ESCHERICHIA COLI (A)  Final      Susceptibility   Escherichia coli - MIC*    AMPICILLIN >=32 RESISTANT Resistant     CEFAZOLIN <=4 SENSITIVE Sensitive     CEFEPIME <=0.12 SENSITIVE Sensitive     CEFTRIAXONE <=0.25 SENSITIVE Sensitive     CIPROFLOXACIN <=0.25 SENSITIVE Sensitive     GENTAMICIN <=1 SENSITIVE Sensitive     IMIPENEM <=0.25 SENSITIVE Sensitive     NITROFURANTOIN <=16 SENSITIVE Sensitive     TRIMETH/SULFA >=320 RESISTANT Resistant      AMPICILLIN/SULBACTAM 4 SENSITIVE Sensitive     PIP/TAZO <=4 SENSITIVE Sensitive     * >=100,000 COLONIES/mL ESCHERICHIA COLI  Blood Culture (routine x 2)     Status: Abnormal   Collection Time: 01/23/20 12:13 AM   Specimen: BLOOD  Result Value Ref Range Status   Specimen Description   Final    BLOOD LEFT ANTECUBITAL Performed at [facility], [address], [place], [place] [address]    Special Requests   Final    BOTTLES DRAWN AEROBIC AND ANAEROBIC Blood Culture results may not be optimal due to an inadequate volume of blood received in culture bottles Performed at [facility], [address] [place], [place] [address]    Culture  Setup Time   Final    GRAM NEGATIVE RODS IN BOTH AEROBIC AND ANAEROBIC BOTTLES CRITICAL VALUE NOTED.  VALUE IS CONSISTENT WITH PREVIOUSLY REPORTED AND CALLED VALUE. Performed at [facility], [address] [place], [place] [address]    Culture (A)  Final    ESCHERICHIA COLI SUSCEPTIBILITIES PERFORMED ON PREVIOUS CULTURE WITHIN THE LAST 5 DAYS. Performed at [facility], [place] [address] [place], [place] [address]    Report Status 01/25/2020 FINAL  Final  Blood Culture (routine x 2)     Status: Abnormal   Collection Time: 01/23/20 12:13 AM   Specimen: BLOOD  Result Value Ref Range Status   Specimen Description   Final    BLOOD BLOOD RIGHT FOREARM Performed at [facility], [address], [place], [place] [address]    Special Requests   Final    BOTTLES DRAWN AEROBIC AND ANAEROBIC Blood Culture results may not be optimal due to an inadequate volume of blood received in culture bottles Performed at [facility], [address], [place], [place] [address]    Culture  Setup Time   Final    GRAM NEGATIVE RODS IN BOTH AEROBIC AND ANAEROBIC BOTTLES CRITICAL RESULT CALLED TO, READ BACK BY AND VERIFIED WITH: M.SLAUGHTER,PHARMD AT 1012 ON 01/23/20 BY GM Performed at [facility], [place]  [address], [place], [place] [address]    Culture ESCHERICHIA COLI (A)  Final   Report Status 01/25/2020 FINAL  Final   Organism ID, Bacteria ESCHERICHIA COLI  Final  Susceptibility   Escherichia coli - MIC*    AMPICILLIN >=32 RESISTANT Resistant     CEFAZOLIN <=4 SENSITIVE Sensitive     CEFEPIME <=0.12 SENSITIVE Sensitive     CEFTAZIDIME <=1 SENSITIVE Sensitive     CEFTRIAXONE <=0.25 SENSITIVE Sensitive     CIPROFLOXACIN <=0.25 SENSITIVE Sensitive     GENTAMICIN <=1 SENSITIVE Sensitive     IMIPENEM <=0.25 SENSITIVE Sensitive     TRIMETH/SULFA >=320 RESISTANT Resistant     AMPICILLIN/SULBACTAM 4 SENSITIVE Sensitive     PIP/TAZO <=4 SENSITIVE Sensitive     * ESCHERICHIA COLI  SARS CORONAVIRUS 2 (TAT 6-24 HRS) Nasopharyngeal Nasopharyngeal Swab     Status: None   Collection Time: 01/23/20 12:13 AM   Specimen: Nasopharyngeal Swab  Result Value Ref Range Status   SARS Coronavirus 2 NEGATIVE NEGATIVE Final    Comment: (NOTE) SARS-CoV-2 target nucleic acids are NOT DETECTED.  The SARS-CoV-2 RNA is generally detectable in upper and lower respiratory specimens during the acute phase of infection. Negative results do not preclude SARS-CoV-2 infection, do not rule out co-infections with other pathogens, and should not be used as the sole basis for treatment or other patient management decisions. Negative results must be combined with clinical observations, patient history, and epidemiological information. The expected result is Negative.  Fact Sheet for Patients: SugarRoll.be  Fact Sheet for Healthcare Providers: https://www.woods-mathews.com/  This test is not yet approved or cleared by the [place] FDA and  has been authorized for detection and/or diagnosis of SARS-CoV-2 by FDA under an Emergency Use Authorization (EUA). This EUA will remain  in effect (meaning this test can be used) for the duration of the COVID-19 declaration under Se  ction 564(b)(1) of the Act, 21 U.S.C. section 360bbb-3(b)(1), unless the authorization is terminated or revoked sooner.  Performed at [facility], [place] [address], [place], [place] [address]   Blood Culture ID Panel (Reflexed)     Status: Abnormal   Collection Time: 01/23/20 12:13 AM  Result Value Ref Range Status   Enterococcus faecalis NOT DETECTED NOT DETECTED Final   Enterococcus Faecium NOT DETECTED NOT DETECTED Final   Listeria monocytogenes NOT DETECTED NOT DETECTED Final   Staphylococcus species NOT DETECTED NOT DETECTED Final   Staphylococcus aureus (BCID) NOT DETECTED NOT DETECTED Final   Staphylococcus epidermidis NOT DETECTED NOT DETECTED Final   Staphylococcus lugdunensis NOT DETECTED NOT DETECTED Final   Streptococcus species NOT DETECTED NOT DETECTED Final   Streptococcus agalactiae NOT DETECTED NOT DETECTED Final   Streptococcus pneumoniae NOT DETECTED NOT DETECTED Final   Streptococcus pyogenes NOT DETECTED NOT DETECTED Final   A.calcoaceticus-baumannii NOT DETECTED NOT DETECTED Final   Bacteroides fragilis NOT DETECTED NOT DETECTED Final   Enterobacterales DETECTED (A) NOT DETECTED Final    Comment: Enterobacterales represent a large order of gram negative bacteria, not a single organism. CRITICAL RESULT CALLED TO, READ BACK BY AND VERIFIED WITH: M.SLAUGHTER,PHARMD AT 1012 ON 01/23/20 BY GM    Enterobacter cloacae complex NOT DETECTED NOT DETECTED Final   Escherichia coli DETECTED (A) NOT DETECTED Final    Comment: CRITICAL RESULT CALLED TO, READ BACK BY AND VERIFIED WITH: M.SLAUGHTER,PHARMD AT 1012 ON 01/23/20 BY GM    Klebsiella aerogenes NOT DETECTED NOT DETECTED Final   Klebsiella oxytoca NOT DETECTED NOT DETECTED Final   Klebsiella pneumoniae NOT DETECTED NOT DETECTED Final   Proteus species NOT DETECTED NOT DETECTED Final   Salmonella species NOT DETECTED NOT DETECTED Final   Serratia marcescens NOT  DETECTED NOT DETECTED Final   Haemophilus influenzae  NOT DETECTED NOT DETECTED Final   Neisseria meningitidis NOT DETECTED NOT DETECTED Final   Pseudomonas aeruginosa NOT DETECTED NOT DETECTED Final   Stenotrophomonas maltophilia NOT DETECTED NOT DETECTED Final   Candida albicans NOT DETECTED NOT DETECTED Final   Candida auris NOT DETECTED NOT DETECTED Final   Candida glabrata NOT DETECTED NOT DETECTED Final   Candida krusei NOT DETECTED NOT DETECTED Final   Candida parapsilosis NOT DETECTED NOT DETECTED Final   Candida tropicalis NOT DETECTED NOT DETECTED Final   Cryptococcus neoformans/gattii NOT DETECTED NOT DETECTED Final   CTX-M ESBL NOT DETECTED NOT DETECTED Final   Carbapenem resistance IMP NOT DETECTED NOT DETECTED Final   Carbapenem resistance KPC NOT DETECTED NOT DETECTED Final   Carbapenem resistance NDM NOT DETECTED NOT DETECTED Final   Carbapenem resist OXA 48 LIKE NOT DETECTED NOT DETECTED Final   Carbapenem resistance VIM NOT DETECTED NOT DETECTED Final    Comment: Performed at [facility], [address] [place], [place] [address]  Urine Culture     Status: None (Preliminary result)   Collection Time: 01/23/20  4:04 AM   Specimen: Urine, Random  Result Value Ref Range Status   Specimen Description   Final    URINE, RANDOM Performed at [facility], [address], [place], [place] [address]    Special Requests   Final    NONE Performed at [facility], [address] [place], [place] [address]    Culture   Final    CULTURE REINCUBATED FOR BETTER GROWTH CORRECTED ON 01/07 AT 0809: PREVIOUSLY REPORTED AS MULTIPLE SPECIES PRESENT, SUGGEST RECOLLECTION Performed at [facility], [address] [place], [place] [address]    Report Status PENDING  Incomplete  MRSA PCR Screening     Status: None   Collection Time: 01/23/20  5:23 AM   Specimen: Nasopharyngeal  Result Value Ref Range Status   MRSA by PCR NEGATIVE NEGATIVE Final    Comment:        The GeneXpert MRSA Assay  (FDA approved for NASAL specimens only), is one component of a comprehensive MRSA colonization surveillance program. It is not intended to diagnose MRSA infection nor to guide or monitor treatment for MRSA infections. Performed at [facility], [address], [place], [place] [address]       Radiology Studies: No results found.  Scheduled Meds: . Chlorhexidine Gluconate Cloth  6 each Topical Q0600  . heparin injection (subcutaneous)  5,000 Units Subcutaneous Q8H  . Warfarin - Pharmacist Dosing Inpatient   Does not apply q1600   Continuous Infusions: . sodium chloride 250 mL (01/23/20 1338)  . sodium chloride 100 mL/hr at 01/24/20 1845  .  ceFAZolin (ANCEF) IV 1 g (01/25/20 1143)     LOS: 2 days   Time spent: 35 minutes  Meldon Hanzlik Loann Quill, MD [facility]  If 7PM-7AM, please contact night-coverage [email or web address] 01/25/2020, 11:52 AM

## 2020-01-26 DIAGNOSIS — R6520 Severe sepsis without septic shock: Secondary | ICD-10-CM | POA: Diagnosis not present

## 2020-01-26 DIAGNOSIS — A4151 Sepsis due to Escherichia coli [E. coli]: Secondary | ICD-10-CM | POA: Diagnosis not present

## 2020-01-26 DIAGNOSIS — N171 Acute kidney failure with acute cortical necrosis: Secondary | ICD-10-CM | POA: Diagnosis not present

## 2020-01-26 LAB — CBC WITH DIFFERENTIAL/PLATELET
Abs Immature Granulocytes: 0.04 10*3/uL (ref 0.00–0.07)
Basophils Absolute: 0.1 10*3/uL (ref 0.0–0.1)
Basophils Relative: 1 %
Eosinophils Absolute: 0.4 10*3/uL (ref 0.0–0.5)
Eosinophils Relative: 5 %
HCT: 31.6 % — ABNORMAL LOW (ref 36.0–46.0)
Hemoglobin: 10.4 g/dL — ABNORMAL LOW (ref 12.0–15.0)
Immature Granulocytes: 1 %
Lymphocytes Relative: 16 %
Lymphs Abs: 1.3 10*3/uL (ref 0.7–4.0)
MCH: 30.6 pg (ref 26.0–34.0)
MCHC: 32.9 g/dL (ref 30.0–36.0)
MCV: 92.9 fL (ref 80.0–100.0)
Monocytes Absolute: 0.6 10*3/uL (ref 0.1–1.0)
Monocytes Relative: 7 %
Neutro Abs: 5.7 10*3/uL (ref 1.7–7.7)
Neutrophils Relative %: 70 %
Platelets: 121 10*3/uL — ABNORMAL LOW (ref 150–400)
RBC: 3.4 MIL/uL — ABNORMAL LOW (ref 3.87–5.11)
RDW: 14.9 % (ref 11.5–15.5)
WBC: 8.1 10*3/uL (ref 4.0–10.5)
nRBC: 0 % (ref 0.0–0.2)

## 2020-01-26 LAB — COMPREHENSIVE METABOLIC PANEL
ALT: 23 U/L (ref 0–44)
AST: 40 U/L (ref 15–41)
Albumin: 2.2 g/dL — ABNORMAL LOW (ref 3.5–5.0)
Alkaline Phosphatase: 53 U/L (ref 38–126)
Anion gap: 8 (ref 5–15)
BUN: 35 mg/dL — ABNORMAL HIGH (ref 8–23)
CO2: 23 mmol/L (ref 22–32)
Calcium: 9.2 mg/dL (ref 8.9–10.3)
Chloride: 118 mmol/L — ABNORMAL HIGH (ref 98–111)
Creatinine, Ser: 2.53 mg/dL — ABNORMAL HIGH (ref 0.44–1.00)
GFR, Estimated: 18 mL/min — ABNORMAL LOW (ref >60)
Glucose, Bld: 141 mg/dL — ABNORMAL HIGH (ref 70–99)
Potassium: 3.7 mmol/L (ref 3.5–5.1)
Sodium: 149 mmol/L — ABNORMAL HIGH (ref 135–145)
Total Bilirubin: 0.5 mg/dL (ref 0.3–1.2)
Total Protein: 5.4 g/dL — ABNORMAL LOW (ref 6.5–8.1)

## 2020-01-26 LAB — PROTIME-INR
INR: 2.6 — ABNORMAL HIGH (ref 0.8–1.2)
Prothrombin Time: 26.6 seconds — ABNORMAL HIGH (ref 11.4–15.2)

## 2020-01-26 LAB — PROCALCITONIN: Procalcitonin: 33.37 ng/mL

## 2020-01-26 MED ORDER — OXYCODONE HCL 5 MG PO TABS
5.0000 mg | ORAL_TABLET | ORAL | Status: DC | PRN
  Administered 2020-01-26 (×2): 5 mg via ORAL
  Filled 2020-01-26 (×2): qty 1

## 2020-01-26 MED ORDER — CEPHALEXIN 500 MG PO CAPS
500.0000 mg | ORAL_CAPSULE | Freq: Three times a day (TID) | ORAL | Status: AC
  Administered 2020-01-30 – 2020-02-05 (×16): 500 mg via ORAL
  Filled 2020-01-26 (×21): qty 1

## 2020-01-26 MED ORDER — LORAZEPAM 2 MG/ML IJ SOLN
0.5000 mg | Freq: Four times a day (QID) | INTRAMUSCULAR | Status: DC | PRN
  Administered 2020-01-26 – 2020-01-27 (×3): 0.5 mg via INTRAVENOUS
  Filled 2020-01-26 (×3): qty 1

## 2020-01-26 MED ORDER — HYDRALAZINE HCL 20 MG/ML IJ SOLN: 10.0000 mg | Freq: Four times a day (QID) | INTRAMUSCULAR | Status: DC | PRN

## 2020-01-26 MED ORDER — CALCITRIOL 0.25 MCG PO CAPS
0.5000 ug | ORAL_CAPSULE | Freq: Every day | ORAL | Status: DC
  Administered 2020-01-26 – 2020-02-03 (×8): 0.5 ug via ORAL
  Filled 2020-01-26 (×10): qty 2

## 2020-01-26 MED ORDER — FENTANYL CITRATE (PF) 100 MCG/2ML IJ SOLN
25.0000 ug | Freq: Once | INTRAMUSCULAR | Status: AC
Start: 2020-01-26 — End: 2020-01-26
  Administered 2020-01-26: 25 ug via INTRAVENOUS
  Filled 2020-01-26: qty 2

## 2020-01-26 MED ORDER — WARFARIN SODIUM 3 MG PO TABS
3.0000 mg | ORAL_TABLET | Freq: Once | ORAL | Status: AC
  Administered 2020-01-26: 3 mg via ORAL
  Filled 2020-01-26: qty 1

## 2020-01-26 NOTE — Progress Notes (Signed)
SLP F/U Note  Patient Details Name: Hailey Lopez MRN: [id number] DOB: 06-11-1929   Cancelled treatment:       Reason Eval/Treat Not Completed:  (chart reviewed; consulted NSG re: pt) . NSG reported pt was tolerating po's at meals and sips of liquids w/out overt s/s of aspiration noted; no decline in status w/ oral intake. Family endorsed same.  ST services will continue to f/u w/ pt's status while admitted for ongoing toleration of diet and education on diet/aspiration precautions.      Orinda Kenner, MS, CCC-SLP Speech Language Pathologist Rehab Services [phone] [facility] 01/26/2020, 11:52 AM

## 2020-01-26 NOTE — Progress Notes (Signed)
[place] for warfarin Indication: atrial fibrillation   Labs: Recent Labs    01/23/20 0931 01/23/20 0931 01/24/20 0643 01/25/20 0219 01/26/20 0446  HGB  --    < > 11.7* 10.6* 10.4*  HCT  --   --  34.3* 32.0* 31.6*  PLT  --   --  117* 120* 121*  LABPROT  --   --  30.2* 27.0* 26.6*  INR  --   --  3.0* 2.6* 2.6*  CREATININE 3.82*  --  3.87* 3.22* 2.53*  TROPONINIHS 819*  --   --   --   --    < > = values in this interval not displayed.    Estimated Creatinine Clearance: 13.3 mL/min (A) (by C-G formula based on SCr of 2.53 mg/dL (H)).   Medical History: Past Medical History:  Diagnosis Date  . Alzheimer's disease ([place])   . Atrial fibrillation ([place])   . Chronic kidney disease    Stage 4  . CVA (cerebrovascular accident) ([place])   . DVT (deep venous thrombosis) ([place]) 2013   LLE  . Hyperlipidemia   . Hypertension   . Pacemaker   . Pulmonary embolism [facility])     Assessment: 87 year old female presented with vomiting and diarrhea. Found to have left proximal ureteral calculus with obstruction, now s/p left ureteral stent placement. Blood cultures with 4/4 bottles E.coli, likely urinary source. Patient with h/o afib on warfarin PTA. Last known home dose warfarin 3 mg daily. INR therapeutic on admission.  Drug-drug interactions: Cefazolin Albumin: 3.3 >> 2.4 >> 2.2  Date INR Dose 1/4 2.1 3 mg (patient reported PTA) 1/5 -- Held 1/6 3.0 Held 1/7 2.6 3 mg 1/8 2.6 3 mg  Goal of Therapy:  INR 2-3 Monitor platelets by anticoagulation protocol: Yes   Plan:  --INR 2.6, therapeutic. Will order warfarin 3 mg x 1 today as per home regimen --Continue to monitor INR daily per protocol and CBC at least every 3 days.   Oswald Hillock 01/26/2020,9:10 AM

## 2020-01-26 NOTE — Progress Notes (Signed)
PROGRESS NOTE    Hailey Lopez  KAJ:[id number] DOB: 03-17-1929 DOA: 01/22/2020 PCP: Marda Stalker, PA-C   Brief Narrative:  Patient is 87 year old female with past medical history of A. fib-on Coumadin, CKD stage IV, hypertension, hyperlipidemia, CVA, Alzheimer's dementia presents to emergency department with vomiting, generalized weakness, one episode of diarrhea and syncope.  ED course: Patient afebrile, v BP: 145/108, pulse: 136, EKG shows sinus tachycardia and nonspecific ST-T wave changes.  Sodium 146, bicarb 20, anion gap 16, BUN 44, creatinine: 3.45, lactic acid 8.1, procalcitonin 80.51, WBC 11.7, INR: 2.1.  UA is consistent with UTI.  Chest x-ray negative for acute findings.  CT head is negative.  CT abdomen and pelvis showed mild left hydronephrosis with perinephric stranding due to obstructing 15 mm calculus in the left Vitro pelvic junction.  She met sepsis criteria therefore she was given 2 L of IV fluid bolus and started on cefepime.  Urology was consulted.  She tested negative for COVID-19.  Patient admitted under PCCM.  [facility] took over care from 01/23/2018  Assessment & Plan:   Severe sepsis in the setting of obstructive uropathy/complicated URI, E. coli/Enterobacterales bacteremia: -Status post left ureteral stent placement by urology.  POD 3. -Urology consulted-recommend to discontinue Foley catheter with plan for follow-up bladder scan to ensure appropriate emptying. -Follow-up with urology outpatient to discuss definitive stone management. -Patient is afebrile.  Leukocytosis: Resolved.  Procalcitonin level: 80.51-136-109-70.41-33.37. -Continue cefazolin.  She will need total of 14 days of antibiotics (01/29/2020)-switch to p.o. Keflex 500 mg every 8 hours to complete 14 days course.  Last day of therapy would be on 02/06/2020.  Appreciate IDs recommendation.  AKI on CKD stage IV: -Baseline creatinine: 1.95.  Creatinine: 3.87, GFR: 11. -In the setting of  obstructive uropathy.  Renal function improving. -Nephrology consulted-no indication for dialysis.  Not a candidate for long-term dialysis at this time.    Elevated troponin: -Troponin 214 trended up to 764-819.  EKG: No acute ST-T wave changes noted.  Likely in the setting of demand ischemia in the setting of sepsis -Continue to monitor.  Patient denies any ACS symptoms.  Jerking movements: -Unknown etiology?.  Reviewed meds.  Vitals remained stable.  Could be secondary to pain/discomfort due to purewick.  Will add oxycodone and Ativan as needed.  History of PE and DVT: Continue Coumadin as per pharmacy.  Monitor PT/INR  Hyperlipidemia: Continue atorvastatin  Alzheimer's dementia: Continue donepezil  Hypertension: Blood pressure slightly elevated this morning.  Continue Isordil -Hydralazine as needed for blood pressure more than 160/100. -Monitor blood pressure closely  Thrombocytopenia: Platelet 120-121.  No signs of active bleeding.  Repeat CBC tomorrow AM.  DVT prophylaxis: Coumadin Code Status: DNR Family Communication: Patient's niece present at bedside.  Plan of care discussed with patient in length and she verbalized understanding and agreed with it. Discussed plan of care with patient's niece at the bedside and she verbalized understanding.  Disposition Plan: To be determined  Consultants:   PCCM  ID  Nephrology  Neurology  Procedures:   Left ureteral stent placement  Antimicrobials:   Rocephin  Status is: Inpatient   Dispo: The patient is from: Home              Anticipated d/c is to: SNF              Anticipated d/c date is: 3 days              Patient currently is not medically stable to d/c.  Subjective: Patient seen and examined.  Niece at bedside-concerned about patient's jerking movements.  Patient remained afebrile.  No acute events overnight.  Objective: Vitals:   01/25/20 2043 01/26/20 0024 01/26/20 0535 01/26/20 0800  BP: (!) 169/87  (!) 162/78 (!) 164/82 (!) 154/75  Pulse: 82 73 70 83  Resp: '18 17  16  ' Temp: 98.7 F (37.1 C) 98.3 F (36.8 C) 98.4 F (36.9 C) 98.5 F (36.9 C)  TempSrc: Oral   Oral  SpO2: 98% 100% 99% 100%  Weight:   61.6 kg   Height:        Intake/Output Summary (Last 24 hours) at 01/26/2020 1141 Last data filed at 01/26/2020 1058 Gross per 24 hour  Intake 120 ml  Output -  Net 120 ml   Filed Weights   01/24/20 0500 01/25/20 0500 01/26/20 0535  Weight: 64.8 kg 64.5 kg 61.6 kg    Examination:  General exam: Appears calm and comfortable, on room air, niece helping with the breakfast.  Elderly looking.  Alert, not talking, jerking movement noted intermittently during the encounter. respiratory system: Clear to auscultation. Respiratory effort normal. Cardiovascular system: S1 & S2 heard, RRR. No JVD, murmurs, rubs, gallops or clicks. No pedal edema. Gastrointestinal system: Abdomen is nondistended, soft and nontender. No organomegaly or masses felt. Normal bowel sounds heard.  Has Foley. Central nervous system: Alert, awake but not talking Extremities: Symmetric 5 x 5 power. Skin: No rashes, lesions or ulcers   Data Reviewed: I have personally reviewed following labs and imaging studies  CBC: Recent Labs  Lab 01/22/20 2059 01/24/20 0643 01/25/20 0219 01/26/20 0446  WBC 11.7* 15.2* 11.7* 8.1  NEUTROABS  --  13.0* 9.5* 5.7  HGB 12.9 11.7* 10.6* 10.4*  HCT 39.5 34.3* 32.0* 31.6*  MCV 94.5 91.2 92.5 92.9  PLT 185 117* 120* 754*   Basic Metabolic Panel: Recent Labs  Lab 01/22/20 2059 01/23/20 0931 01/24/20 0643 01/25/20 0219 01/26/20 0446  NA 146* 145 143 145 149*  K 3.5 4.1 3.6 3.5 3.7  CL 110 109 107 113* 118*  CO2 20* 19* '24 24 23  ' GLUCOSE 183* 122* 136* 171* 141*  BUN 44* 47* 48* 42* 35*  CREATININE 3.48* 3.82* 3.87* 3.22* 2.53*  CALCIUM 10.3 10.1 9.1 9.2 9.2   GFR: Estimated Creatinine Clearance: 13.3 mL/min (A) (by C-G formula based on SCr of 2.53 mg/dL  (H)). Liver Function Tests: Recent Labs  Lab 01/22/20 2330 01/24/20 0643 01/25/20 0219 01/26/20 0446  AST 41 55* 44* 40  ALT '16 19 19 23  ' ALKPHOS 68 57 61 53  BILITOT 0.9 0.7 0.6 0.5  PROT 7.1 5.8* 5.7* 5.4*  ALBUMIN 3.3* 2.4* 2.2* 2.2*   Recent Labs  Lab 01/22/20 2330  LIPASE 69*   No results for input(s): AMMONIA in the last 168 hours. Coagulation Profile: Recent Labs  Lab 01/22/20 2059 01/24/20 0643 01/25/20 0219 01/26/20 0446  INR 2.1* 3.0* 2.6* 2.6*   Cardiac Enzymes: No results for input(s): CKTOTAL, CKMB, CKMBINDEX, TROPONINI in the last 168 hours. BNP (last 3 results) No results for input(s): PROBNP in the last 8760 hours. HbA1C: No results for input(s): HGBA1C in the last 72 hours. CBG: Recent Labs  Lab 01/24/20 1115 01/24/20 1640 01/24/20 2002 01/25/20 0813 01/25/20 1205  GLUCAP 110* 104* 68* 94 107*   Lipid Profile: No results for input(s): CHOL, HDL, LDLCALC, TRIG, CHOLHDL, LDLDIRECT in the last 72 hours. Thyroid Function Tests: No results for input(s): TSH, T4TOTAL, FREET4, T3FREE, THYROIDAB  in the last 72 hours. Anemia Panel: No results for input(s): VITAMINB12, FOLATE, FERRITIN, TIBC, IRON, RETICCTPCT in the last 72 hours. Sepsis Labs: Recent Labs  Lab 01/24/20 1027 01/24/20 1546 01/24/20 1547 01/24/20 1858 01/25/20 0219 01/25/20 0757 01/25/20 1137 01/26/20 0446  PROCALCITON 136.62  --  109.53  --  70.41  --   --  33.37  LATICACIDVEN  --  2.7*  --  3.1*  --  1.3 1.7  --     Recent Results (from the past 240 hour(s))  Urine culture     Status: Abnormal   Collection Time: 01/23/20 12:13 AM   Specimen: Urine, Random  Result Value Ref Range Status   Specimen Description   Final    URINE, RANDOM Performed at [facility], [address] [place], [place] [address]    Special Requests   Final    NONE Performed at [facility], [address] [place], [place] [address]    Culture >=100,000 COLONIES/mL  ESCHERICHIA COLI (A)  Final   Report Status 01/25/2020 FINAL  Final   Organism ID, Bacteria ESCHERICHIA COLI (A)  Final      Susceptibility   Escherichia coli - MIC*    AMPICILLIN >=32 RESISTANT Resistant     CEFAZOLIN <=4 SENSITIVE Sensitive     CEFEPIME <=0.12 SENSITIVE Sensitive     CEFTRIAXONE <=0.25 SENSITIVE Sensitive     CIPROFLOXACIN <=0.25 SENSITIVE Sensitive     GENTAMICIN <=1 SENSITIVE Sensitive     IMIPENEM <=0.25 SENSITIVE Sensitive     NITROFURANTOIN <=16 SENSITIVE Sensitive     TRIMETH/SULFA >=320 RESISTANT Resistant     AMPICILLIN/SULBACTAM 4 SENSITIVE Sensitive     PIP/TAZO <=4 SENSITIVE Sensitive     * >=100,000 COLONIES/mL ESCHERICHIA COLI  Blood Culture (routine x 2)     Status: Abnormal   Collection Time: 01/23/20 12:13 AM   Specimen: BLOOD  Result Value Ref Range Status   Specimen Description   Final    BLOOD LEFT ANTECUBITAL Performed at [facility], [address] [place], [place] [address]    Special Requests   Final    BOTTLES DRAWN AEROBIC AND ANAEROBIC Blood Culture results may not be optimal due to an inadequate volume of blood received in culture bottles Performed at [facility], [address] [place], [place] [address]    Culture  Setup Time   Final    GRAM NEGATIVE RODS IN BOTH AEROBIC AND ANAEROBIC BOTTLES CRITICAL VALUE NOTED.  VALUE IS CONSISTENT WITH PREVIOUSLY REPORTED AND CALLED VALUE. Performed at [facility], [address] [place], [place] [address]    Culture (A)  Final    ESCHERICHIA COLI SUSCEPTIBILITIES PERFORMED ON PREVIOUS CULTURE WITHIN THE LAST 5 DAYS. Performed at [facility], [place] [address], [place], [place] [address]    Report Status 01/25/2020 FINAL  Final  Blood Culture (routine x 2)     Status: Abnormal   Collection Time: 01/23/20 12:13 AM   Specimen: BLOOD  Result Value Ref Range Status   Specimen Description   Final    BLOOD BLOOD RIGHT FOREARM Performed at [facility], [address] [place], [place] [address]    Special Requests   Final    BOTTLES DRAWN AEROBIC AND ANAEROBIC Blood Culture results may not be optimal due to an inadequate volume of blood received in culture bottles Performed at [facility], [address], [place], [place] [address]    Culture  Setup Time   Final    GRAM NEGATIVE RODS IN BOTH AEROBIC AND ANAEROBIC BOTTLES CRITICAL RESULT CALLED TO, READ BACK BY AND VERIFIED WITH: M.SLAUGHTER,PHARMD AT 1012 ON 01/23/20 BY GM Performed at [facility], [place] [address] [place], [place] [address]    Culture ESCHERICHIA COLI (A)  Final   Report Status 01/25/2020 FINAL  Final   Organism ID, Bacteria ESCHERICHIA COLI  Final      Susceptibility   Escherichia coli - MIC*    AMPICILLIN >=32 RESISTANT Resistant     CEFAZOLIN <=4 SENSITIVE Sensitive     CEFEPIME <=0.12 SENSITIVE Sensitive     CEFTAZIDIME <=1 SENSITIVE Sensitive     CEFTRIAXONE <=0.25 SENSITIVE Sensitive     CIPROFLOXACIN <=0.25 SENSITIVE Sensitive     GENTAMICIN <=1 SENSITIVE Sensitive     IMIPENEM <=0.25 SENSITIVE Sensitive     TRIMETH/SULFA >=320 RESISTANT Resistant     AMPICILLIN/SULBACTAM 4 SENSITIVE Sensitive     PIP/TAZO <=4 SENSITIVE Sensitive     * ESCHERICHIA COLI  SARS CORONAVIRUS 2 (TAT 6-24 HRS) Nasopharyngeal Nasopharyngeal Swab     Status: None   Collection Time: 01/23/20 12:13 AM   Specimen: Nasopharyngeal Swab  Result Value Ref Range Status   SARS Coronavirus 2 NEGATIVE NEGATIVE Final    Comment: (NOTE) SARS-CoV-2 target nucleic acids are NOT DETECTED.  The SARS-CoV-2 RNA is generally detectable in upper and lower respiratory specimens during the acute phase of infection. Negative results do not preclude SARS-CoV-2 infection, do not rule out co-infections with other pathogens, and should not be used as the sole basis for treatment or other patient management decisions. Negative results must be combined with clinical  observations, patient history, and epidemiological information. The expected result is Negative.  Fact Sheet for Patients: SugarRoll.be  Fact Sheet for Healthcare Providers: https://www.woods-mathews.com/  This test is not yet approved or cleared by the [place] FDA and  has been authorized for detection and/or diagnosis of SARS-CoV-2 by FDA under an Emergency Use Authorization (EUA). This EUA will remain  in effect (meaning this test can be used) for the duration of the COVID-19 declaration under Se ction 564(b)(1) of the Act, 21 U.S.C. section 360bbb-3(b)(1), unless the authorization is terminated or revoked sooner.  Performed at [facility], [place] [address] [place], [place] [address]   Blood Culture ID Panel (Reflexed)     Status: Abnormal   Collection Time: 01/23/20 12:13 AM  Result Value Ref Range Status   Enterococcus faecalis NOT DETECTED NOT DETECTED Final   Enterococcus Faecium NOT DETECTED NOT DETECTED Final   Listeria monocytogenes NOT DETECTED NOT DETECTED Final   Staphylococcus species NOT DETECTED NOT DETECTED Final   Staphylococcus aureus (BCID) NOT DETECTED NOT DETECTED Final   Staphylococcus epidermidis NOT DETECTED NOT DETECTED Final   Staphylococcus lugdunensis NOT DETECTED NOT DETECTED Final   Streptococcus species NOT DETECTED NOT DETECTED Final   Streptococcus agalactiae NOT DETECTED NOT DETECTED Final   Streptococcus pneumoniae NOT DETECTED NOT DETECTED Final   Streptococcus pyogenes NOT DETECTED NOT DETECTED Final   A.calcoaceticus-baumannii NOT DETECTED NOT DETECTED Final   Bacteroides fragilis NOT DETECTED NOT DETECTED Final   Enterobacterales DETECTED (A) NOT DETECTED Final    Comment: Enterobacterales represent a large order of gram negative bacteria, not a single organism. CRITICAL RESULT CALLED TO, READ BACK BY AND VERIFIED WITH: M.SLAUGHTER,PHARMD AT 1012 ON 01/23/20 BY GM    Enterobacter  cloacae complex NOT DETECTED NOT DETECTED Final  Escherichia coli DETECTED (A) NOT DETECTED Final    Comment: CRITICAL RESULT CALLED TO, READ BACK BY AND VERIFIED WITH: M.SLAUGHTER,PHARMD AT 1012 ON 01/23/20 BY GM    Klebsiella aerogenes NOT DETECTED NOT DETECTED Final   Klebsiella oxytoca NOT DETECTED NOT DETECTED Final   Klebsiella pneumoniae NOT DETECTED NOT DETECTED Final   Proteus species NOT DETECTED NOT DETECTED Final   Salmonella species NOT DETECTED NOT DETECTED Final   Serratia marcescens NOT DETECTED NOT DETECTED Final   Haemophilus influenzae NOT DETECTED NOT DETECTED Final   Neisseria meningitidis NOT DETECTED NOT DETECTED Final   Pseudomonas aeruginosa NOT DETECTED NOT DETECTED Final   Stenotrophomonas maltophilia NOT DETECTED NOT DETECTED Final   Candida albicans NOT DETECTED NOT DETECTED Final   Candida auris NOT DETECTED NOT DETECTED Final   Candida glabrata NOT DETECTED NOT DETECTED Final   Candida krusei NOT DETECTED NOT DETECTED Final   Candida parapsilosis NOT DETECTED NOT DETECTED Final   Candida tropicalis NOT DETECTED NOT DETECTED Final   Cryptococcus neoformans/gattii NOT DETECTED NOT DETECTED Final   CTX-M ESBL NOT DETECTED NOT DETECTED Final   Carbapenem resistance IMP NOT DETECTED NOT DETECTED Final   Carbapenem resistance KPC NOT DETECTED NOT DETECTED Final   Carbapenem resistance NDM NOT DETECTED NOT DETECTED Final   Carbapenem resist OXA 48 LIKE NOT DETECTED NOT DETECTED Final   Carbapenem resistance VIM NOT DETECTED NOT DETECTED Final    Comment: Performed at [facility], [address], [place], [place] [address]  Urine Culture     Status: None (Preliminary result)   Collection Time: 01/23/20  4:04 AM   Specimen: Urine, Random  Result Value Ref Range Status   Specimen Description   Final    URINE, RANDOM Performed at [facility], [address], [place], [place] [address]    Special Requests   Final    NONE Performed at  [facility], [address] [place], [place] [address]    Culture   Final    CULTURE REINCUBATED FOR BETTER GROWTH CORRECTED ON 01/07 AT 0809: PREVIOUSLY REPORTED AS MULTIPLE SPECIES PRESENT, SUGGEST RECOLLECTION Performed at [facility], [address] [place], [place] [address]    Report Status PENDING  Incomplete  MRSA PCR Screening     Status: None   Collection Time: 01/23/20  5:23 AM   Specimen: Nasopharyngeal  Result Value Ref Range Status   MRSA by PCR NEGATIVE NEGATIVE Final    Comment:        The GeneXpert MRSA Assay (FDA approved for NASAL specimens only), is one component of a comprehensive MRSA colonization surveillance program. It is not intended to diagnose MRSA infection nor to guide or monitor treatment for MRSA infections. Performed at [facility], [address] [place], [place] [address]       Radiology Studies: No results found.  Scheduled Meds: . atorvastatin  10 mg Oral Daily  . [START ON 01/30/2020] cephALEXin  500 mg Oral Q8H  . Chlorhexidine Gluconate Cloth  6 each Topical Q0600  . donepezil  5 mg Oral QHS  . isosorbide dinitrate  5 mg Oral Daily  . warfarin  3 mg Oral ONCE-1600  . Warfarin - Pharmacist Dosing Inpatient   Does not apply q1600   Continuous Infusions: . sodium chloride 250 mL (01/23/20 1338)  .  ceFAZolin (ANCEF) IV 1 g (01/26/20 0840)     LOS: 3 days   Time spent: 35 minutes  Wilian Kwong R  Searcy Miyoshi, MD [facility]  If 7PM-7AM, please contact night-coverage [email or web address] 01/26/2020, 11:41 AM

## 2020-01-26 NOTE — Progress Notes (Signed)
Speech Language Pathology Dysphagia Treatment Patient Details Name: Hailey Lopez MRN: [id number] DOB: [birth year] Today's Date: 01/26/2020 Time: 3903-0092 SLP Time Calculation (min) (ACUTE ONLY): 33 min  Assessment / Plan / Recommendation Clinical Impression   Patient seen for skilled ST treatment for dysphagia to assess diet tolerance. Patient appears to be in pain this morning, restless and jerking particularly noted when repositioned and when nurse tech taking vitals. Niece arrived and patient was receptive to her assistance for feeding. She required repositioning frequently due to side leaning and sinking down in the bed. SLP demo'd use of pillows to support midline position. Possibly due to being more distracted by pain today, patient displaying oral holding, prolonged bolus formation and reduced clearance of soft solids. Minimal holding, no residue with puree or thin liquids. There are no overt signs of aspiration with any consistency. Pt tolerated medications crushed in puree, with occasional cues necessary for mouth opening, bolus acceptance. Discussed aspiration risks with pt's niece, who is in agreement for downgrade to dysphagia 1 (puree) at this time. Educated re: precautions including upright, midline position, feeding only when alert, monitoring for oral holding/pocketing. SLP to follow up for tolerance, possible advancement.    Diet Recommendation       SLP Plan Continue with current plan of care   Pertinent Vitals/Pain    Swallowing Goals     General Behavior/Cognition: Alert;Agitated;Distractible;Requires cueing;Confused Patient Positioning: Upright in bed Oral care provided: N/A HPI: Ms. Hailey Lopez is a 87 y.o. female with history of dementia, pulmonary embolism, hypertension, hyperlipidemia, CVA, atrial fibrillation, who was admitted to [facility] on 01/22/2020 for Ureterolithiasis, s/p cystoscopy and left ureteral stent placement. Head CT revealed no acute intracranial  hemorrhage or infarct, advanced senescent changes and borderline ventricular enlargement. Chest x-ray was negative for cardiopulmonary disease.  Oral Cavity - Oral Hygiene     Dysphagia Treatment Family/Caregiver Educated: niece Hailey Lopez Treatment Methods: Skilled observation;Differential diagnosis;Compensation strategy training;Patient/caregiver education Patient observed directly with PO's: Yes Type of PO's observed: Dysphagia 2 (chopped);Dysphagia 1 (puree);Thin liquids Feeding: Total assist Liquids provided via: Straw Oral Phase Signs & Symptoms: Prolonged mastication;Oral holding;Prolonged bolus formation Pharyngeal Phase Signs & Symptoms: Other (comment) (none) Type of cueing: Verbal;Tactile;Visual Amount of cueing: Moderate   GO    Hailey Lopez, [place], CCC-SLP Speech-Language Pathologist  Aliene Altes 01/26/2020, 9:04 AM

## 2020-01-27 DIAGNOSIS — N171 Acute kidney failure with acute cortical necrosis: Secondary | ICD-10-CM | POA: Diagnosis not present

## 2020-01-27 DIAGNOSIS — A4151 Sepsis due to Escherichia coli [E. coli]: Secondary | ICD-10-CM | POA: Diagnosis not present

## 2020-01-27 DIAGNOSIS — R6520 Severe sepsis without septic shock: Secondary | ICD-10-CM | POA: Diagnosis not present

## 2020-01-27 LAB — COMPREHENSIVE METABOLIC PANEL
ALT: 20 U/L (ref 0–44)
AST: 33 U/L (ref 15–41)
Albumin: 2.2 g/dL — ABNORMAL LOW (ref 3.5–5.0)
Alkaline Phosphatase: 50 U/L (ref 38–126)
Anion gap: 7 (ref 5–15)
BUN: 31 mg/dL — ABNORMAL HIGH (ref 8–23)
CO2: 23 mmol/L (ref 22–32)
Calcium: 9.1 mg/dL (ref 8.9–10.3)
Chloride: 118 mmol/L — ABNORMAL HIGH (ref 98–111)
Creatinine, Ser: 2.3 mg/dL — ABNORMAL HIGH (ref 0.44–1.00)
GFR, Estimated: 20 mL/min — ABNORMAL LOW (ref >60)
Glucose, Bld: 120 mg/dL — ABNORMAL HIGH (ref 70–99)
Potassium: 3.8 mmol/L (ref 3.5–5.1)
Sodium: 148 mmol/L — ABNORMAL HIGH (ref 135–145)
Total Bilirubin: 0.8 mg/dL (ref 0.3–1.2)
Total Protein: 5.6 g/dL — ABNORMAL LOW (ref 6.5–8.1)

## 2020-01-27 LAB — CBC
HCT: 31.7 % — ABNORMAL LOW (ref 36.0–46.0)
Hemoglobin: 10.4 g/dL — ABNORMAL LOW (ref 12.0–15.0)
MCH: 30.2 pg (ref 26.0–34.0)
MCHC: 32.8 g/dL (ref 30.0–36.0)
MCV: 92.2 fL (ref 80.0–100.0)
Platelets: 148 10*3/uL — ABNORMAL LOW (ref 150–400)
RBC: 3.44 MIL/uL — ABNORMAL LOW (ref 3.87–5.11)
RDW: 14.9 % (ref 11.5–15.5)
WBC: 7.4 10*3/uL (ref 4.0–10.5)
nRBC: 0 % (ref 0.0–0.2)

## 2020-01-27 LAB — URINE CULTURE: Culture: 50000 — AB

## 2020-01-27 LAB — MAGNESIUM: Magnesium: 1.8 mg/dL (ref 1.7–2.4)

## 2020-01-27 LAB — PROTIME-INR
INR: 2.9 — ABNORMAL HIGH (ref 0.8–1.2)
Prothrombin Time: 29.3 seconds — ABNORMAL HIGH (ref 11.4–15.2)

## 2020-01-27 MED ORDER — WARFARIN SODIUM 2 MG PO TABS
2.0000 mg | ORAL_TABLET | Freq: Once | ORAL | Status: AC
  Administered 2020-01-27: 2 mg via ORAL
  Filled 2020-01-27: qty 1

## 2020-01-27 NOTE — Progress Notes (Signed)
ANTICOAGULATION CONSULT NOTE  Pharmacy Consult for warfarin Indication: atrial fibrillation   Labs: Recent Labs    01/25/20 0219 01/26/20 0446 01/27/20 0428  HGB 10.6* 10.4* 10.4*  HCT 32.0* 31.6* 31.7*  PLT 120* 121* 148*  LABPROT 27.0* 26.6* 29.3*  INR 2.6* 2.6* 2.9*  CREATININE 3.22* 2.53* 2.30*    Estimated Creatinine Clearance: 14.6 mL/min (A) (by C-G formula based on SCr of 2.3 mg/dL (H)).   Medical History: Past Medical History:  Diagnosis Date  . Alzheimer's disease ([place])   . Atrial fibrillation ([place])   . Chronic kidney disease    Stage 4  . CVA (cerebrovascular accident) ([place])   . DVT (deep venous thrombosis) ([place]) 2013   LLE  . Hyperlipidemia   . Hypertension   . Pacemaker   . Pulmonary embolism [facility])     Assessment: 87 year old female presented with vomiting and diarrhea. Found to have left proximal ureteral calculus with obstruction, now s/p left ureteral stent placement. Blood cultures with 4/4 bottles E.coli, likely urinary source. Patient with h/o afib on warfarin PTA. Last known home dose warfarin 3 mg daily. INR therapeutic on admission.  Drug-drug interactions: ancef  Albumin: 3.3 >> 2.4 >> 2.2  Date INR Dose 1/4 2.1 3 mg (patient reported PTA) 1/5 -- Held 1/6 3.0 Held 1/7 2.6 3 mg 1/8 2.6 3 mg 1/9 2.9 2 mg  Goal of Therapy:  INR 2-3 Monitor platelets by anticoagulation protocol: Yes   Plan:  INR is therapeutic but on the upper limit of normal. Will order warfarin 2 mg x 1 today as per home regimen --Continue to monitor INR daily per protocol and CBC at least every 3 days.   Oswald Hillock, PharmD, BCPS 01/27/2020,9:12 AM

## 2020-01-27 NOTE — Progress Notes (Addendum)
PROGRESS NOTE    Hailey Lopez  HCW:[id number] DOB: [birth year] DOA: 01/22/2020 PCP: Marda Stalker, PA-C   Brief Narrative:  Patient is 87 year old female with past medical history of A. fib-on Coumadin, CKD stage IV, hypertension, hyperlipidemia, CVA, Alzheimer's dementia presents to emergency department with vomiting, generalized weakness, one episode of diarrhea and syncope.  ED course: Patient afebrile, v BP: 145/108, pulse: 136, EKG shows sinus tachycardia and nonspecific ST-T wave changes.  Sodium 146, bicarb 20, anion gap 16, BUN 44, creatinine: 3.45, lactic acid 8.1, procalcitonin 80.51, WBC 11.7, INR: 2.1.  UA is consistent with UTI.  Chest x-ray negative for acute findings.  CT head is negative.  CT abdomen and pelvis showed mild left hydronephrosis with perinephric stranding due to obstructing 15 mm calculus in the left Vitro pelvic junction.  She met sepsis criteria therefore she was given 2 L of IV fluid bolus and started on cefepime.  Urology was consulted.  She tested negative for COVID-19.  Patient admitted under PCCM.  [facility] took over care from 01/23/2018  Assessment & Plan:   Severe sepsis in the setting of obstructive uropathy/complicated Ecoli UTI, E. coli/Enterobacterales bacteremia: -Status post left ureteral stent placement by urology.  POD 4. -Urology consulted- discontinue Foley catheter -Follow-up with urology outpatient to discuss definitive stone management. -Patient is afebrile.  Leukocytosis: Resolved.  Procalcitonin level: 80.51-136-109-70.41-33.37. -Continue cefazolin.  She will need total of 14 days of antibiotics (01/29/2020)-switch to p.o. Keflex 500 mg every 8 hours to complete 14 days course.  Last day of therapy would be on 02/06/2020.  Appreciate IDs recommendation.  AKI on CKD stage IV: -Baseline creatinine: 1.95.  On admission: Creatinine: 3.87, GFR: 11. -In the setting of obstructive uropathy.  Renal function improving. -Nephrology  consulted-no indication for dialysis.  Not a candidate for long-term dialysis at this time.    Elevated troponin: -Troponin 214 trended up to 764-819.  EKG: No acute ST-T wave changes noted.  Likely in the setting of demand ischemia in the setting of sepsis -Continue to monitor.  Patient denies any ACS symptoms.  Jerking movements: -Unknown etiology?.  Improving as per RN & the family.  Reviewed meds with pharmacy.  Vitals remained stable.  Could be secondary to pain/discomfort due to purewick.  Continue oxycodone and Ativan as needed. -We will get EEG to rule out underlying seizures.  History of PE and DVT: Continue Coumadin as per pharmacy.  Monitor PT/INR  Hyperlipidemia: Continue atorvastatin  Alzheimer's dementia: Continue donepezil  Hypertension: Blood pressure in 140s/70s.  Continue Isordil -Hydralazine as needed for blood pressure more than 160/100. -Monitor blood pressure closely  Thrombocytopenia: Platelet 120-121-148.  No signs of active bleeding.  Repeat CBC tomorrow AM.  DVT prophylaxis: Coumadin Code Status: DNR Family Communication: None present at bedside.  Plan of care discussed with patient in length and she verbalized understanding and agreed with it.  I called patient's niece [name] and I discussed plan of care and she verbalized understanding.  Disposition Plan: To be determined  Consultants:   PCCM  ID  Nephrology  urology  Procedures:   Left ureteral stent placement  Antimicrobials:   Rocephin  Cefazolin  Status is: Inpatient   Dispo: The patient is from: Home              Anticipated d/c is to: SNF              Anticipated d/c date is: 3 days  Patient currently is not medically stable to d/c.    Subjective: Patient seen and examined.  No family at the bedside.  As per RN patient jerking movement improved with Ativan.  No acute events overnight.  Remained afebrile.  Objective: Vitals:   01/27/20 0035 01/27/20 0441  01/27/20 0613 01/27/20 0838  BP: (!) 146/70 (!) 148/74  (!) 144/75  Pulse: 77 76  81  Resp: '15 15  16  ' Temp: 98.4 F (36.9 C) 98.2 F (36.8 C)  99.1 F (37.3 C)  TempSrc: Axillary Axillary  Axillary  SpO2: 100% 97%  100%  Weight: 63.8 kg  63.2 kg   Height:        Intake/Output Summary (Last 24 hours) at 01/27/2020 1130 Last data filed at 01/27/2020 1028 Gross per 24 hour  Intake 360.45 ml  Output 400 ml  Net -39.55 ml   Filed Weights   01/26/20 0535 01/27/20 0035 01/27/20 0613  Weight: 61.6 kg 63.8 kg 63.2 kg    Examination:  General exam: Appears calm and comfortable, elderly, on room air, alert and following commands.   Respiratory system: Clear to auscultation. Respiratory effort normal. Cardiovascular system: S1 & S2 heard, RRR. No JVD, murmurs, rubs, gallops or clicks. No pedal edema. Gastrointestinal system: Abdomen is nondistended, soft and nontender. No organomegaly or masses felt. Normal bowel sounds heard.  Has Foley. Central nervous system: Alert, awake and following commands. Extremities: Symmetric 5 x 5 power. Skin: No rashes, lesions or ulcers   Data Reviewed: I have personally reviewed following labs and imaging studies  CBC: Recent Labs  Lab 01/22/20 2059 01/24/20 0643 01/25/20 0219 01/26/20 0446 01/27/20 0428  WBC 11.7* 15.2* 11.7* 8.1 7.4  NEUTROABS  --  13.0* 9.5* 5.7  --   HGB 12.9 11.7* 10.6* 10.4* 10.4*  HCT 39.5 34.3* 32.0* 31.6* 31.7*  MCV 94.5 91.2 92.5 92.9 92.2  PLT 185 117* 120* 121* 893*   Basic Metabolic Panel: Recent Labs  Lab 01/23/20 0931 01/24/20 0643 01/25/20 0219 01/26/20 0446 01/27/20 0428  NA 145 143 145 149* 148*  K 4.1 3.6 3.5 3.7 3.8  CL 109 107 113* 118* 118*  CO2 19* '24 24 23 23  ' GLUCOSE 122* 136* 171* 141* 120*  BUN 47* 48* 42* 35* 31*  CREATININE 3.82* 3.87* 3.22* 2.53* 2.30*  CALCIUM 10.1 9.1 9.2 9.2 9.1  MG  --   --   --   --  1.8   GFR: Estimated Creatinine Clearance: 14.6 mL/min (A) (by C-G formula  based on SCr of 2.3 mg/dL (H)). Liver Function Tests: Recent Labs  Lab 01/22/20 2330 01/24/20 0643 01/25/20 0219 01/26/20 0446 01/27/20 0428  AST 41 55* 44* 40 33  ALT '16 19 19 23 20  ' ALKPHOS 68 57 61 53 50  BILITOT 0.9 0.7 0.6 0.5 0.8  PROT 7.1 5.8* 5.7* 5.4* 5.6*  ALBUMIN 3.3* 2.4* 2.2* 2.2* 2.2*   Recent Labs  Lab 01/22/20 2330  LIPASE 69*   No results for input(s): AMMONIA in the last 168 hours. Coagulation Profile: Recent Labs  Lab 01/22/20 2059 01/24/20 0643 01/25/20 0219 01/26/20 0446 01/27/20 0428  INR 2.1* 3.0* 2.6* 2.6* 2.9*   Cardiac Enzymes: No results for input(s): CKTOTAL, CKMB, CKMBINDEX, TROPONINI in the last 168 hours. BNP (last 3 results) No results for input(s): PROBNP in the last 8760 hours. HbA1C: No results for input(s): HGBA1C in the last 72 hours. CBG: Recent Labs  Lab 01/24/20 1115 01/24/20 1640 01/24/20 2002 01/25/20 0813  01/25/20 1205  GLUCAP 110* 104* 68* 94 107*   Lipid Profile: No results for input(s): CHOL, HDL, LDLCALC, TRIG, CHOLHDL, LDLDIRECT in the last 72 hours. Thyroid Function Tests: No results for input(s): TSH, T4TOTAL, FREET4, T3FREE, THYROIDAB in the last 72 hours. Anemia Panel: No results for input(s): VITAMINB12, FOLATE, FERRITIN, TIBC, IRON, RETICCTPCT in the last 72 hours. Sepsis Labs: Recent Labs  Lab 01/24/20 5366 01/24/20 1546 01/24/20 1547 01/24/20 1858 01/25/20 0219 01/25/20 0757 01/25/20 1137 01/26/20 0446  PROCALCITON 136.62  --  109.53  --  70.41  --   --  33.37  LATICACIDVEN  --  2.7*  --  3.1*  --  1.3 1.7  --     Recent Results (from the past 240 hour(s))  Urine culture     Status: Abnormal   Collection Time: 01/23/20 12:13 AM   Specimen: Urine, Random  Result Value Ref Range Status   Specimen Description   Final    URINE, RANDOM Performed at [facility], [address] [place], [place] [address]    Special Requests   Final    NONE Performed at [facility],  [address] [place], [place] [address]    Culture >=100,000 COLONIES/mL ESCHERICHIA COLI (A)  Final   Report Status 01/25/2020 FINAL  Final   Organism ID, Bacteria ESCHERICHIA COLI (A)  Final      Susceptibility   Escherichia coli - MIC*    AMPICILLIN >=32 RESISTANT Resistant     CEFAZOLIN <=4 SENSITIVE Sensitive     CEFEPIME <=0.12 SENSITIVE Sensitive     CEFTRIAXONE <=0.25 SENSITIVE Sensitive     CIPROFLOXACIN <=0.25 SENSITIVE Sensitive     GENTAMICIN <=1 SENSITIVE Sensitive     IMIPENEM <=0.25 SENSITIVE Sensitive     NITROFURANTOIN <=16 SENSITIVE Sensitive     TRIMETH/SULFA >=320 RESISTANT Resistant     AMPICILLIN/SULBACTAM 4 SENSITIVE Sensitive     PIP/TAZO <=4 SENSITIVE Sensitive     * >=100,000 COLONIES/mL ESCHERICHIA COLI  Blood Culture (routine x 2)     Status: Abnormal   Collection Time: 01/23/20 12:13 AM   Specimen: BLOOD  Result Value Ref Range Status   Specimen Description   Final    BLOOD LEFT ANTECUBITAL Performed at [facility], [address] [place], [place] [address]    Special Requests   Final    BOTTLES DRAWN AEROBIC AND ANAEROBIC Blood Culture results may not be optimal due to an inadequate volume of blood received in culture bottles Performed at [facility], [address] [place], [place] [address]    Culture  Setup Time   Final    GRAM NEGATIVE RODS IN BOTH AEROBIC AND ANAEROBIC BOTTLES CRITICAL VALUE NOTED.  VALUE IS CONSISTENT WITH PREVIOUSLY REPORTED AND CALLED VALUE. Performed at [facility], [address] [place], [place] [address]    Culture (A)  Final    ESCHERICHIA COLI SUSCEPTIBILITIES PERFORMED ON PREVIOUS CULTURE WITHIN THE LAST 5 DAYS. Performed at [facility], [place] [address], [place], [place] [address]    Report Status 01/25/2020 FINAL  Final  Blood Culture (routine x 2)     Status: Abnormal   Collection Time: 01/23/20 12:13 AM   Specimen: BLOOD  Result Value Ref Range Status    Specimen Description   Final    BLOOD BLOOD RIGHT FOREARM Performed at [facility], [address], [place],  [address]    Special Requests   Final  BOTTLES DRAWN AEROBIC AND ANAEROBIC Blood Culture results may not be optimal due to an inadequate volume of blood received in culture bottles Performed at [facility], [address] [place], [place] [address]    Culture  Setup Time   Final    GRAM NEGATIVE RODS IN BOTH AEROBIC AND ANAEROBIC BOTTLES CRITICAL RESULT CALLED TO, READ BACK BY AND VERIFIED WITH: M.SLAUGHTER,PHARMD AT 1012 ON 01/23/20 BY GM Performed at [facility], [place] [address], [place], [place] [address]    Culture ESCHERICHIA COLI (A)  Final   Report Status 01/25/2020 FINAL  Final   Organism ID, Bacteria ESCHERICHIA COLI  Final      Susceptibility   Escherichia coli - MIC*    AMPICILLIN >=32 RESISTANT Resistant     CEFAZOLIN <=4 SENSITIVE Sensitive     CEFEPIME <=0.12 SENSITIVE Sensitive     CEFTAZIDIME <=1 SENSITIVE Sensitive     CEFTRIAXONE <=0.25 SENSITIVE Sensitive     CIPROFLOXACIN <=0.25 SENSITIVE Sensitive     GENTAMICIN <=1 SENSITIVE Sensitive     IMIPENEM <=0.25 SENSITIVE Sensitive     TRIMETH/SULFA >=320 RESISTANT Resistant     AMPICILLIN/SULBACTAM 4 SENSITIVE Sensitive     PIP/TAZO <=4 SENSITIVE Sensitive     * ESCHERICHIA COLI  SARS CORONAVIRUS 2 (TAT 6-24 HRS) Nasopharyngeal Nasopharyngeal Swab     Status: None   Collection Time: 01/23/20 12:13 AM   Specimen: Nasopharyngeal Swab  Result Value Ref Range Status   SARS Coronavirus 2 NEGATIVE NEGATIVE Final    Comment: (NOTE) SARS-CoV-2 target nucleic acids are NOT DETECTED.  The SARS-CoV-2 RNA is generally detectable in upper and lower respiratory specimens during the acute phase of infection. Negative results do not preclude SARS-CoV-2 infection, do not rule out co-infections with other pathogens, and should not be used as the sole basis for treatment or other  patient management decisions. Negative results must be combined with clinical observations, patient history, and epidemiological information. The expected result is Negative.  Fact Sheet for Patients: SugarRoll.be  Fact Sheet for Healthcare Providers: https://www.woods-mathews.com/  This test is not yet approved or cleared by the [place] FDA and  has been authorized for detection and/or diagnosis of SARS-CoV-2 by FDA under an Emergency Use Authorization (EUA). This EUA will remain  in effect (meaning this test can be used) for the duration of the COVID-19 declaration under Se ction 564(b)(1) of the Act, 21 U.S.C. section 360bbb-3(b)(1), unless the authorization is terminated or revoked sooner.  Performed at [facility], [place] [address] [place], [place] [address]   Blood Culture ID Panel (Reflexed)     Status: Abnormal   Collection Time: 01/23/20 12:13 AM  Result Value Ref Range Status   Enterococcus faecalis NOT DETECTED NOT DETECTED Final   Enterococcus Faecium NOT DETECTED NOT DETECTED Final   Listeria monocytogenes NOT DETECTED NOT DETECTED Final   Staphylococcus species NOT DETECTED NOT DETECTED Final   Staphylococcus aureus (BCID) NOT DETECTED NOT DETECTED Final   Staphylococcus epidermidis NOT DETECTED NOT DETECTED Final   Staphylococcus lugdunensis NOT DETECTED NOT DETECTED Final   Streptococcus species NOT DETECTED NOT DETECTED Final   Streptococcus agalactiae NOT DETECTED NOT DETECTED Final   Streptococcus pneumoniae NOT DETECTED NOT DETECTED Final   Streptococcus pyogenes NOT DETECTED NOT DETECTED Final   A.calcoaceticus-baumannii NOT DETECTED NOT DETECTED Final   Bacteroides fragilis NOT DETECTED NOT DETECTED Final   Enterobacterales DETECTED (A) NOT DETECTED Final    Comment: Enterobacterales represent a large  order of gram negative bacteria, not a single organism. CRITICAL RESULT CALLED TO, READ BACK BY AND  VERIFIED WITH: M.SLAUGHTER,PHARMD AT 1012 ON 01/23/20 BY GM    Enterobacter cloacae complex NOT DETECTED NOT DETECTED Final   Escherichia coli DETECTED (A) NOT DETECTED Final    Comment: CRITICAL RESULT CALLED TO, READ BACK BY AND VERIFIED WITH: M.SLAUGHTER,PHARMD AT 1012 ON 01/23/20 BY GM    Klebsiella aerogenes NOT DETECTED NOT DETECTED Final   Klebsiella oxytoca NOT DETECTED NOT DETECTED Final   Klebsiella pneumoniae NOT DETECTED NOT DETECTED Final   Proteus species NOT DETECTED NOT DETECTED Final   Salmonella species NOT DETECTED NOT DETECTED Final   Serratia marcescens NOT DETECTED NOT DETECTED Final   Haemophilus influenzae NOT DETECTED NOT DETECTED Final   Neisseria meningitidis NOT DETECTED NOT DETECTED Final   Pseudomonas aeruginosa NOT DETECTED NOT DETECTED Final   Stenotrophomonas maltophilia NOT DETECTED NOT DETECTED Final   Candida albicans NOT DETECTED NOT DETECTED Final   Candida auris NOT DETECTED NOT DETECTED Final   Candida glabrata NOT DETECTED NOT DETECTED Final   Candida krusei NOT DETECTED NOT DETECTED Final   Candida parapsilosis NOT DETECTED NOT DETECTED Final   Candida tropicalis NOT DETECTED NOT DETECTED Final   Cryptococcus neoformans/gattii NOT DETECTED NOT DETECTED Final   CTX-M ESBL NOT DETECTED NOT DETECTED Final   Carbapenem resistance IMP NOT DETECTED NOT DETECTED Final   Carbapenem resistance KPC NOT DETECTED NOT DETECTED Final   Carbapenem resistance NDM NOT DETECTED NOT DETECTED Final   Carbapenem resist OXA 48 LIKE NOT DETECTED NOT DETECTED Final   Carbapenem resistance VIM NOT DETECTED NOT DETECTED Final    Comment: Performed at [facility], [address] [place], [place] [address]  Urine Culture     Status: Abnormal (Preliminary result)   Collection Time: 01/23/20  4:04 AM   Specimen: Urine, Random  Result Value Ref Range Status   Specimen Description   Final    URINE, RANDOM Performed at [facility], [address], [place], [place] [address]    Special Requests   Final    NONE Performed at [facility], [address] [place], [place] [address]    Culture (A)  Final    50,000 COLONIES/mL PROTEUS MIRABILIS >=100,000 COLONIES/mL ESCHERICHIA COLI CULTURE REINCUBATED FOR BETTER GROWTH Performed at [facility], [address] [place], [place] [address]    Report Status PENDING  Incomplete   Organism ID, Bacteria PROTEUS MIRABILIS (A)  Final   Organism ID, Bacteria ESCHERICHIA COLI (A)  Final      Susceptibility   Escherichia coli - MIC*    AMPICILLIN >=32 RESISTANT Resistant     CEFAZOLIN <=4 SENSITIVE Sensitive     CEFEPIME <=0.12 SENSITIVE Sensitive     CEFTRIAXONE <=0.25 SENSITIVE Sensitive     CIPROFLOXACIN <=0.25 SENSITIVE Sensitive     GENTAMICIN <=1 SENSITIVE Sensitive     IMIPENEM <=0.25 SENSITIVE Sensitive     NITROFURANTOIN <=16 SENSITIVE Sensitive     TRIMETH/SULFA >=320 RESISTANT Resistant     AMPICILLIN/SULBACTAM 4 SENSITIVE Sensitive     PIP/TAZO <=4 SENSITIVE Sensitive     * >=100,000 COLONIES/mL ESCHERICHIA COLI   Proteus mirabilis - MIC*    AMPICILLIN <=2 SENSITIVE Sensitive     CEFAZOLIN <=4 SENSITIVE Sensitive     CEFEPIME <=0.12 SENSITIVE Sensitive     CEFTRIAXONE <=0.25 SENSITIVE Sensitive     CIPROFLOXACIN <=0.25 SENSITIVE Sensitive     GENTAMICIN <=  1 SENSITIVE Sensitive     IMIPENEM 8 INTERMEDIATE Intermediate     NITROFURANTOIN RESISTANT Resistant     TRIMETH/SULFA <=20 SENSITIVE Sensitive     AMPICILLIN/SULBACTAM <=2 SENSITIVE Sensitive     PIP/TAZO <=4 SENSITIVE Sensitive     * 50,000 COLONIES/mL PROTEUS MIRABILIS  MRSA PCR Screening     Status: None   Collection Time: 01/23/20  5:23 AM   Specimen: Nasopharyngeal  Result Value Ref Range Status   MRSA by PCR NEGATIVE NEGATIVE Final    Comment:        The GeneXpert MRSA Assay (FDA approved for NASAL specimens only), is one component of a comprehensive MRSA colonization surveillance  program. It is not intended to diagnose MRSA infection nor to guide or monitor treatment for MRSA infections. Performed at [facility], [address] [place], [place] [address]       Radiology Studies: No results found.  Scheduled Meds: . atorvastatin  10 mg Oral Daily  . calcitRIOL  0.5 mcg Oral QHS  . [START ON 01/30/2020] cephALEXin  500 mg Oral Q8H  . Chlorhexidine Gluconate Cloth  6 each Topical Q0600  . donepezil  5 mg Oral QHS  . isosorbide dinitrate  5 mg Oral Daily  . warfarin  2 mg Oral ONCE-1600  . Warfarin - Pharmacist Dosing Inpatient   Does not apply q1600   Continuous Infusions: . sodium chloride 250 mL (01/23/20 1338)  .  ceFAZolin (ANCEF) IV 1 g (01/27/20 0919)     LOS: 4 days   Time spent: 35 minutes  Lexxie Winberg Loann Quill, MD [facility]  If 7PM-7AM, please contact night-coverage [email or web address] 01/27/2020, 11:30 AM

## 2020-01-27 NOTE — Plan of Care (Signed)
Pt non verbal, jerking movement observed this shift and PRN ativan given per order with relief. Falls precautions remained in place Problem: Health Behavior/Discharge Planning: Goal: Ability to manage health-related needs will improve Outcome: Progressing   Problem: Coping: Goal: Level of anxiety will decrease Outcome: Progressing   Problem: Safety: Goal: Ability to remain free from injury will improve Outcome: Progressing

## 2020-01-28 DIAGNOSIS — A4151 Sepsis due to Escherichia coli [E. coli]: Secondary | ICD-10-CM | POA: Diagnosis not present

## 2020-01-28 DIAGNOSIS — N390 Urinary tract infection, site not specified: Secondary | ICD-10-CM | POA: Diagnosis not present

## 2020-01-28 DIAGNOSIS — R6520 Severe sepsis without septic shock: Secondary | ICD-10-CM | POA: Diagnosis not present

## 2020-01-28 DIAGNOSIS — N171 Acute kidney failure with acute cortical necrosis: Secondary | ICD-10-CM | POA: Diagnosis not present

## 2020-01-28 DIAGNOSIS — F0390 Unspecified dementia without behavioral disturbance: Secondary | ICD-10-CM | POA: Diagnosis not present

## 2020-01-28 DIAGNOSIS — R410 Disorientation, unspecified: Secondary | ICD-10-CM

## 2020-01-28 DIAGNOSIS — R7881 Bacteremia: Secondary | ICD-10-CM | POA: Diagnosis not present

## 2020-01-28 DIAGNOSIS — B9620 Unspecified Escherichia coli [E. coli] as the cause of diseases classified elsewhere: Secondary | ICD-10-CM | POA: Diagnosis not present

## 2020-01-28 LAB — CBC
HCT: 30.9 % — ABNORMAL LOW (ref 36.0–46.0)
Hemoglobin: 10.3 g/dL — ABNORMAL LOW (ref 12.0–15.0)
MCH: 30.4 pg (ref 26.0–34.0)
MCHC: 33.3 g/dL (ref 30.0–36.0)
MCV: 91.2 fL (ref 80.0–100.0)
Platelets: 192 10*3/uL (ref 150–400)
RBC: 3.39 MIL/uL — ABNORMAL LOW (ref 3.87–5.11)
RDW: 14.9 % (ref 11.5–15.5)
WBC: 6.5 10*3/uL (ref 4.0–10.5)
nRBC: 0 % (ref 0.0–0.2)

## 2020-01-28 LAB — COMPREHENSIVE METABOLIC PANEL
ALT: 19 U/L (ref 0–44)
AST: 39 U/L (ref 15–41)
Albumin: 2.4 g/dL — ABNORMAL LOW (ref 3.5–5.0)
Alkaline Phosphatase: 50 U/L (ref 38–126)
Anion gap: 8 (ref 5–15)
BUN: 33 mg/dL — ABNORMAL HIGH (ref 8–23)
CO2: 25 mmol/L (ref 22–32)
Calcium: 9 mg/dL (ref 8.9–10.3)
Chloride: 113 mmol/L — ABNORMAL HIGH (ref 98–111)
Creatinine, Ser: 2.13 mg/dL — ABNORMAL HIGH (ref 0.44–1.00)
GFR, Estimated: 22 mL/min — ABNORMAL LOW (ref >60)
Glucose, Bld: 112 mg/dL — ABNORMAL HIGH (ref 70–99)
Potassium: 3.9 mmol/L (ref 3.5–5.1)
Sodium: 146 mmol/L — ABNORMAL HIGH (ref 135–145)
Total Bilirubin: 0.6 mg/dL (ref 0.3–1.2)
Total Protein: 5.9 g/dL — ABNORMAL LOW (ref 6.5–8.1)

## 2020-01-28 LAB — PROTIME-INR
INR: 3 — ABNORMAL HIGH (ref 0.8–1.2)
Prothrombin Time: 30.4 seconds — ABNORMAL HIGH (ref 11.4–15.2)

## 2020-01-28 MED ORDER — ENSURE ENLIVE PO LIQD
237.0000 mL | Freq: Three times a day (TID) | ORAL | Status: DC
  Administered 2020-01-28 – 2020-02-06 (×23): 237 mL via ORAL

## 2020-01-28 MED ORDER — ADULT MULTIVITAMIN W/MINERALS CH
1.0000 | ORAL_TABLET | Freq: Every day | ORAL | Status: DC
  Administered 2020-01-29 – 2020-02-04 (×7): 1 via ORAL
  Filled 2020-01-28 (×8): qty 1

## 2020-01-28 NOTE — Progress Notes (Signed)
Initial Nutrition Assessment  DOCUMENTATION CODES:   Not applicable  INTERVENTION:  Initiate 48 hour calorie count.  Provide Ensure Enlive po TID, each supplement provides 350 kcal and 20 grams of protein.  Provide Magic cup BID with lunch and dinner, each supplement provides 290 kcal and 9 grams of protein.  Can decrease oral nutrition supplements as PO intake improves.  Provide MVI po daily.  NUTRITION DIAGNOSIS:   Inadequate oral intake related to decreased appetite as evidenced by per patient/family report,meal completion < 50%.  GOAL:   Patient will meet greater than or equal to 90% of their needs  MONITOR:   PO intake,Supplement acceptance,Diet advancement,Labs,Weight trends,I & O's  REASON FOR ASSESSMENT:   Consult Assessment of nutrition requirement/status,Poor PO,Calorie Count  ASSESSMENT:   87 year old female with PMHx of Alzheimer's dementia, HLD, CVA, CKD stage IV, HTN, pulmonary embolism, A-fib, hx pacemaker placement, DVT admitted with sepsis in setting of obstructive uropathy/complicated E coli UTI, E coli/enterobacterales bacteremia, AKI on CKD IV.   Met with patient and her niece/caregiver at bedside. Niece provides history as she reports patient is mostly nonverbal today. She reports at home patient has a good appetite and intake. She eats 3 meals per day, plus 2 bottles of Ensure original, and snacks between meals. For breakfast she may have oatmeal with eggs. Other meals she may have peanut butter with jelly, salisbury steak, mashed potatoes, cut up apples, popcorn. Patient is now on a dysphagia 1 diet following SLP evaluation on 1/8. Niece reports patient does not like this diet very much. She is eating 0-25% of her meals per chart. Today she has only had Magic Cup and applesauce. She reports patient enjoys [employer] and also enjoys Ensure and would be willing to drink these.  Niece reports that patient's weight has remained fairly stable over time. She  has only lost approximately 10 lbs over the past 3 years, which is not significant for time frame. Patient currently 63.2 kg (139.33 lbs).  Medications reviewed and include: warfarin, cefazolin.  Labs reviewed: Sodium 146, Chloride 113, BUN 33, Creatinine 2.13.  Discussed with RN.  Patient is at risk for development of malnutrition.  NUTRITION - FOCUSED PHYSICAL EXAM:  Flowsheet Row Most Recent Value  Orbital Region No depletion  Upper Arm Region Mild depletion  Thoracic and Lumbar Region No depletion  Buccal Region No depletion  Temple Region Mild depletion  Clavicle Bone Region No depletion  Clavicle and Acromion Bone Region No depletion  Scapular Bone Region No depletion  Dorsal Hand Severe depletion  Patellar Region Mild depletion  Anterior Thigh Region Moderate depletion  Posterior Calf Region Moderate depletion  Edema (RD Assessment) None  Hair Reviewed  Eyes Reviewed  Mouth Reviewed  Skin Reviewed  Nails Reviewed     Diet Order:   Diet Order            DIET - DYS 1 Room service appropriate? Yes; Fluid consistency: Thin  Diet effective now                EDUCATION NEEDS:   No education needs have been identified at this time  Skin:  Skin Assessment: Skin Integrity Issues: Skin Integrity Issues:: Incisions Incisions: closed incision perineum  Last BM:  01/27/2019 - type 6  Height:   Ht Readings from Last 1 Encounters:  01/23/20 _0  (1.626 m)   Weight:   Wt Readings from Last 1 Encounters:  01/27/20 63.2 kg   BMI:  Body mass  index is 23.92 kg/m.  Estimated Nutritional Needs:   Kcal:  1600-1800  Protein:  80-90 grams  Fluid:  1.6 L/day  Jacklynn Barnacle, MS, RD, LDN Pager number available on Amion

## 2020-01-28 NOTE — Progress Notes (Signed)
PROGRESS NOTE    Hailey Lopez  TGY:[id number] DOB: [birth year] DOA: 01/22/2020 PCP: Marda Stalker, PA-C   Brief Narrative:  Patient is 87 year old female with past medical history of A. fib-on Coumadin, CKD stage IV, hypertension, hyperlipidemia, CVA, Alzheimer's dementia presents to emergency department with vomiting, generalized weakness, one episode of diarrhea and syncope.  ED course: Patient afebrile, v BP: 145/108, pulse: 136, EKG shows sinus tachycardia and nonspecific ST-T wave changes.  Sodium 146, bicarb 20, anion gap 16, BUN 44, creatinine: 3.45, lactic acid 8.1, procalcitonin 80.51, WBC 11.7, INR: 2.1.  UA is consistent with UTI.  Chest x-ray negative for acute findings.  CT head is negative.  CT abdomen and pelvis showed mild left hydronephrosis with perinephric stranding due to obstructing 15 mm calculus in the left Vitro pelvic junction.  She met sepsis criteria therefore she was given 2 L of IV fluid bolus and started on cefepime.  Urology was consulted.  She tested negative for COVID-19.  Patient admitted under PCCM.  [facility] took over care from 01/23/2018  Assessment & Plan:   Severe sepsis in the setting of obstructive uropathy/complicated Ecoli UTI, E. coli/Enterobacterales bacteremia: -Status post left ureteral stent placement by urology.  POD 4. -Urology consulted- discontinue Foley catheter -Follow-up with urology outpatient to discuss definitive stone management. -Patient is afebrile.  Leukocytosis: Resolved.  Procalcitonin level: 80.51-136-109-70.41-33.37. -Continue cefazolin.  She will need total of 14 days of antibiotics (01/29/2020)-switch to p.o. Keflex 500 mg every 8 hours to complete 14 days course.  Last day of therapy would be on 02/06/2020.  Appreciate IDs recommendation.  AKI on CKD stage IV: -Baseline creatinine: 1.95.  On admission: Creatinine: 3.87, GFR: 11. -In the setting of obstructive uropathy.  Renal function slowly improving. -Nephrology  consulted-no indication for dialysis.  Not a candidate for long-term dialysis at this time.    Elevated troponin: -Troponin 214 trended up to 764-819.  EKG: No acute ST-T wave changes noted.  Likely in the setting of demand ischemia in the setting of sepsis -Continue to monitor.  Patient denies any ACS symptoms.  Jerking movements: -Unknown etiology?.  Improving as per RN & the family.  Reviewed meds with pharmacy.  Vitals remained stable.  Could be secondary to pain/discomfort due to purewick.  Continue oxycodone and Ativan as needed. -EEG is pending  History of PE and DVT: Continue Coumadin as per pharmacy.  Monitor PT/INR  Hyperlipidemia: Continue atorvastatin  Alzheimer's dementia: Continue donepezil  Hypertension: Blood pressure in 140s/70s.  Continue Isordil -Hydralazine as needed for blood pressure more than 160/100. -Monitor blood pressure closely  Hyponatremia: Mild and improving. From one 49-1 forty-six this morning. Repeat BMP tomorrow a.m.  Thrombocytopenia: Platelet 120-121-148-192.  No signs of active bleeding.   Debility: We will consult PT/OT  DVT prophylaxis: Coumadin Code Status: DNR Family Communication: None present at bedside.  Plan of care discussed with patient in length and she verbalized understanding and agreed with it.  Disposition Plan: To be determined  Consultants:   PCCM  ID  Nephrology  urology  Procedures:   Left ureteral stent placement  Antimicrobials:   Rocephin  Cefazolin  Status is: Inpatient   Dispo: The patient is from: Home              Anticipated d/c is to: SNF              Anticipated d/c date is: 3 days              Patient  currently is not medically stable to d/c.    Subjective: Patient seen and examined. No family at the bedside. Alert and following commands-appears slightly confused. Remained afebrile. Her jerking movement has improved. No acute events overnight.  Objective: Vitals:   01/27/20 2016  01/27/20 2246 01/28/20 0400 01/28/20 0749  BP:  (!) 168/70  (!) 89/77  Pulse: 96  67 64  Resp: _0 Temp: 99.2 F (37.3 C)  98.2 F (36.8 C) 97.8 F (36.6 C)  TempSrc: Oral     SpO2: 99%  100% 99%  Weight:      Height:        Intake/Output Summary (Last 24 hours) at 01/28/2020 1145 Last data filed at 01/28/2020 1056 Gross per 24 hour  Intake 340 ml  Output 850 ml  Net -510 ml   Filed Weights   01/26/20 0535 01/27/20 0035 01/27/20 0613  Weight: 61.6 kg 63.8 kg 63.2 kg    Examination:  General exam: Appears calm and comfortable, elderly, on room air, alert and following commands.   Respiratory system: Clear to auscultation. Respiratory effort normal. Cardiovascular system: S1 & S2 heard, RRR. No JVD, murmurs, rubs, gallops or clicks. No pedal edema. Gastrointestinal system: Abdomen is nondistended, soft and nontender. No organomegaly or masses felt. Normal bowel sounds heard.  Has Foley. Central nervous system: Alert, awake and following commands. Extremities: Symmetric 5 x 5 power. Skin: No rashes, lesions or ulcers   Data Reviewed: I have personally reviewed following labs and imaging studies  CBC: Recent Labs  Lab 01/24/20 0643 01/25/20 0219 01/26/20 0446 01/27/20 0428 01/28/20 0238  WBC 15.2* 11.7* 8.1 7.4 6.5  NEUTROABS 13.0* 9.5* 5.7  --   --   HGB 11.7* 10.6* 10.4* 10.4* 10.3*  HCT 34.3* 32.0* 31.6* 31.7* 30.9*  MCV 91.2 92.5 92.9 92.2 91.2  PLT 117* 120* 121* 148* 017   Basic Metabolic Panel: Recent Labs  Lab 01/24/20 0643 01/25/20 0219 01/26/20 0446 01/27/20 0428 01/28/20 0238  NA 143 145 149* 148* 146*  K 3.6 3.5 3.7 3.8 3.9  CL 107 113* 118* 118* 113*  CO2 _1 GLUCOSE 136* 171* 141* 120* 112*  BUN 48* 42* 35* 31* 33*  CREATININE 3.87* 3.22* 2.53* 2.30* 2.13*  CALCIUM 9.1 9.2 9.2 9.1 9.0  MG  --   --   --  1.8  --    GFR: Estimated Creatinine Clearance: 15.8 mL/min (A) (by C-G formula based on SCr of 2.13 mg/dL  (H)). Liver Function Tests: Recent Labs  Lab 01/24/20 0643 01/25/20 0219 01/26/20 0446 01/27/20 0428 01/28/20 0238  AST 55* 44* 40 33 39  ALT _2 ALKPHOS 57 61 53 50 50  BILITOT 0.7 0.6 0.5 0.8 0.6  PROT 5.8* 5.7* 5.4* 5.6* 5.9*  ALBUMIN 2.4* 2.2* 2.2* 2.2* 2.4*   Recent Labs  Lab 01/22/20 2330  LIPASE 69*   No results for input(s): AMMONIA in the last 168 hours. Coagulation Profile: Recent Labs  Lab 01/24/20 0643 01/25/20 0219 01/26/20 0446 01/27/20 0428 01/28/20 0238  INR 3.0* 2.6* 2.6* 2.9* 3.0*   Cardiac Enzymes: No results for input(s): CKTOTAL, CKMB, CKMBINDEX, TROPONINI in the last 168 hours. BNP (last 3 results) No results for input(s): PROBNP in the last 8760 hours. HbA1C: No results for input(s): HGBA1C in the last 72 hours. CBG: Recent Labs  Lab 01/24/20 1115 01/24/20 1640 01/24/20 2002 01/25/20 0813 01/25/20 1205  GLUCAP 110* 104*  68* 94 107*   Lipid Profile: No results for input(s): CHOL, HDL, LDLCALC, TRIG, CHOLHDL, LDLDIRECT in the last 72 hours. Thyroid Function Tests: No results for input(s): TSH, T4TOTAL, FREET4, T3FREE, THYROIDAB in the last 72 hours. Anemia Panel: No results for input(s): VITAMINB12, FOLATE, FERRITIN, TIBC, IRON, RETICCTPCT in the last 72 hours. Sepsis Labs: Recent Labs  Lab 01/24/20 0263 01/24/20 1546 01/24/20 1547 01/24/20 1858 01/25/20 0219 01/25/20 0757 01/25/20 1137 01/26/20 0446  PROCALCITON 136.62  --  109.53  --  70.41  --   --  33.37  LATICACIDVEN  --  2.7*  --  3.1*  --  1.3 1.7  --     Recent Results (from the past 240 hour(s))  Urine culture     Status: Abnormal   Collection Time: 01/23/20 12:13 AM   Specimen: Urine, Random  Result Value Ref Range Status   Specimen Description   Final    URINE, RANDOM Performed at [facility], [address] [place], [place] [address]    Special Requests   Final    NONE Performed at [facility], [address]  [place], [place] [address]    Culture >=100,000 COLONIES/mL ESCHERICHIA COLI (A)  Final   Report Status 01/25/2020 FINAL  Final   Organism ID, Bacteria ESCHERICHIA COLI (A)  Final      Susceptibility   Escherichia coli - MIC*    AMPICILLIN >=32 RESISTANT Resistant     CEFAZOLIN <=4 SENSITIVE Sensitive     CEFEPIME <=0.12 SENSITIVE Sensitive     CEFTRIAXONE <=0.25 SENSITIVE Sensitive     CIPROFLOXACIN <=0.25 SENSITIVE Sensitive     GENTAMICIN <=1 SENSITIVE Sensitive     IMIPENEM <=0.25 SENSITIVE Sensitive     NITROFURANTOIN <=16 SENSITIVE Sensitive     TRIMETH/SULFA >=320 RESISTANT Resistant     AMPICILLIN/SULBACTAM 4 SENSITIVE Sensitive     PIP/TAZO <=4 SENSITIVE Sensitive     * >=100,000 COLONIES/mL ESCHERICHIA COLI  Blood Culture (routine x 2)     Status: Abnormal   Collection Time: 01/23/20 12:13 AM   Specimen: BLOOD  Result Value Ref Range Status   Specimen Description   Final    BLOOD LEFT ANTECUBITAL Performed at [facility], [address] [place], [place] [address]    Special Requests   Final    BOTTLES DRAWN AEROBIC AND ANAEROBIC Blood Culture results may not be optimal due to an inadequate volume of blood received in culture bottles Performed at [facility], [address] [place], [place] [address]    Culture  Setup Time   Final    GRAM NEGATIVE RODS IN BOTH AEROBIC AND ANAEROBIC BOTTLES CRITICAL VALUE NOTED.  VALUE IS CONSISTENT WITH PREVIOUSLY REPORTED AND CALLED VALUE. Performed at [facility], [address] [place], [place] [address]    Culture (A)  Final    ESCHERICHIA COLI SUSCEPTIBILITIES PERFORMED ON PREVIOUS CULTURE WITHIN THE LAST 5 DAYS. Performed at [facility], [place] [address], [place], [place] [address]    Report Status 01/25/2020 FINAL  Final  Blood Culture (routine x 2)     Status: Abnormal   Collection Time: 01/23/20 12:13 AM   Specimen: BLOOD  Result Value Ref Range Status   Specimen Description   Final     BLOOD BLOOD RIGHT FOREARM Performed at [facility], [address] [place],  [address]    Special Requests   Final    BOTTLES DRAWN AEROBIC AND ANAEROBIC  Blood Culture results may not be optimal due to an inadequate volume of blood received in culture bottles Performed at [facility], [address] [place], [place] [address]    Culture  Setup Time   Final    GRAM NEGATIVE RODS IN BOTH AEROBIC AND ANAEROBIC BOTTLES CRITICAL RESULT CALLED TO, READ BACK BY AND VERIFIED WITH: M.SLAUGHTER,PHARMD AT 1012 ON 01/23/20 BY GM Performed at [facility], [place] [address], [place], [place] [address]    Culture ESCHERICHIA COLI (A)  Final   Report Status 01/25/2020 FINAL  Final   Organism ID, Bacteria ESCHERICHIA COLI  Final      Susceptibility   Escherichia coli - MIC*    AMPICILLIN >=32 RESISTANT Resistant     CEFAZOLIN <=4 SENSITIVE Sensitive     CEFEPIME <=0.12 SENSITIVE Sensitive     CEFTAZIDIME <=1 SENSITIVE Sensitive     CEFTRIAXONE <=0.25 SENSITIVE Sensitive     CIPROFLOXACIN <=0.25 SENSITIVE Sensitive     GENTAMICIN <=1 SENSITIVE Sensitive     IMIPENEM <=0.25 SENSITIVE Sensitive     TRIMETH/SULFA >=320 RESISTANT Resistant     AMPICILLIN/SULBACTAM 4 SENSITIVE Sensitive     PIP/TAZO <=4 SENSITIVE Sensitive     * ESCHERICHIA COLI  SARS CORONAVIRUS 2 (TAT 6-24 HRS) Nasopharyngeal Nasopharyngeal Swab     Status: None   Collection Time: 01/23/20 12:13 AM   Specimen: Nasopharyngeal Swab  Result Value Ref Range Status   SARS Coronavirus 2 NEGATIVE NEGATIVE Final    Comment: (NOTE) SARS-CoV-2 target nucleic acids are NOT DETECTED.  The SARS-CoV-2 RNA is generally detectable in upper and lower respiratory specimens during the acute phase of infection. Negative results do not preclude SARS-CoV-2 infection, do not rule out co-infections with other pathogens, and should not be used as the sole basis for treatment or other patient management  decisions. Negative results must be combined with clinical observations, patient history, and epidemiological information. The expected result is Negative.  Fact Sheet for Patients: SugarRoll.be  Fact Sheet for Healthcare Providers: https://www.woods-mathews.com/  This test is not yet approved or cleared by the [place] FDA and  has been authorized for detection and/or diagnosis of SARS-CoV-2 by FDA under an Emergency Use Authorization (EUA). This EUA will remain  in effect (meaning this test can be used) for the duration of the COVID-19 declaration under Se ction 564(b)(1) of the Act, 21 U.S.C. section 360bbb-3(b)(1), unless the authorization is terminated or revoked sooner.  Performed at [facility], [place] [address] [place],  [address]   Blood Culture ID Panel (Reflexed)     Status: Abnormal   Collection Time: 01/23/20 12:13 AM  Result Value Ref Range Status   Enterococcus faecalis NOT DETECTED NOT DETECTED Final   Enterococcus Faecium NOT DETECTED NOT DETECTED Final   Listeria monocytogenes NOT DETECTED NOT DETECTED Final   Staphylococcus species NOT DETECTED NOT DETECTED Final   Staphylococcus aureus (BCID) NOT DETECTED NOT DETECTED Final   Staphylococcus epidermidis NOT DETECTED NOT DETECTED Final   Staphylococcus lugdunensis NOT DETECTED NOT DETECTED Final   Streptococcus species NOT DETECTED NOT DETECTED Final   Streptococcus agalactiae NOT DETECTED NOT DETECTED Final   Streptococcus pneumoniae NOT DETECTED NOT DETECTED Final   Streptococcus pyogenes NOT DETECTED NOT DETECTED Final   A.calcoaceticus-baumannii NOT DETECTED NOT DETECTED Final   Bacteroides fragilis NOT DETECTED NOT DETECTED Final   Enterobacterales DETECTED (A) NOT DETECTED Final    Comment: Enterobacterales represent a large order of gram negative bacteria,  not a single organism. CRITICAL RESULT CALLED TO, READ BACK BY AND VERIFIED  WITH: M.SLAUGHTER,PHARMD AT 1012 ON 01/23/20 BY GM    Enterobacter cloacae complex NOT DETECTED NOT DETECTED Final   Escherichia coli DETECTED (A) NOT DETECTED Final    Comment: CRITICAL RESULT CALLED TO, READ BACK BY AND VERIFIED WITH: M.SLAUGHTER,PHARMD AT 1012 ON 01/23/20 BY GM    Klebsiella aerogenes NOT DETECTED NOT DETECTED Final   Klebsiella oxytoca NOT DETECTED NOT DETECTED Final   Klebsiella pneumoniae NOT DETECTED NOT DETECTED Final   Proteus species NOT DETECTED NOT DETECTED Final   Salmonella species NOT DETECTED NOT DETECTED Final   Serratia marcescens NOT DETECTED NOT DETECTED Final   Haemophilus influenzae NOT DETECTED NOT DETECTED Final   Neisseria meningitidis NOT DETECTED NOT DETECTED Final   Pseudomonas aeruginosa NOT DETECTED NOT DETECTED Final   Stenotrophomonas maltophilia NOT DETECTED NOT DETECTED Final   Candida albicans NOT DETECTED NOT DETECTED Final   Candida auris NOT DETECTED NOT DETECTED Final   Candida glabrata NOT DETECTED NOT DETECTED Final   Candida krusei NOT DETECTED NOT DETECTED Final   Candida parapsilosis NOT DETECTED NOT DETECTED Final   Candida tropicalis NOT DETECTED NOT DETECTED Final   Cryptococcus neoformans/gattii NOT DETECTED NOT DETECTED Final   CTX-M ESBL NOT DETECTED NOT DETECTED Final   Carbapenem resistance IMP NOT DETECTED NOT DETECTED Final   Carbapenem resistance KPC NOT DETECTED NOT DETECTED Final   Carbapenem resistance NDM NOT DETECTED NOT DETECTED Final   Carbapenem resist OXA 48 LIKE NOT DETECTED NOT DETECTED Final   Carbapenem resistance VIM NOT DETECTED NOT DETECTED Final    Comment: Performed at [facility], [address] [place], [place] [address]  Urine Culture     Status: Abnormal   Collection Time: 01/23/20  4:04 AM   Specimen: Urine, Random  Result Value Ref Range Status   Specimen Description   Final    URINE, RANDOM Performed at [facility], [address] [place], [place] [address]     Special Requests   Final    NONE Performed at [facility], [address] [place], [place] [address]    Culture (A)  Final    50,000 COLONIES/mL PROTEUS MIRABILIS >=100,000 COLONIES/mL ESCHERICHIA COLI    Report Status 01/27/2020 FINAL  Final   Organism ID, Bacteria PROTEUS MIRABILIS (A)  Final   Organism ID, Bacteria ESCHERICHIA COLI (A)  Final      Susceptibility   Escherichia coli - MIC*    AMPICILLIN >=32 RESISTANT Resistant     CEFAZOLIN <=4 SENSITIVE Sensitive     CEFEPIME <=0.12 SENSITIVE Sensitive     CEFTRIAXONE <=0.25 SENSITIVE Sensitive     CIPROFLOXACIN <=0.25 SENSITIVE Sensitive     GENTAMICIN <=1 SENSITIVE Sensitive     IMIPENEM <=0.25 SENSITIVE Sensitive     NITROFURANTOIN <=16 SENSITIVE Sensitive     TRIMETH/SULFA >=320 RESISTANT Resistant     AMPICILLIN/SULBACTAM 4 SENSITIVE Sensitive     PIP/TAZO <=4 SENSITIVE Sensitive     * >=100,000 COLONIES/mL ESCHERICHIA COLI   Proteus mirabilis - MIC*    AMPICILLIN <=2 SENSITIVE Sensitive     CEFAZOLIN <=4 SENSITIVE Sensitive     CEFEPIME <=0.12 SENSITIVE Sensitive     CEFTRIAXONE <=0.25 SENSITIVE Sensitive     CIPROFLOXACIN <=0.25 SENSITIVE Sensitive     GENTAMICIN <=1 SENSITIVE Sensitive     IMIPENEM 8 INTERMEDIATE Intermediate     NITROFURANTOIN RESISTANT Resistant     TRIMETH/SULFA <=20  SENSITIVE Sensitive     AMPICILLIN/SULBACTAM <=2 SENSITIVE Sensitive     PIP/TAZO <=4 SENSITIVE Sensitive     * 50,000 COLONIES/mL PROTEUS MIRABILIS  MRSA PCR Screening     Status: None   Collection Time: 01/23/20  5:23 AM   Specimen: Nasopharyngeal  Result Value Ref Range Status   MRSA by PCR NEGATIVE NEGATIVE Final    Comment:        The GeneXpert MRSA Assay (FDA approved for NASAL specimens only), is one component of a comprehensive MRSA colonization surveillance program. It is not intended to diagnose MRSA infection nor to guide or monitor treatment for MRSA infections. Performed at [facility],  [address] [place], [place] [address]       Radiology Studies: No results found.  Scheduled Meds: . atorvastatin  10 mg Oral Daily  . calcitRIOL  0.5 mcg Oral QHS  . [START ON 01/30/2020] cephALEXin  500 mg Oral Q8H  . Chlorhexidine Gluconate Cloth  6 each Topical Q0600  . donepezil  5 mg Oral QHS  . isosorbide dinitrate  5 mg Oral Daily  . Warfarin - Pharmacist Dosing Inpatient   Does not apply q1600   Continuous Infusions: . sodium chloride 250 mL (01/23/20 1338)  .  ceFAZolin (ANCEF) IV 1 g (01/28/20 0941)     LOS: 5 days   Time spent: 35 minutes  Lloyde Ludlam Loann Quill, MD [facility]  If 7PM-7AM, please contact night-coverage [email or web address] 01/28/2020, 11:45 AM

## 2020-01-28 NOTE — Progress Notes (Signed)
OT Cancellation Note  Patient Details Name: Hailey Lopez MRN: [id number] DOB: July 14, 1929   Cancelled Treatment:    Reason Eval/Treat Not Completed: Other (comment). Consult received, chart reviewed. Pt working with PT upon attempt. Will re-attempt next date as medically appropriate.   Jeni Salles, MPH, MS, OTR/L ascom [phone] 01/28/20, 4:22 PM

## 2020-01-28 NOTE — Progress Notes (Signed)
ANTICOAGULATION CONSULT NOTE  Pharmacy Consult for warfarin Indication: atrial fibrillation   Labs: Recent Labs    01/26/20 0446 01/27/20 0428 01/28/20 0238  HGB 10.4* 10.4* 10.3*  HCT 31.6* 31.7* 30.9*  PLT 121* 148* 192  LABPROT 26.6* 29.3* 30.4*  INR 2.6* 2.9* 3.0*  CREATININE 2.53* 2.30* 2.13*    Estimated Creatinine Clearance: 15.8 mL/min (A) (by C-G formula based on SCr of 2.13 mg/dL (H)).   Medical History: Past Medical History:  Diagnosis Date  . Alzheimer's disease ([place])   . Atrial fibrillation ([place])   . Chronic kidney disease    Stage 4  . CVA (cerebrovascular accident) ([place])   . DVT (deep venous thrombosis) ([place]) 2013   LLE  . Hyperlipidemia   . Hypertension   . Pacemaker   . Pulmonary embolism [facility])     Assessment: 87 year old female presented with vomiting and diarrhea. Found to have left proximal ureteral calculus with obstruction, now s/p left ureteral stent placement. Blood cultures with 4/4 bottles E.coli, likely urinary source. Patient with h/o afib on warfarin PTA. Last known home dose warfarin 3 mg daily. INR therapeutic on admission.  Drug-drug interactions: ancef  Albumin: 3.3 >> 2.4 >> 2.2  Date INR Dose 1/4 2.1 3 mg (patient reported PTA) 1/5 -- Held 1/6 3.0 Held 1/7 2.6 3 mg 1/8 2.6 3 mg 1/9 2.9 2 mg 1/10     3.0  Goal of Therapy:  INR 2-3 Monitor platelets by anticoagulation protocol: Yes   Plan:  --INR is therapeutic but on the upper limit of normal with an upward trend. She has been on antibiotics since 1/5, this could be playing a role in the upward trend for her INR. Will hold warfarin dose today.  --Continue to monitor INR daily per protocol and CBC at least every 3 days.   Paulina Fusi, PharmD, BCPS 01/28/2020 9:24 AM

## 2020-01-28 NOTE — Care Management Important Message (Signed)
Important Message  Patient Details  Name: Hailey Lopez MRN: [id number] Date of Birth: October 09, 1929   Medicare Important Message Given:  Yes     Dannette Barbara 01/28/2020, 11:13 AM

## 2020-01-28 NOTE — Procedures (Addendum)
Patient Name: Hailey Lopez  MRN: [id number]  Epilepsy Attending: Lora Havens  Referring Physician/Provider: Dr Early Osmond Date: 01/28/2020  Duration: 22.49 mins  Patient history: 87yo F with ams and jerking movements. EEG to evaluate for seizure  Level of alertness: Awake  AEDs during EEG study: None  Technical aspects: This EEG study was done with scalp electrodes positioned according to the 10-20 International system of electrode placement. Electrical activity was acquired at a sampling rate of 500Hz  and reviewed with a high frequency filter of 70Hz  and a low frequency filter of 1Hz . EEG data were recorded continuously and digitally stored.   Description: No posterior dominant rhythm was seen.  EEG showed continuous generalized 3 to 6 Hz theta-delta slowing. Physiologic photic driving was not seen during photic stimulation. Hyperventilation was not performed.     Of note, parts of study were technically difficult due to electrode and movement artifact.  ABNORMALITY -Continuous slow, generalized  IMPRESSION: This technically difficult study is suggestive of moderate diffuse encephalopathy, nonspecific etiology. No seizures or epileptiform discharges were seen throughout the recording.   Hailey Lopez

## 2020-01-28 NOTE — Evaluation (Signed)
Physical Therapy Evaluation Patient Details Name: Hailey Lopez MRN: [id number] DOB: 06-19-1929 Today's Date: 01/28/2020   History of Present Illness  Per MD notes: Pt is an 87 year old female with past medical history of A. fib-on Coumadin, CKD stage IV, hypertension, hyperlipidemia, CVA, Alzheimer's dementia presents to emergency department with vomiting, generalized weakness, one episode of diarrhea and syncope.  MD assessment includes: Severe sepsis in the setting of obstructive uropathy/complicated Ecoli UTI, E. coli/Enterobacterales bacteremia, AKI on CKD stage IV, Elevated troponin likely demand ischemia, jerking movements of unkown etiology, HTN, hyponatremia, thrombocytopenia, Left proximal ureteral calculus with obstruction s/p cystoscopy and left ureteral stent placement, and debility.    Clinical Impression  Pt non-verbal during the session with very little ability to follow multi-modal cues.  Ultimately the pt required total assist with bed mobility tasks and once sitting at the EOB pushed hard to the right.  Extensive effort made to assist pt with achieving neutral sitting position and although there were some momentary improvements in her general sitting position she quickly would revert back to a hard R lateral lean.  Transfer attempt deferred for pt safety.  Overall pt presented with a significant decline in functional mobility compared to her baseline and would not be safe to return to her prior living situation at this time.  Pt will benefit from PT services in a SNF setting upon discharge to safely address deficits listed in patient problem list for decreased caregiver assistance and eventual return to PLOF.     Follow Up Recommendations SNF    Equipment Recommendations  Other (comment) (TBD at next venue of care)    Recommendations for Other Services       Precautions / Restrictions Precautions Precautions: Fall Restrictions Weight Bearing Restrictions: No       Mobility  Bed Mobility Overal bed mobility: Needs Assistance Bed Mobility: Supine to Sit;Sit to Supine     Supine to sit: +2 for physical assistance;Total assist Sit to supine: Total assist;+2 for physical assistance   General bed mobility comments: No assistance from the patient with bed mobility tasks    Transfers                 General transfer comment: Unable/unsafe to attempt secondary to pt unable to maintain static sitting at EOB with max A to prevent R lateral LOB  Ambulation/Gait                Stairs            Wheelchair Mobility    Modified Rankin (Stroke Patients Only)       Balance Overall balance assessment: Needs assistance   Sitting balance-Leahy Scale: Poor   Postural control: Right lateral lean                                   Pertinent Vitals/Pain Pain Assessment: Faces Faces Pain Scale: No hurt    Home Living Family/patient expects to be discharged to:: Private residence Living Arrangements: Other relatives Available Help at Discharge: Family;Available 24 hours/day Type of Home: Apartment Home Access: Elevator     Home Layout: One level Home Equipment: Bedside commode;Wheelchair - manual;Grab bars - tub/shower Additional Comments: tub transfer bench; lives with niece and niece's husband    Prior Function Level of Independence: Needs assistance   Gait / Transfers Assistance Needed: HHA with ambulation HH distances with w/c for community access, no fall history  ADL's /  Homemaking Assistance Needed: Assist from family with all ADLs        Hand Dominance        Extremity/Trunk Assessment   Upper Extremity Assessment Upper Extremity Assessment: Generalized weakness    Lower Extremity Assessment Lower Extremity Assessment: Generalized weakness       Communication   Communication: Expressive difficulties;Receptive difficulties  Cognition Arousal/Alertness: Awake/alert Behavior During  Therapy: Flat affect Overall Cognitive Status: Impaired/Different from baseline Area of Impairment: Following commands                       Following Commands: Follows one step commands inconsistently       General Comments: Pt with h/o dementia but minimally below baseline per niece including decreased ability to follow commands and decreased verbal communication      General Comments      Exercises Other Exercises Other Exercises: Static sitting at EOB with emphasis on attempting to decrease R lateral lean and achieve neutral sitting position Other Exercises: Rolling activities in bed to assist with hygiene   Assessment/Plan    PT Assessment Patient needs continued PT services  PT Problem List Decreased strength;Decreased activity tolerance;Decreased balance;Decreased mobility;Decreased cognition       PT Treatment Interventions DME instruction;Gait training;Stair training;Functional mobility training;Therapeutic activities;Therapeutic exercise;Balance training;Patient/family education    PT Goals (Current goals can be found in the Care Plan section)  Acute Rehab PT Goals Patient Stated Goal: To get stronger and walk better before returning home PT Goal Formulation: With family Time For Goal Achievement: 02/10/20 Potential to Achieve Goals: Fair    Frequency Min 2X/week   Barriers to discharge Inaccessible home environment      Co-evaluation               AM-PAC PT "6 Clicks" Mobility  Outcome Measure Help needed turning from your back to your side while in a flat bed without using bedrails?: Total Help needed moving from lying on your back to sitting on the side of a flat bed without using bedrails?: Total Help needed moving to and from a bed to a chair (including a wheelchair)?: Total Help needed standing up from a chair using your arms (e.g., wheelchair or bedside chair)?: Total Help needed to walk in hospital room?: Total Help needed climbing  3-5 steps with a railing? : Total 6 Click Score: 6    End of Session Equipment Utilized During Treatment: Gait belt Activity Tolerance: Patient tolerated treatment well Patient left: in bed;with nursing/sitter in room;with family/visitor present;with call bell/phone within reach;with bed alarm set Nurse Communication: Mobility status PT Visit Diagnosis: Difficulty in walking, not elsewhere classified (R26.2);Muscle weakness (generalized) (M62.81)    Time: 6433-2951 PT Time Calculation (min) (ACUTE ONLY): 33 min   Charges:   PT Evaluation $PT Eval Moderate Complexity: 1 Mod PT Treatments $Therapeutic Activity: 8-22 mins        D. Royetta Asal PT, DPT 01/28/20, 5:05 PM

## 2020-01-28 NOTE — Progress Notes (Signed)
CSW acknowledges active consult for Home Health/DME needs. Please place PT/OT evals when appropriate and CSW will follow up based on their recommendations.  Hailey Lopez, [place]

## 2020-01-28 NOTE — Plan of Care (Signed)

## 2020-01-28 NOTE — Progress Notes (Signed)
ID Patient nonverbal Awake Niece at bedside  feeding her Niece states that she has had involuntary jerking movements Had EEG no evidence of seizures.  Patient Vitals for the past 24 hrs:  BP Temp Temp src Pulse Resp SpO2  01/28/20 1219 118/70 98.1 F (36.7 C) - (!) 59 17 100 %  01/28/20 0749 (!) 89/77 97.8 F (36.6 C) - 64 16 99 %  01/28/20 0400 - 98.2 F (36.8 C) - 67 16 100 %  01/27/20 2246 (!) 168/70 - - - - -  01/27/20 2016 - 99.2 F (37.3 C) Oral 96 20 99 %  01/27/20 1558 (!) 171/83 98.6 F (37 C) Oral 85 18 100 %  On examination awake No distress Nonverbal Does not follow any commands when I ask her. Chest bilateral air entry Heart sounds S1-S2 CNS cannot be assessed    CMP Latest Ref Rng & Units 01/28/2020 01/27/2020 01/26/2020  Glucose 70 - 99 mg/dL 112(H) 120(H) 141(H)  BUN 8 - 23 mg/dL 33(H) 31(H) 35(H)  Creatinine 0.44 - 1.00 mg/dL 2.13(H) 2.30(H) 2.53(H)  Sodium 135 - 145 mmol/L 146(H) 148(H) 149(H)  Potassium 3.5 - 5.1 mmol/L 3.9 3.8 3.7  Chloride 98 - 111 mmol/L 113(H) 118(H) 118(H)  CO2 22 - 32 mmol/L 25 23 23   Calcium 8.9 - 10.3 mg/dL 9.0 9.1 9.2  Total Protein 6.5 - 8.1 g/dL 5.9(L) 5.6(L) 5.4(L)  Total Bilirubin 0.3 - 1.2 mg/dL 0.6 0.8 0.5  Alkaline Phos 38 - 126 U/L 50 50 53  AST 15 - 41 U/L 39 33 40  ALT 0 - 44 U/L 19 20 23     CBC Latest Ref Rng & Units 01/28/2020 01/27/2020 01/26/2020  WBC 4.0 - 10.5 K/uL 6.5 7.4 8.1  Hemoglobin 12.0 - 15.0 g/dL 10.3(L) 10.4(L) 10.4(L)  Hematocrit 36.0 - 46.0 % 30.9(L) 31.7(L) 31.6(L)  Platelets 150 - 400 K/uL 192 148(L) 121(L)     Impression/recommendation E. coli bacteremia with complicated UTI from renal stone blocking the left pelvic ureteric junction causing mild hydronephrosis.  Status post stent placement.  She is doing better.  Leukocytosis and fever  resolved. Currently on cefazolin.  After day 7 it can be switched to p.o. Keflex to complete total of 14 days of treatment.  Last date would be 02/06/2020.  AKI  on CKD much improved  Dementia Discussed the management with niece. ID will sign off now call if needed.

## 2020-01-28 NOTE — Progress Notes (Signed)
eeg done °

## 2020-01-29 ENCOUNTER — Ambulatory Visit: Payer: Medicare Other | Admitting: Podiatry

## 2020-01-29 DIAGNOSIS — N171 Acute kidney failure with acute cortical necrosis: Secondary | ICD-10-CM | POA: Diagnosis not present

## 2020-01-29 DIAGNOSIS — R6520 Severe sepsis without septic shock: Secondary | ICD-10-CM | POA: Diagnosis not present

## 2020-01-29 DIAGNOSIS — A4151 Sepsis due to Escherichia coli [E. coli]: Secondary | ICD-10-CM | POA: Diagnosis not present

## 2020-01-29 LAB — BASIC METABOLIC PANEL
Anion gap: 11 (ref 5–15)
BUN: 33 mg/dL — ABNORMAL HIGH (ref 8–23)
CO2: 23 mmol/L (ref 22–32)
Calcium: 9.1 mg/dL (ref 8.9–10.3)
Chloride: 113 mmol/L — ABNORMAL HIGH (ref 98–111)
Creatinine, Ser: 2.03 mg/dL — ABNORMAL HIGH (ref 0.44–1.00)
GFR, Estimated: 23 mL/min — ABNORMAL LOW (ref >60)
Glucose, Bld: 104 mg/dL — ABNORMAL HIGH (ref 70–99)
Potassium: 4 mmol/L (ref 3.5–5.1)
Sodium: 147 mmol/L — ABNORMAL HIGH (ref 135–145)

## 2020-01-29 LAB — TSH: TSH: 3.821 u[IU]/mL (ref 0.350–4.500)

## 2020-01-29 LAB — FOLATE: Folate: 8 ng/mL (ref >5.9)

## 2020-01-29 LAB — PROTIME-INR
INR: 2.3 — ABNORMAL HIGH (ref 0.8–1.2)
Prothrombin Time: 24.6 seconds — ABNORMAL HIGH (ref 11.4–15.2)

## 2020-01-29 LAB — AMMONIA: Ammonia: 18 umol/L (ref 9–35)

## 2020-01-29 LAB — VITAMIN B12: Vitamin B-12: 1071 pg/mL — ABNORMAL HIGH (ref 180–914)

## 2020-01-29 MED ORDER — DEXTROSE 5 % IV SOLN: INTRAVENOUS | Status: DC

## 2020-01-29 MED ORDER — WARFARIN SODIUM 2 MG PO TABS
2.0000 mg | ORAL_TABLET | Freq: Once | ORAL | Status: AC
  Administered 2020-01-29: 2 mg via ORAL
  Filled 2020-01-29: qty 1

## 2020-01-29 NOTE — NC FL2 (Signed)
[place] LEVEL OF CARE SCREENING TOOL     IDENTIFICATION  Patient Name: Hailey Lopez Birthdate: 06-15-1929 Sex: female Admission Date (Current Location): 01/22/2020  [facility] and [place] Number:  Engineering geologist and Address:  [facility], [address] [place], [place] [address]      Provider Number: [id number]  Attending Physician Name and Address:  Mckinley Jewel, MD  Relative Name and Phone Number:       Current Level of Care: Hospital Recommended Level of Care: [place] Prior Approval Number:    Date Approved/Denied:   PASRR Number: [id number]  Discharge Plan: SNF    Current Diagnoses: Patient Active Problem List   Diagnosis Date Noted   Sepsis ([place]) 56/21/3086   Acute metabolic encephalopathy 57/84/6962   Lactic acidosis 12/24/2019   Acute renal failure superimposed on stage 4 chronic kidney disease ([place]) 12/24/2019   UTI (urinary tract infection) 12/23/2019   Long term (current) use of anticoagulants 07/31/2018   SSS (sick sinus syndrome) ([place]) 11/25/2017   Paroxysmal atrial fibrillation ([place]) 11/25/2017   Pacemaker 11/25/2017   History of pulmonary embolism 11/25/2017   CKD (chronic kidney disease) stage 4, GFR 15-29 ml/min ([place]) 11/25/2017   Essential hypertension 11/25/2017   Hypercholesterolemia 11/25/2017    Orientation RESPIRATION BLADDER Height & Weight     Self  Normal Incontinent,External catheter Weight: 138 lb 14.2 oz (63 kg) Height:  5\' 4"  (162.6 cm)  BEHAVIORAL SYMPTOMS/MOOD NEUROLOGICAL BOWEL NUTRITION STATUS      Incontinent  (dys 1 thin liquids)  AMBULATORY STATUS COMMUNICATION OF NEEDS Skin   Extensive Assist Verbally  (incision- perineum)                       Personal Care Assistance Level of Assistance  Bathing,Feeding,Dressing Bathing Assistance: Maximum assistance Feeding assistance: Limited assistance Dressing Assistance: Maximum assistance      Functional Limitations Info             SPECIAL CARE FACTORS FREQUENCY  PT (By licensed PT),OT (By licensed OT)     PT Frequency: 5 x/week OT Frequency: 5 x/week            Contractures      Additional Factors Info  Code Status,Allergies Code Status Info: DNR Allergies Info: nka           Current Medications (01/29/2020):  This is the current hospital active medication list Current Facility-Administered Medications  Medication Dose Route Frequency Provider Last Rate Last Admin   0.9 %  sodium chloride infusion   Intravenous PRN Flora Lipps, MD 10 mL/hr at 01/23/20 1338 250 mL at 01/23/20 1338   acetaminophen (TYLENOL) tablet 650 mg  650 mg Oral Q4H PRN Bradly Bienenstock, NP   650 mg at 01/25/20 1845   atorvastatin (LIPITOR) tablet 10 mg  10 mg Oral Daily Pahwani, Rinka R, MD   10 mg at 01/28/20 9528   calcitRIOL (ROCALTROL) capsule 0.5 mcg  0.5 mcg Oral QHS Pahwani, Rinka R, MD   0.5 mcg at 01/28/20 2200   ceFAZolin (ANCEF) IVPB 1 g/50 mL premix  1 g Intravenous Q12H Oswald Hillock, RPH 100 mL/hr at 01/29/20 1027 1 g at 01/29/20 1027   [START ON 01/30/2020] cephALEXin (KEFLEX) capsule 500 mg  500 mg Oral Q8H Patel, Kishan S, RPH       Chlorhexidine Gluconate Cloth 2 % PADS 6 each  6 each Topical Q0600 Bradly Bienenstock, NP  6 each at 01/27/20 0618   dextrose 5 % solution   Intravenous Continuous Pahwani, Rinka R, MD       docusate sodium (COLACE) capsule 100 mg  100 mg Oral BID PRN Bradly Bienenstock, NP       donepezil (ARICEPT) tablet 5 mg  5 mg Oral QHS Pahwani, Rinka R, MD   5 mg at 01/28/20 2200   feeding supplement (ENSURE ENLIVE / ENSURE PLUS) liquid 237 mL  237 mL Oral TID BM Pahwani, Rinka R, MD   237 mL at 01/29/20 1029   hydrALAZINE (APRESOLINE) injection 10 mg  10 mg Intravenous Q6H PRN Pahwani, Rinka R, MD       isosorbide dinitrate (ISORDIL) tablet 5 mg  5 mg Oral Daily Pahwani, Rinka R, MD   5 mg at 01/29/20 1028   LORazepam (ATIVAN)  injection 0.5 mg  0.5 mg Intravenous Q6H PRN Pahwani, Rinka R, MD   0.5 mg at 01/27/20 0240   multivitamin with minerals tablet 1 tablet  1 tablet Oral Daily Pahwani, Rinka R, MD   1 tablet at 01/29/20 1027   ondansetron (ZOFRAN) injection 4 mg  4 mg Intravenous Q6H PRN Darel Hong D, NP       oxyCODONE (Oxy IR/ROXICODONE) immediate release tablet 5 mg  5 mg Oral Q4H PRN Pahwani, Rinka R, MD   5 mg at 01/26/20 1827   polyethylene glycol (MIRALAX / GLYCOLAX) packet 17 g  17 g Oral Daily PRN Darel Hong D, NP       traZODone (DESYREL) tablet 50 mg  50 mg Oral QHS PRN Pahwani, Rinka R, MD       warfarin (COUMADIN) tablet 2 mg  2 mg Oral ONCE-1600 Pahwani, Rinka R, MD       Warfarin - Pharmacist Dosing Inpatient   Does not apply [id number] Flora Lipps, MD   Given at 01/28/20 1645     Discharge Medications: Please see discharge summary for a list of discharge medications.  Relevant Imaging Results:  Relevant Lab Results:   Additional Information SS #: [id number]  [place], LCSW

## 2020-01-29 NOTE — Progress Notes (Addendum)
PROGRESS NOTE    Hailey Lopez  FYB:[id number] DOB: 07/12/1929 DOA: 01/22/2020 PCP: Marda Stalker, PA-C   Brief Narrative:  Patient is 87 year old female with past medical history of A. fib-on Coumadin, CKD stage IV, hypertension, hyperlipidemia, CVA, Alzheimer's dementia presents to emergency department with vomiting, generalized weakness, one episode of diarrhea and syncope.  ED course: Patient afebrile, v BP: 145/108, pulse: 136, EKG shows sinus tachycardia and nonspecific ST-T wave changes.  Sodium 146, bicarb 20, anion gap 16, BUN 44, creatinine: 3.45, lactic acid 8.1, procalcitonin 80.51, WBC 11.7, INR: 2.1.  UA is consistent with UTI.  Chest x-ray negative for acute findings.  CT head is negative.  CT abdomen and pelvis showed mild left hydronephrosis with perinephric stranding due to obstructing 15 mm calculus in the left Vitro pelvic junction.  She met sepsis criteria therefore she was given 2 L of IV fluid bolus and started on cefepime.  Urology was consulted.  She tested negative for COVID-19.  Patient admitted under PCCM.  [facility] took over care from 01/23/2018  Assessment & Plan:   Severe sepsis in the setting of obstructive uropathy/complicated Ecoli & Proteus UTI, E. coli/Enterobacterales bacteremia: -Status post left ureteral stent placement by urology.  POD 4. -Urology consulted- discontinue Foley catheter -Follow-up with urology outpatient to discuss definitive stone management. -Patient is afebrile.  Leukocytosis: Resolved.  Procalcitonin level: 80.51-136-109-70.41-33.37. -Continue cefazolin.  She will need total of 14 days of antibiotics ( until 01/29/2020)-switch to p.o. Keflex 500 mg every 8 hours to complete 14 days course on 01/30/19.  Last day of therapy would be on 02/06/2020.  Appreciate IDs recommendation.  AKI on CKD stage IV: -Baseline creatinine: 1.95.  On admission: Creatinine: 3.87, GFR: 11. -In the setting of obstructive uropathy.  Renal function  slowly improving. -Nephrology consulted-no indication for dialysis.  Not a candidate for long-term dialysis at this time.    Acute metabolic encephalopathy: -In the setting of sepsis.  No previous history of dementia as per family. -CT head shows no acute changes.  Advanced senescent  changes and borderline ventricular enlargement. -Will check B12, folate, TSH and ammonia level. -Consult PT/OT-recommend SNF-TOC consulted.  Elevated troponin: -Troponin 214 trended up to 764-819.  EKG: No acute ST-T wave changes noted.  Likely in the setting of demand ischemia in the setting of sepsis -Continue to monitor.  Patient denies any ACS symptoms.  Jerking movements: -Unknown etiology?.  Improving as per RN & the family.  Reviewed meds with pharmacy.  Vitals remained stable.  Could be secondary to pain/discomfort due to purewick.  Continue oxycodone and Ativan as needed. -EEG: No seizure, diffuse encephalopathy  History of PE and DVT: Continue Coumadin as per pharmacy.  Monitor PT/INR  Hyperlipidemia: Continue atorvastatin  Alzheimer's dementia: Continue donepezil  Hypertension: Blood pressure stable this morning.  Continue Isordil -Hydralazine as needed for blood pressure more than 160/100. -Monitor blood pressure closely  Hypernatremia: Dehydration?.  Started on D5.  Repeat BMP tomorrow AM.  Thrombocytopenia: Resolved.  No signs of active bleeding.   Debility: PT/OT recommended SNF  DVT prophylaxis: Coumadin Code Status: DNR Family Communication: None present at bedside.  Plan of care discussed with patient in length and she verbalized understanding and agreed with it.  I called patient's niece and discussed plan of care and she verbalized understanding.  Disposition Plan: To be determined  Consultants:   PCCM  ID  Nephrology  urology  Procedures:   Left ureteral stent placement  Antimicrobials:   Rocephin  Cefazolin  Status is: Inpatient   Dispo: The patient  is from: Home              Anticipated d/c is to: SNF              Anticipated d/c date is: 3 days              Patient currently is not medically stable to d/c.    Subjective: Patient seen and examined.  No family at the bedside.  Alert and awake but not following commands and not talking.  No acute events overnight.  No further jerking movements.  Remained afebrile.  Objective: Vitals:   01/28/20 2327 01/29/20 0144 01/29/20 0528 01/29/20 0824  BP: 127/78  (!) 129/95 128/82  Pulse: 99  78 71  Resp: _0 Temp: 98.1 F (36.7 C)  (!) 97 F (36.1 C) (!) 97.4 F (36.3 C)  TempSrc:      SpO2: 99%   95%  Weight:  63 kg    Height:        Intake/Output Summary (Last 24 hours) at 01/29/2020 0936 Last data filed at 01/28/2020 1056 Gross per 24 hour  Intake 240 ml  Output --  Net 240 ml   Filed Weights   01/27/20 0035 01/27/20 0613 01/29/20 0144  Weight: 63.8 kg 63.2 kg 63 kg    Examination:  General exam: Appears calm and comfortable, elderly, on room air, alert but not following commands.   Respiratory system: Clear to auscultation. Respiratory effort normal. Cardiovascular system: S1 & S2 heard, RRR. No JVD, murmurs, rubs, gallops or clicks. No pedal edema. Gastrointestinal system: Abdomen is nondistended, soft and nontender. No organomegaly or masses felt. Normal bowel sounds heard.  Has Foley. Central nervous system: Alert, awake, not following commands Skin: No rashes, lesions or ulcers   Data Reviewed: I have personally reviewed following labs and imaging studies  CBC: Recent Labs  Lab 01/24/20 0643 01/25/20 0219 01/26/20 0446 01/27/20 0428 01/28/20 0238  WBC 15.2* 11.7* 8.1 7.4 6.5  NEUTROABS 13.0* 9.5* 5.7  --   --   HGB 11.7* 10.6* 10.4* 10.4* 10.3*  HCT 34.3* 32.0* 31.6* 31.7* 30.9*  MCV 91.2 92.5 92.9 92.2 91.2  PLT 117* 120* 121* 148* 121   Basic Metabolic Panel: Recent Labs  Lab 01/25/20 0219 01/26/20 0446 01/27/20 0428 01/28/20 0238  01/29/20 0454  NA 145 149* 148* 146* 147*  K 3.5 3.7 3.8 3.9 4.0  CL 113* 118* 118* 113* 113*  CO2 _1 GLUCOSE 171* 141* 120* 112* 104*  BUN 42* 35* 31* 33* 33*  CREATININE 3.22* 2.53* 2.30* 2.13* 2.03*  CALCIUM 9.2 9.2 9.1 9.0 9.1  MG  --   --  1.8  --   --    GFR: Estimated Creatinine Clearance: 16.5 mL/min (A) (by C-G formula based on SCr of 2.03 mg/dL (H)). Liver Function Tests: Recent Labs  Lab 01/24/20 0643 01/25/20 0219 01/26/20 0446 01/27/20 0428 01/28/20 0238  AST 55* 44* 40 33 39  ALT _2 ALKPHOS 57 61 53 50 50  BILITOT 0.7 0.6 0.5 0.8 0.6  PROT 5.8* 5.7* 5.4* 5.6* 5.9*  ALBUMIN 2.4* 2.2* 2.2* 2.2* 2.4*   Recent Labs  Lab 01/22/20 2330  LIPASE 69*   No results for input(s): AMMONIA in the last 168 hours. Coagulation Profile: Recent Labs  Lab 01/25/20 0219 01/26/20 0446 01/27/20 0428 01/28/20 0238 01/29/20 0454  INR  2.6* 2.6* 2.9* 3.0* 2.3*   Cardiac Enzymes: No results for input(s): CKTOTAL, CKMB, CKMBINDEX, TROPONINI in the last 168 hours. BNP (last 3 results) No results for input(s): PROBNP in the last 8760 hours. HbA1C: No results for input(s): HGBA1C in the last 72 hours. CBG: Recent Labs  Lab 01/24/20 1115 01/24/20 1640 01/24/20 2002 01/25/20 0813 01/25/20 1205  GLUCAP 110* 104* 68* 94 107*   Lipid Profile: No results for input(s): CHOL, HDL, LDLCALC, TRIG, CHOLHDL, LDLDIRECT in the last 72 hours. Thyroid Function Tests: No results for input(s): TSH, T4TOTAL, FREET4, T3FREE, THYROIDAB in the last 72 hours. Anemia Panel: No results for input(s): VITAMINB12, FOLATE, FERRITIN, TIBC, IRON, RETICCTPCT in the last 72 hours. Sepsis Labs: Recent Labs  Lab 01/24/20 3953 01/24/20 1546 01/24/20 1547 01/24/20 1858 01/25/20 0219 01/25/20 0757 01/25/20 1137 01/26/20 0446  PROCALCITON 136.62  --  109.53  --  70.41  --   --  33.37  LATICACIDVEN  --  2.7*  --  3.1*  --  1.3 1.7  --     Recent Results (from the  past 240 hour(s))  Urine culture     Status: Abnormal   Collection Time: 01/23/20 12:13 AM   Specimen: Urine, Random  Result Value Ref Range Status   Specimen Description   Final    URINE, RANDOM Performed at [facility], [address] [place], [place] [address]    Special Requests   Final    NONE Performed at [facility], [address] [place], [place] [address]    Culture >=100,000 COLONIES/mL ESCHERICHIA COLI (A)  Final   Report Status 01/25/2020 FINAL  Final   Organism ID, Bacteria ESCHERICHIA COLI (A)  Final      Susceptibility   Escherichia coli - MIC*    AMPICILLIN >=32 RESISTANT Resistant     CEFAZOLIN <=4 SENSITIVE Sensitive     CEFEPIME <=0.12 SENSITIVE Sensitive     CEFTRIAXONE <=0.25 SENSITIVE Sensitive     CIPROFLOXACIN <=0.25 SENSITIVE Sensitive     GENTAMICIN <=1 SENSITIVE Sensitive     IMIPENEM <=0.25 SENSITIVE Sensitive     NITROFURANTOIN <=16 SENSITIVE Sensitive     TRIMETH/SULFA >=320 RESISTANT Resistant     AMPICILLIN/SULBACTAM 4 SENSITIVE Sensitive     PIP/TAZO <=4 SENSITIVE Sensitive     * >=100,000 COLONIES/mL ESCHERICHIA COLI  Blood Culture (routine x 2)     Status: Abnormal   Collection Time: 01/23/20 12:13 AM   Specimen: BLOOD  Result Value Ref Range Status   Specimen Description   Final    BLOOD LEFT ANTECUBITAL Performed at [facility], [address] [place], [place] [address]    Special Requests   Final    BOTTLES DRAWN AEROBIC AND ANAEROBIC Blood Culture results may not be optimal due to an inadequate volume of blood received in culture bottles Performed at [facility], [address] [place], [place] [address]    Culture  Setup Time   Final    GRAM NEGATIVE RODS IN BOTH AEROBIC AND ANAEROBIC BOTTLES CRITICAL VALUE NOTED.  VALUE IS CONSISTENT WITH PREVIOUSLY REPORTED AND CALLED VALUE. Performed at [facility], [address] [place], [place] [address]    Culture (A)  Final     ESCHERICHIA COLI SUSCEPTIBILITIES PERFORMED ON PREVIOUS CULTURE WITHIN THE LAST 5 DAYS. Performed at [facility], [place] [address], [place], [place] [address]    Report Status 01/25/2020 FINAL  Final  Blood Culture (routine x  2)     Status: Abnormal   Collection Time: 01/23/20 12:13 AM   Specimen: BLOOD  Result Value Ref Range Status   Specimen Description   Final    BLOOD BLOOD RIGHT FOREARM Performed at [facility], [address], [place], [place] [address]    Special Requests   Final    BOTTLES DRAWN AEROBIC AND ANAEROBIC Blood Culture results may not be optimal due to an inadequate volume of blood received in culture bottles Performed at [facility], [address], [place], [place] [address]    Culture  Setup Time   Final    GRAM NEGATIVE RODS IN BOTH AEROBIC AND ANAEROBIC BOTTLES CRITICAL RESULT CALLED TO, READ BACK BY AND VERIFIED WITH: M.SLAUGHTER,PHARMD AT 1012 ON 01/23/20 BY GM Performed at [facility], [place] [address], [place], [place] [address]    Culture ESCHERICHIA COLI (A)  Final   Report Status 01/25/2020 FINAL  Final   Organism ID, Bacteria ESCHERICHIA COLI  Final      Susceptibility   Escherichia coli - MIC*    AMPICILLIN >=32 RESISTANT Resistant     CEFAZOLIN <=4 SENSITIVE Sensitive     CEFEPIME <=0.12 SENSITIVE Sensitive     CEFTAZIDIME <=1 SENSITIVE Sensitive     CEFTRIAXONE <=0.25 SENSITIVE Sensitive     CIPROFLOXACIN <=0.25 SENSITIVE Sensitive     GENTAMICIN <=1 SENSITIVE Sensitive     IMIPENEM <=0.25 SENSITIVE Sensitive     TRIMETH/SULFA >=320 RESISTANT Resistant     AMPICILLIN/SULBACTAM 4 SENSITIVE Sensitive     PIP/TAZO <=4 SENSITIVE Sensitive     * ESCHERICHIA COLI  SARS CORONAVIRUS 2 (TAT 6-24 HRS) Nasopharyngeal Nasopharyngeal Swab     Status: None   Collection Time: 01/23/20 12:13 AM   Specimen: Nasopharyngeal Swab  Result Value Ref Range Status   SARS Coronavirus 2 NEGATIVE NEGATIVE Final    Comment:  (NOTE) SARS-CoV-2 target nucleic acids are NOT DETECTED.  The SARS-CoV-2 RNA is generally detectable in upper and lower respiratory specimens during the acute phase of infection. Negative results do not preclude SARS-CoV-2 infection, do not rule out co-infections with other pathogens, and should not be used as the sole basis for treatment or other patient management decisions. Negative results must be combined with clinical observations, patient history, and epidemiological information. The expected result is Negative.  Fact Sheet for Patients: SugarRoll.be  Fact Sheet for Healthcare Providers: https://www.woods-mathews.com/  This test is not yet approved or cleared by the [place] FDA and  has been authorized for detection and/or diagnosis of SARS-CoV-2 by FDA under an Emergency Use Authorization (EUA). This EUA will remain  in effect (meaning this test can be used) for the duration of the COVID-19 declaration under Se ction 564(b)(1) of the Act, 21 U.S.C. section 360bbb-3(b)(1), unless the authorization is terminated or revoked sooner.  Performed at [facility], [place] [address], [place], [place] [address]   Blood Culture ID Panel (Reflexed)     Status: Abnormal   Collection Time: 01/23/20 12:13 AM  Result Value Ref Range Status   Enterococcus faecalis NOT DETECTED NOT DETECTED Final   Enterococcus Faecium NOT DETECTED NOT DETECTED Final   Listeria monocytogenes NOT DETECTED NOT DETECTED Final   Staphylococcus species NOT DETECTED NOT DETECTED Final   Staphylococcus aureus (BCID) NOT DETECTED NOT DETECTED Final   Staphylococcus epidermidis NOT DETECTED NOT DETECTED Final   Staphylococcus lugdunensis NOT DETECTED NOT DETECTED Final   Streptococcus species NOT DETECTED NOT DETECTED  Final   Streptococcus agalactiae NOT DETECTED NOT DETECTED Final   Streptococcus pneumoniae NOT DETECTED NOT DETECTED Final   Streptococcus  pyogenes NOT DETECTED NOT DETECTED Final   A.calcoaceticus-baumannii NOT DETECTED NOT DETECTED Final   Bacteroides fragilis NOT DETECTED NOT DETECTED Final   Enterobacterales DETECTED (A) NOT DETECTED Final    Comment: Enterobacterales represent a large order of gram negative bacteria, not a single organism. CRITICAL RESULT CALLED TO, READ BACK BY AND VERIFIED WITH: M.SLAUGHTER,PHARMD AT 1012 ON 01/23/20 BY GM    Enterobacter cloacae complex NOT DETECTED NOT DETECTED Final   Escherichia coli DETECTED (A) NOT DETECTED Final    Comment: CRITICAL RESULT CALLED TO, READ BACK BY AND VERIFIED WITH: M.SLAUGHTER,PHARMD AT 1012 ON 01/23/20 BY GM    Klebsiella aerogenes NOT DETECTED NOT DETECTED Final   Klebsiella oxytoca NOT DETECTED NOT DETECTED Final   Klebsiella pneumoniae NOT DETECTED NOT DETECTED Final   Proteus species NOT DETECTED NOT DETECTED Final   Salmonella species NOT DETECTED NOT DETECTED Final   Serratia marcescens NOT DETECTED NOT DETECTED Final   Haemophilus influenzae NOT DETECTED NOT DETECTED Final   Neisseria meningitidis NOT DETECTED NOT DETECTED Final   Pseudomonas aeruginosa NOT DETECTED NOT DETECTED Final   Stenotrophomonas maltophilia NOT DETECTED NOT DETECTED Final   Candida albicans NOT DETECTED NOT DETECTED Final   Candida auris NOT DETECTED NOT DETECTED Final   Candida glabrata NOT DETECTED NOT DETECTED Final   Candida krusei NOT DETECTED NOT DETECTED Final   Candida parapsilosis NOT DETECTED NOT DETECTED Final   Candida tropicalis NOT DETECTED NOT DETECTED Final   Cryptococcus neoformans/gattii NOT DETECTED NOT DETECTED Final   CTX-M ESBL NOT DETECTED NOT DETECTED Final   Carbapenem resistance IMP NOT DETECTED NOT DETECTED Final   Carbapenem resistance KPC NOT DETECTED NOT DETECTED Final   Carbapenem resistance NDM NOT DETECTED NOT DETECTED Final   Carbapenem resist OXA 48 LIKE NOT DETECTED NOT DETECTED Final   Carbapenem resistance VIM NOT DETECTED NOT DETECTED  Final    Comment: Performed at [facility], [address] [place], [place] [address]  Urine Culture     Status: Abnormal   Collection Time: 01/23/20  4:04 AM   Specimen: Urine, Random  Result Value Ref Range Status   Specimen Description   Final    URINE, RANDOM Performed at [facility], [address] [place], [place] [address]    Special Requests   Final    NONE Performed at [facility], [address] [place], [place] [address]    Culture (A)  Final    50,000 COLONIES/mL PROTEUS MIRABILIS >=100,000 COLONIES/mL ESCHERICHIA COLI    Report Status 01/27/2020 FINAL  Final   Organism ID, Bacteria PROTEUS MIRABILIS (A)  Final   Organism ID, Bacteria ESCHERICHIA COLI (A)  Final      Susceptibility   Escherichia coli - MIC*    AMPICILLIN >=32 RESISTANT Resistant     CEFAZOLIN <=4 SENSITIVE Sensitive     CEFEPIME <=0.12 SENSITIVE Sensitive     CEFTRIAXONE <=0.25 SENSITIVE Sensitive     CIPROFLOXACIN <=0.25 SENSITIVE Sensitive     GENTAMICIN <=1 SENSITIVE Sensitive     IMIPENEM <=0.25 SENSITIVE Sensitive     NITROFURANTOIN <=16 SENSITIVE Sensitive     TRIMETH/SULFA >=320 RESISTANT Resistant     AMPICILLIN/SULBACTAM 4 SENSITIVE Sensitive     PIP/TAZO <=4 SENSITIVE Sensitive     * >=100,000 COLONIES/mL ESCHERICHIA COLI   Proteus mirabilis - MIC*  AMPICILLIN <=2 SENSITIVE Sensitive     CEFAZOLIN <=4 SENSITIVE Sensitive     CEFEPIME <=0.12 SENSITIVE Sensitive     CEFTRIAXONE <=0.25 SENSITIVE Sensitive     CIPROFLOXACIN <=0.25 SENSITIVE Sensitive     GENTAMICIN <=1 SENSITIVE Sensitive     IMIPENEM 8 INTERMEDIATE Intermediate     NITROFURANTOIN RESISTANT Resistant     TRIMETH/SULFA <=20 SENSITIVE Sensitive     AMPICILLIN/SULBACTAM <=2 SENSITIVE Sensitive     PIP/TAZO <=4 SENSITIVE Sensitive     * 50,000 COLONIES/mL PROTEUS MIRABILIS  MRSA PCR Screening     Status: None   Collection Time: 01/23/20  5:23 AM   Specimen: Nasopharyngeal  Result  Value Ref Range Status   MRSA by PCR NEGATIVE NEGATIVE Final    Comment:        The GeneXpert MRSA Assay (FDA approved for NASAL specimens only), is one component of a comprehensive MRSA colonization surveillance program. It is not intended to diagnose MRSA infection nor to guide or monitor treatment for MRSA infections. Performed at [facility], [address] [place], [place] [address]       Radiology Studies: EEG  Result Date: 01/28/2020 Lora Havens, MD     01/28/2020  2:32 PM Patient Name: Hailey Lopez MRN: [id number] Epilepsy Attending: Lora Havens Referring Physician/Provider: Dr Early Osmond Date: 01/28/2020 Duration: 22.49 mins Patient history: 87yo F with ams and jerking movements. EEG to evaluate for seizure Level of alertness: Awake AEDs during EEG study: None Technical aspects: This EEG study was done with scalp electrodes positioned according to the 10-20 International system of electrode placement. Electrical activity was acquired at a sampling rate of _0  and reviewed with a high frequency filter of _1  and a low frequency filter of _2 . EEG data were recorded continuously and digitally stored. Description: No posterior dominant rhythm was seen. EEG showed continuous generalized 3 to 6 Hz theta-delta slowing. Physiologic photic driving was not seen during photic stimulation. Hyperventilation was not performed.   Of note, parts of study were technically difficult due to electrode and movement artifact. ABNORMALITY -Continuous slow, generalized IMPRESSION: This technically difficult study is suggestive of moderate diffuse encephalopathy, nonspecific etiology. No seizures or epileptiform discharges were seen throughout the recording. Priyanka Barbra Sarks    Scheduled Meds: . atorvastatin  10 mg Oral Daily  . calcitRIOL  0.5 mcg Oral QHS  . [START ON 01/30/2020] cephALEXin  500 mg Oral Q8H  . Chlorhexidine Gluconate Cloth  6 each Topical Q0600  . donepezil   5 mg Oral QHS  . feeding supplement  237 mL Oral TID BM  . isosorbide dinitrate  5 mg Oral Daily  . multivitamin with minerals  1 tablet Oral Daily  . warfarin  2 mg Oral ONCE-1600  . Warfarin - Pharmacist Dosing Inpatient   Does not apply q1600   Continuous Infusions: . sodium chloride 250 mL (01/23/20 1338)  .  ceFAZolin (ANCEF) IV 1 g (01/29/20 0014)  . dextrose       LOS: 6 days   Time spent: 35 minutes  Anoop Hemmer Loann Quill, MD [facility]  If 7PM-7AM, please contact night-coverage [email or web address] 01/29/2020, 9:36 AM

## 2020-01-29 NOTE — Progress Notes (Addendum)
Calorie Count Note  48 hour calorie count ordered. Day 1 results:  Diet: dysphagia 1 with thin Supplements: Ensure Enlive po TID, Magic Cup po BID  Dinner 1/10: 770 kcal, 25 grams of protein (30% puree chicken with gravy, 25% mashed potatoes, 25% puree green beans, 100% puree berries, 50% tea, 2 servings Magic Cup) Breakfast 1/11: 398 kcal, 14 grams of protein (80% puree scrambled eggs, 100% puree waffle with syrup, 50% orange juice) Lunch 1/11: 353 kcal, 11 grams of protein (10% puree beef with gravy, 10% mashed potatoes, 10% puree corn, 25% puree peaches, 1 Magic Cup) Supplements: N/A - only sips of Ensure  Total intake Day 1: 1521 kcal (95% of minimum estimated kcal needs)  50 grams of protein (63% of minimum estimated protein needs)  Estimated Nutritional Needs:  Kcal:  1600-1800 Protein:  80-90 grams Fluid:  1.6 L/day  Nutrition Dx: Inadequate oral intake related to decreased appetite as evidenced by per patient/family report,meal completion < 50%.  Goal: Patient will meet greater than or equal to 90% of their needs  Intervention: -Continue calorie count. -Continue Ensure Enlive po TID, each supplement provides 350 kcal and 20 grams of protein. -Continue Magic cup BID with lunch and dinner, each supplement provides 290 kcal and 9 grams of protein. -Continue MVI po daily.  Jacklynn Barnacle, MS, RD, LDN Pager number available on Amion

## 2020-01-29 NOTE — Progress Notes (Signed)
ANTICOAGULATION CONSULT NOTE  Pharmacy Consult for warfarin Indication: atrial fibrillation   Labs: Recent Labs    01/27/20 0428 01/28/20 0238 01/29/20 0454  HGB 10.4* 10.3*  --   HCT 31.7* 30.9*  --   PLT 148* 192  --   LABPROT 29.3* 30.4* 24.6*  INR 2.9* 3.0* 2.3*  CREATININE 2.30* 2.13* 2.03*    Estimated Creatinine Clearance: 16.5 mL/min (A) (by C-G formula based on SCr of 2.03 mg/dL (H)).   Medical History: Past Medical History:  Diagnosis Date  . Alzheimer's disease ([place])   . Atrial fibrillation ([place])   . Chronic kidney disease    Stage 4  . CVA (cerebrovascular accident) ([place])   . DVT (deep venous thrombosis) ([place]) 2013   LLE  . Hyperlipidemia   . Hypertension   . Pacemaker   . Pulmonary embolism [facility])     Assessment: 87 year old female presented with vomiting and diarrhea. Found to have left proximal ureteral calculus with obstruction, now s/p left ureteral stent placement. Blood cultures with 4/4 bottles E.coli, likely urinary source. Patient with h/o afib on warfarin PTA. Last known home dose warfarin 3 mg daily. INR therapeutic on admission.  Drug-drug interactions: ancef, cephalexin  Albumin: 3.3 >> 2.4 >> 2.2 >> 2.4  Date INR Dose 1/4 2.1 3 mg (patient reported PTA) 1/5 -- Held 1/6 3.0 Held 1/7 2.6 3 mg 1/8 2.6 3 mg 1/9 2.9 2 mg 1/10     3.0 DOSE HELD 1/11 2.3  Goal of Therapy:  INR 2-3 Monitor platelets by anticoagulation protocol: Yes   Plan:  --INR is therapeutic. Will order warfarin 2 mg x 1 dose today. Hgb 10.3  Plt 192  --Continue to monitor INR daily per protocol and CBC at least every 3 days.   Chinita Greenland PharmD Clinical Pharmacist 01/29/2020

## 2020-01-29 NOTE — TOC Initial Note (Signed)
Transition of Care [facility]) - Initial/Assessment Note    Patient Details  Name: Hailey Lopez MRN: [id number] Date of Birth: [birth year]  Transition of Care [facility]) CM/SW Contact:    Magnus Ivan, LCSW Phone Number: 01/29/2020, 3:49 PM  Clinical Narrative:            CSW spoke with patient's niece/caregiver [name]. Patient lives with [name] who provides transportation. PCP is [facility]. Pharmacy is [place]. No SNF history. Patient has a w/c. Patient had [place] prior to this hospitalization. [name] agreeable to SNF. SNF work up started. Will need to start insurance auth when patient is close to DC.       Expected Discharge Plan: Skilled Nursing Facility Barriers to Discharge: Continued Medical Work up   Patient Goals and CMS Choice Patient states their goals for this hospitalization and ongoing recovery are:: SNF rehab CMS Medicare.gov Compare Post Acute Care list provided to:: Patient Represenative (must comment) Choice offered to / list presented to : [place] / Guardian  Expected Discharge Plan and Services Expected Discharge Plan: [place]       Living arrangements for the past 2 months: Single Family Home                                      Prior Living Arrangements/Services Living arrangements for the past 2 months: Single Family Home Lives with:: Relatives (niece/caregiver) Patient language and need for interpreter reviewed:: Yes Do you feel safe going back to the place where you live?: Yes      Need for Family Participation in Patient Care: Yes (Comment) Care giver support system in place?: Yes (comment) Current home services: DME Criminal Activity/Legal Involvement Pertinent to Current Situation/Hospitalization: No - Comment as needed  Activities of Daily Living Home Assistive Devices/Equipment: Wheelchair,Raised toilet seat with rails,Tub transfer bench,Bedside  commode/3-in-1,Shower chair with back ADL Screening (condition at time of admission) Patient's cognitive ability adequate to safely complete daily activities?: No Is the patient deaf or have difficulty hearing?: No Does the patient have difficulty seeing, even when wearing glasses/contacts?: No Does the patient have difficulty concentrating, remembering, or making decisions?: Yes Patient able to express need for assistance with ADLs?: Yes Does the patient have difficulty dressing or bathing?: Yes Independently performs ADLs?: No Communication: Dependent Is this a change from baseline?: Pre-admission baseline Dressing (OT): Dependent Is this a change from baseline?: Pre-admission baseline Grooming: Dependent Is this a change from baseline?: Pre-admission baseline Feeding: Dependent Is this a change from baseline?: Pre-admission baseline Bathing: Dependent Is this a change from baseline?: Pre-admission baseline Toileting: Dependent Is this a change from baseline?: Pre-admission baseline In/Out Bed: Dependent,Independent with device (comment) Is this a change from baseline?: Pre-admission baseline Walks in Home: Independent with device (comment),Dependent Is this a change from baseline?: Pre-admission baseline Does the patient have difficulty walking or climbing stairs?: Yes Weakness of Legs: Both Weakness of Arms/Hands: Both  Permission Sought/Granted Permission sought to share information with : Facility Art therapist granted to share information with : Yes, Verbal Permission Granted     Permission granted to share info w AGENCY: SNFs        Emotional Assessment       Orientation: : Fluctuating Orientation (Suspected and/or reported Sundowners) Alcohol / Substance Use: Not Applicable Psych Involvement: No (comment)  Admission diagnosis:  Ureterolithiasis [N20.1] Delirium [R41.0] Septic  shock ([place]) [A41.9, R65.21] Sepsis ([place]) [A41.9] Urinary tract  infection without hematuria, site unspecified [N39.0] Hydronephrosis with ureteropelvic junction (UPJ) obstruction [Q62.11] Dementia without behavioral disturbance, unspecified dementia type ([place]) [F03.90] Acute renal failure superimposed on chronic kidney disease, unspecified CKD stage, unspecified acute renal failure type ([place]) [N17.9, N18.9] Patient Active Problem List   Diagnosis Date Noted  . Sepsis ([place]) 01/23/2020  . Acute metabolic encephalopathy 01/10/4974  . Lactic acidosis 12/24/2019  . Acute renal failure superimposed on stage 4 chronic kidney disease ([place]) 12/24/2019  . UTI (urinary tract infection) 12/23/2019  . Long term (current) use of anticoagulants 07/31/2018  . SSS (sick sinus syndrome) ([place]) 11/25/2017  . Paroxysmal atrial fibrillation ([place]) 11/25/2017  . Pacemaker 11/25/2017  . History of pulmonary embolism 11/25/2017  . CKD (chronic kidney disease) stage 4, GFR 15-29 ml/min (HCC) 11/25/2017  . Essential hypertension 11/25/2017  . Hypercholesterolemia 11/25/2017   PCP:  Marda Stalker, PA-C Pharmacy:   [place], [address] [place] [address] Phone: [phone] Fax: [phone]     Social Determinants of Health (SDOH) Interventions    Readmission Risk Interventions Readmission Risk Prevention Plan 01/29/2020  Transportation Screening Complete  PCP or Specialist Appt within 5-7 Days Complete  Home Care Screening Complete  Medication Review (RN CM) Complete

## 2020-01-29 NOTE — Evaluation (Signed)
Occupational Therapy Evaluation Patient Details Name: Hailey Lopez MRN: [id number] DOB: 1929-02-01 Today's Date: 01/29/2020    History of Present Illness Per MD notes: Pt is an 87 year old female with past medical history of A. fib-on Coumadin, CKD stage IV, hypertension, hyperlipidemia, CVA, Alzheimer's dementia presents to emergency department with vomiting, generalized weakness, one episode of diarrhea and syncope.  MD assessment includes: Severe sepsis in the setting of obstructive uropathy/complicated Ecoli UTI, E. coli/Enterobacterales bacteremia, AKI on CKD stage IV, Elevated troponin likely demand ischemia, jerking movements of unkown etiology, HTN, hyponatremia, thrombocytopenia, Left proximal ureteral calculus with obstruction s/p cystoscopy and left ureteral stent placement, and debility.   Clinical Impression   Pt seen for OT evaluation this date in setting of acute hospitalization with bacteremia. Pt unable to provide PLOF information to OT and is non-verbal with therapist throughout assessment and attempts to engage in ADLs. Pt's daughter reported to PT that pt was somewhat able to walk with hand held assist at baseline and was somewhat participatory in self care although she did require assist in all aspects. On assessment this date, pt requires MAX A with hand over hand for bed level UB ADLs, TOTAL A for bed level LB ADLs and only meaningfully follows ~5% of simple commands. Will continue to follow acutely. Pt will require SNF upon d/c from acute setting d/t decline in functional performance.    Follow Up Recommendations  SNF    Equipment Recommendations  Other (comment) (defer to next level of care)    Recommendations for Other Services       Precautions / Restrictions Precautions Precautions: Fall Restrictions Weight Bearing Restrictions: No      Mobility Bed Mobility               General bed mobility comments: attempt to engage pt in rolling to no avail as  she is resistive. Anticiapte TOTAL A +2 would be required as she is strong enough to resist helping with rolling for ADL purposes such as peri care.    Transfers                 General transfer comment: unable/unsafe, pt unable to follow majority of cueing.    Balance       Sitting balance - Comments: unable to assess on OT Evaluation       Standing balance comment: Unable to assess on OT Evaluation                           ADL either performed or assessed with clinical judgement   ADL Overall ADL's : Needs assistance/impaired                                       General ADL Comments: MAX A With hand over hand for bed level UB ADLs, TOTAL A with LB ADLs at bed level. Pt unsafe to attempt ADL transfers with at this time.     Vision   Additional Comments: diffiuclt to formally assess d/t pt with limited visual attn throughout session     Perception     Praxis      Pertinent Vitals/Pain Pain Assessment: Faces Faces Pain Scale: No hurt     Hand Dominance     Extremity/Trunk Assessment Upper Extremity Assessment Upper Extremity Assessment: Generalized weakness   Lower Extremity Assessment Lower Extremity Assessment: Generalized weakness  Communication Communication Communication: Expressive difficulties;Receptive difficulties (pt is non-verbal with OT throughout session)   Cognition Arousal/Alertness: Awake/alert Behavior During Therapy: Flat affect Overall Cognitive Status: Impaired/Different from baseline Area of Impairment: Following commands;Attention;Awareness                   Current Attention Level: Divided   Following Commands: Follows one step commands inconsistently   Awareness: Anticipatory   General Comments: Pt with very little visual attn with OT throughout session. Pt follows only ~5% of simple one step commands with increased time and multimodal cues.   General Comments       Exercises  Other Exercises Other Exercises: OT attempts to engage pt in some level of therapeutic activity or self care at bed level and stimulate pt to attend with lights on, HOB elevated, and auditory stimuli. Pt only follows ~5% of simple one-step commands this session including lifting her head for OT To adjust pillow. Pt with potentially rote movement, but not active participation, does clear a warm wash rag from her face, but does not actively try to participate in self care/hygiene tasks such as washing her face. In addition, OT attempts to stimulate pt to take a drink of her ensrure. OT places it in hand and pt requires MAX A with hand over hand to perform hand to mouth, using straw. But ultimately, despite getting it to her mouth, pt will not sip.   Shoulder Instructions      Home Living Family/patient expects to be discharged to:: Private residence Living Arrangements: Other relatives Available Help at Discharge: Family;Available 24 hours/day Type of Home: Apartment Home Access: Elevator     Home Layout: One level         Bathroom Toilet: Handicapped height     Home Equipment: Bedside commode;Wheelchair - manual;Grab bars - tub/shower   Additional Comments: tub transfer bench; lives with niece and niece's husband      Prior Functioning/Environment Level of Independence: Needs assistance  Gait / Transfers Assistance Needed: HHA with ambulation HH distances with w/c for community access, no fall history ADL's / Homemaking Assistance Needed: Assist from family with all ADLs   Comments: Pt unable to provide hx to this Chief Strategy Officer. PLOF and equipment information collected from PT note when pt's daughter was present.        OT Problem List: Decreased strength;Decreased range of motion;Decreased activity tolerance;Impaired balance (sitting and/or standing);Decreased cognition;Decreased safety awareness;Decreased knowledge of use of DME or AE;Decreased knowledge of precautions      OT  Treatment/Interventions: Self-care/ADL training;Therapeutic activities;Therapeutic exercise;Patient/family education;Balance training;Cognitive remediation/compensation    OT Goals(Current goals can be found in the care plan section) Acute Rehab OT Goals Patient Stated Goal: none stated OT Goal Formulation: Patient unable to participate in goal setting Time For Goal Achievement: 02/12/20 Potential to Achieve Goals: Poor ADL Goals Pt Will Perform Eating: with min assist;with mod assist;with caregiver independent in assisting (with pt completing at least 1 step of task) Pt Will Perform Grooming: with mod assist;with caregiver independent in assisting (with pt completing at least 1 step of task) Pt Will Perform Upper Body Dressing: with mod assist;with caregiver independent in assisting (with pt completing at least one step of task)  OT Frequency: Min 1X/week   Barriers to D/C:            Co-evaluation              AM-PAC OT "6 Clicks" Daily Activity     Outcome Measure Help from  another person eating meals?: A Lot Help from another person taking care of personal grooming?: A Lot Help from another person toileting, which includes using toliet, bedpan, or urinal?: Total Help from another person bathing (including washing, rinsing, drying)?: Total Help from another person to put on and taking off regular upper body clothing?: A Lot Help from another person to put on and taking off regular lower body clothing?: Total 6 Click Score: 9   End of Session Nurse Communication: Mobility status  Activity Tolerance: Other (comment) (limited by cognition coupled with infection) Patient left: in bed;with call bell/phone within reach;with bed alarm set  OT Visit Diagnosis: Other abnormalities of gait and mobility (R26.89);Muscle weakness (generalized) (M62.81);Ataxia, unspecified (R27.0);Feeding difficulties (R63.3);Adult, failure to thrive (R62.7)                Time: 4492-0100 OT Time  Calculation (min): 18 min Charges:  OT General Charges $OT Visit: 1 Visit OT Evaluation $OT Eval Moderate Complexity: 1 Mod OT Treatments $Self Care/Home Management : 8-22 mins  Gerrianne Scale, MS, OTR/L ascom [phone] 01/29/20, 3:32 PM

## 2020-01-29 NOTE — Progress Notes (Signed)
Attempted call to niece for Readmission Prevention Screening and to discuss SNF recommendation. Left a voicemail requesting a return call.  Oleh Genin, [place]

## 2020-01-29 NOTE — Progress Notes (Signed)
Attempt to give patient meds x 2/ Turned head and would not open mouth to accept

## 2020-01-30 DIAGNOSIS — F0390 Unspecified dementia without behavioral disturbance: Secondary | ICD-10-CM

## 2020-01-30 DIAGNOSIS — N201 Calculus of ureter: Secondary | ICD-10-CM

## 2020-01-30 DIAGNOSIS — R410 Disorientation, unspecified: Secondary | ICD-10-CM

## 2020-01-30 DIAGNOSIS — N390 Urinary tract infection, site not specified: Secondary | ICD-10-CM | POA: Diagnosis not present

## 2020-01-30 DIAGNOSIS — N179 Acute kidney failure, unspecified: Secondary | ICD-10-CM | POA: Diagnosis not present

## 2020-01-30 LAB — COMPREHENSIVE METABOLIC PANEL
ALT: 18 U/L (ref 0–44)
AST: 40 U/L (ref 15–41)
Albumin: 2.6 g/dL — ABNORMAL LOW (ref 3.5–5.0)
Alkaline Phosphatase: 53 U/L (ref 38–126)
Anion gap: 8 (ref 5–15)
BUN: 30 mg/dL — ABNORMAL HIGH (ref 8–23)
CO2: 27 mmol/L (ref 22–32)
Calcium: 9 mg/dL (ref 8.9–10.3)
Chloride: 108 mmol/L (ref 98–111)
Creatinine, Ser: 2.02 mg/dL — ABNORMAL HIGH (ref 0.44–1.00)
GFR, Estimated: 23 mL/min — ABNORMAL LOW (ref >60)
Glucose, Bld: 112 mg/dL — ABNORMAL HIGH (ref 70–99)
Potassium: 3.7 mmol/L (ref 3.5–5.1)
Sodium: 143 mmol/L (ref 135–145)
Total Bilirubin: 0.6 mg/dL (ref 0.3–1.2)
Total Protein: 6.5 g/dL (ref 6.5–8.1)

## 2020-01-30 LAB — CBC
HCT: 32 % — ABNORMAL LOW (ref 36.0–46.0)
Hemoglobin: 10.8 g/dL — ABNORMAL LOW (ref 12.0–15.0)
MCH: 30.8 pg (ref 26.0–34.0)
MCHC: 33.8 g/dL (ref 30.0–36.0)
MCV: 91.2 fL (ref 80.0–100.0)
Platelets: 316 10*3/uL (ref 150–400)
RBC: 3.51 MIL/uL — ABNORMAL LOW (ref 3.87–5.11)
RDW: 14.9 % (ref 11.5–15.5)
WBC: 7.2 10*3/uL (ref 4.0–10.5)
nRBC: 0 % (ref 0.0–0.2)

## 2020-01-30 LAB — PROTIME-INR
INR: 1.8 — ABNORMAL HIGH (ref 0.8–1.2)
Prothrombin Time: 20.5 seconds — ABNORMAL HIGH (ref 11.4–15.2)

## 2020-01-30 LAB — MAGNESIUM: Magnesium: 1.8 mg/dL (ref 1.7–2.4)

## 2020-01-30 MED ORDER — WARFARIN SODIUM 2.5 MG PO TABS
2.5000 mg | ORAL_TABLET | Freq: Once | ORAL | Status: AC
  Administered 2020-01-30: 2.5 mg via ORAL
  Filled 2020-01-30: qty 1

## 2020-01-30 MED ORDER — MIRTAZAPINE 15 MG PO TBDP
15.0000 mg | ORAL_TABLET | Freq: Every day | ORAL | Status: DC
  Administered 2020-01-31 – 2020-02-03 (×4): 15 mg via ORAL
  Filled 2020-01-30 (×7): qty 1

## 2020-01-30 NOTE — Progress Notes (Signed)
Physical Therapy Treatment Patient Details Name: Hailey Lopez MRN: [id number] DOB: 09/03/1929 Today's Date: 01/30/2020    History of Present Illness Per MD notes: Pt is an 87 year old female with past medical history of A. fib-on Coumadin, CKD stage IV, hypertension, hyperlipidemia, CVA, Alzheimer's dementia presents to emergency department with vomiting, generalized weakness, one episode of diarrhea and syncope.  MD assessment includes: Severe sepsis in the setting of obstructive uropathy/complicated Ecoli UTI, E. coli/Enterobacterales bacteremia, AKI on CKD stage IV, Elevated troponin likely demand ischemia, jerking movements of unkown etiology, HTN, hyponatremia, thrombocytopenia, Left proximal ureteral calculus with obstruction s/p cystoscopy and left ureteral stent placement, and debility.    PT Comments    Pt nonverbal during the session and followed very few commands but did actively participate at times during the session.  Pt assisted with left and right rolling activities as well as with some therapeutic exercises.  Most exercises to the BUEs and BLEs were PROM but at times pt did assist with the movements.  Pt also tolerated sitting at the EOB much better than during the previous session. The pt was able to maintain static sitting position without physical assistance 50% of the time this session compared to near 0% of the time the previous session.  Pt will benefit from a trial of PT services in a SNF setting upon discharge to safely address deficits listed in patient problem list for decreased caregiver assistance and eventual return to PLOF.   Follow Up Recommendations  SNF     Equipment Recommendations  Other (comment) (TBD at next venue of care)    Recommendations for Other Services       Precautions / Restrictions Precautions Precautions: Fall Restrictions Weight Bearing Restrictions: No    Mobility  Bed Mobility Overal bed mobility: Needs Assistance Bed Mobility:  Supine to Sit;Sit to Supine;Rolling Rolling: Mod assist;Max assist   Supine to sit: +2 for physical assistance;Total assist Sit to supine: Total assist;+2 for physical assistance   General bed mobility comments: Pt provided no assistance with sup to/from sit but would provide some assistance with rolling L/R once task was initiated for patient  Transfers                 General transfer comment: unable/unsafe, pt unable to follow commands  Ambulation/Gait                 Stairs             Wheelchair Mobility    Modified Rankin (Stroke Patients Only)       Balance Overall balance assessment: Needs assistance   Sitting balance-Leahy Scale: Poor Sitting balance - Comments: Pt able to maintain static sitting position 50% of the time with SBA and the rest with min-mod A                                    Cognition Arousal/Alertness: Awake/alert Behavior During Therapy: Flat affect Overall Cognitive Status: Impaired/Different from baseline Area of Impairment: Following commands;Attention;Awareness                               General Comments: Pt able to follow simple commands with extensive multi-modal cuing <5% of the time      Exercises Other Exercises Other Exercises: Extensive BUE and BLE AA/PROM in available planes in supine to patient's tolerance Other Exercises: Rolling  activities left/right to assist with hygiene with pt providing some assistance each time    General Comments        Pertinent Vitals/Pain Pain Assessment: Faces Faces Pain Scale: No hurt    Home Living                      Prior Function            PT Goals (current goals can now be found in the care plan section) Progress towards PT goals: Progressing toward goals    Frequency    Min 2X/week      PT Plan Current plan remains appropriate    Co-evaluation              AM-PAC PT "6 Clicks" Mobility   Outcome  Measure  Help needed turning from your back to your side while in a flat bed without using bedrails?: A Lot Help needed moving from lying on your back to sitting on the side of a flat bed without using bedrails?: Total Help needed moving to and from a bed to a chair (including a wheelchair)?: Total Help needed standing up from a chair using your arms (e.g., wheelchair or bedside chair)?: Total Help needed to walk in hospital room?: Total Help needed climbing 3-5 steps with a railing? : Total 6 Click Score: 7    End of Session   Activity Tolerance: Patient tolerated treatment well Patient left: in bed;with nursing/sitter in room;with call bell/phone within reach;with bed alarm set Nurse Communication: Mobility status PT Visit Diagnosis: Difficulty in walking, not elsewhere classified (R26.2);Muscle weakness (generalized) (M62.81)     Time: 0102-7253 PT Time Calculation (min) (ACUTE ONLY): 39 min  Charges:  $Therapeutic Exercise: 23-37 mins $Therapeutic Activity: 8-22 mins                     D. Scott Kameren Pargas PT, DPT 01/30/20, 5:11 PM

## 2020-01-30 NOTE — Progress Notes (Signed)
Calorie Count Note  48 hour calorie count ordered. Day 2 results:  Diet: dysphagia 1 with thin Supplements: Ensure Enlive po TID, Magic Cup po BID  Dinner 1/11: 506 kcal, 17 grams of protein (30% puree pork with gravy, 50% mashed potatoes, with gravy, 10% puree carrots, 100% puree pears, 1 Magic Cup) Breakfast 1/12: 196 kcal, 10 grams of protein (25% of meal) Lunch: 51 kcal, 2 grams of protein (5% of meal) Supplements: 263 kcal, 15 grams of protein (75% Ensure Enlive)  Total intake Day 2: 1016 kcal (64% of minimum estimated needs)  44 grams of protein (55% of minimum estimated needs)  Estimated Nutritional Needs: Kcal:1600-1800 Protein:80-90 grams Fluid:1.6 L/day  Nutrition Dx: Inadequate oral intakerelated to decreased appetiteas evidenced by per patient/family report,meal completion < 50%.  Goal: Patient will meet greater than or equal to 90% of their needs  Intervention:  -Continue Ensure Enlive po TID, each supplement provides 350 kcal and 20 grams of protein. -Increase to Magic cup po TID with meals, each supplement provides 290 kcal and 9 grams of protein. -Continue MVI po daily. -Consider addition of appetite stimulant.  Patient's niece not present at bedside today. Suspect this is why she ate less today.   Jacklynn Barnacle, MS, RD, LDN Pager number available on Amion

## 2020-01-30 NOTE — Progress Notes (Signed)
ANTICOAGULATION CONSULT NOTE  Pharmacy Consult for warfarin Indication: atrial fibrillation   Labs: Recent Labs    01/28/20 0238 01/29/20 0454 01/30/20 0241  HGB 10.3*  --  10.8*  HCT 30.9*  --  32.0*  PLT 192  --  316  LABPROT 30.4* 24.6* 20.5*  INR 3.0* 2.3* 1.8*  CREATININE 2.13* 2.03* 2.02*    Estimated Creatinine Clearance: 16.6 mL/min (A) (by C-G formula based on SCr of 2.02 mg/dL (H)).   Medical History: Past Medical History:  Diagnosis Date  . Alzheimer's disease ([place])   . Atrial fibrillation ([place])   . Chronic kidney disease    Stage 4  . CVA (cerebrovascular accident) ([place])   . DVT (deep venous thrombosis) ([place]) 2013   LLE  . Hyperlipidemia   . Hypertension   . Pacemaker   . Pulmonary embolism [facility])     Assessment: 87 year old female presented with vomiting and diarrhea. Found to have left proximal ureteral calculus with obstruction, now s/p left ureteral stent placement. Blood cultures with 4/4 bottles E.coli, likely urinary source. Patient with h/o afib on warfarin PTA. Last known home dose warfarin 3 mg daily. INR therapeutic on admission.  Drug-drug interactions: ancef, cephalexin  Albumin: 3.3 >> 2.4 >> 2.2 >> 2.4  Date INR Dose 1/4 2.1 3 mg (patient reported PTA) 1/5 -- Held 1/6 3.0 Held 1/7 2.6 3 mg 1/8 2.6 3 mg 1/9 2.9 2 mg 1/10     3.0 DOSE HELD 1/11 2.3       2 mg 1/12     1.8  Goal of Therapy:  INR 2-3 Monitor platelets by anticoagulation protocol: Yes   Plan:  --INR is slightly subtherapeutic today. Will order warfarin 2.5 mg x 1 dose today. CBC stable  --Continue to monitor INR daily per protocol and CBC at least every 3 days.   Paulina Fusi, PharmD, BCPS 01/30/2020 9:29 AM

## 2020-01-30 NOTE — Progress Notes (Signed)
PROGRESS NOTE    Hailey Lopez  DEY:[id number] DOB: 06-Jun-1929 DOA: 01/22/2020 PCP: Marda Stalker, PA-C   Brief Narrative: Taken from prior notes. Patient is 87 year old female with past medical history of A. fib-on Coumadin, CKD stage IV, hypertension, hyperlipidemia, CVA, Alzheimer's dementia presents to emergency department with vomiting, generalized weakness, one episode of diarrhea and syncope.  ED course: Patient afebrile, v BP: 145/108, pulse: 136, EKG shows sinus tachycardia and nonspecific ST-T wave changes.  Sodium 146, bicarb 20, anion gap 16, BUN 44, creatinine: 3.45, lactic acid 8.1, procalcitonin 80.51, WBC 11.7, INR: 2.1.  UA is consistent with UTI.  Chest x-ray negative for acute findings.  CT head is negative.  CT abdomen and pelvis showed mild left hydronephrosis with perinephric stranding due to obstructing 15 mm calculus in the left Vitro pelvic junction.  She met sepsis criteria therefore she was given 2 L of IV fluid bolus and started on cefepime.  Urology was consulted.  She tested negative for COVID-19.  Patient admitted under PCCM.  [facility] took over care from 01/23/2018.  Patient was treated for severe sepsis secondary to obstructive uropathy and complicated E. coli and Proteus UTI and E. coli/Enterobacter bacteremia. S/p ureteral stent placement by urology.   Subjective: Patient appears very withdrawn and not talking.  Niece was at bedside who is also her caregiver.  According to her she has not spoken a word for the past 2 days and is refusing all p.o. intake.  She was interested to get some information regarding hospice as she really think that patient is getting closer to that.  She appears comfortable and following some commands.  Assessment & Plan:   Active Problems:   Sepsis ([place])  Severe sepsis in the setting of obstructive uropathy/complicated Ecoli & Proteus UTI, E. coli/Enterobacterales bacteremia: -Status post left ureteral stent placement  by urology.  POD 5. She was initially managed in ICU and received broad-spectrum antibiotics which were narrow down to cefazolin after susceptibility results. Plan was to switch to p.o. Keflex from today, patient is not taking any p.o. intake for the past 2 days and appears very withdrawn.  Stopped talking even with her niece. Clinically improving with improvement in leukocytosis and procalcitonin.  Remained afebrile. She will need 14 days of antibiotics and last day of therapy would be on 02/06/2020. -Palliative consult at the request of niece-patient refusing p.o. intake and appears very withdrawn and not talking at all. -Continue with antibiotics.  AKI with CKD stage IV.  Baseline creatinine around 1.9.  Slowly improving, on admission it was 3.87, today it was 2.02, quite close to baseline. Nephrology was consulted and she is not a candidate for long-term dialysis. -Continue to monitor. -Avoid nephrotoxins  Acute metabolic encephalopathy.  Most likely with sepsis.  Per niece she did had some underlying dementia. CT head shows no acute changes.  Advanced senescent  changes and borderline ventricular enlargement. B12, TSH and folate are within normal limit.  Ammonia within normal limit. Patient appears more withdrawn and refusing all p.o. intake. -Palliative care consult.-Might need hospice care if continued to refuse p.o. intake. -PT is recommending SNF placement.  Elevated troponin: -Troponin 214 trended up to 764-819.  EKG: No acute ST-T wave changes noted.  Likely in the setting of demand ischemia in the setting of sepsis -Continue to monitor.  Patient denies any ACS symptoms.  Jerking movements: -Unknown etiology?.  Improving as per RN & the family.  Reviewed meds with pharmacy.  Vitals remained stable.  Could  be secondary to pain/discomfort due to purewick.  Continue oxycodone and Ativan as needed. -EEG: No seizure, diffuse encephalopathy  Hypertension.  Blood pressure within  goal. -Continue Isordil.  History of PE and DVT: Continue Coumadin as per pharmacy. - Monitor PT/INR  Hyperlipidemia: Continue atorvastatin  Alzheimer's dementia: Continue donepezil  Hypernatremia.  Most likely secondary to poor p.o. intake.  Resolved with IV fluid. -Continue gentle IV fluid with D5 till she started taking p.o. or family decided about comfort measures only.  Thrombocytopenia: Resolved.  No signs of active bleeding.   Objective: Vitals:   01/29/20 1546 01/29/20 2111 01/29/20 2331 01/30/20 0421  BP: (!) 145/78 136/80 98/69 105/78  Pulse: 73 78 66 67  Resp: '16 15 15 16  ' Temp: 98.5 F (36.9 C) 99 F (37.2 C) 98.6 F (37 C) 97.7 F (36.5 C)  TempSrc:      SpO2: 97% 99% 100% 100%  Weight:      Height:        Intake/Output Summary (Last 24 hours) at 01/30/2020 0808 Last data filed at 01/29/2020 2115 Gross per 24 hour  Intake 240 ml  Output 300 ml  Net -60 ml   Filed Weights   01/27/20 0035 01/27/20 0613 01/29/20 0144  Weight: 63.8 kg 63.2 kg 63 kg    Examination:  General exam: Chronically ill-appearing, elderly lady, appears calm and comfortable , following some commands. Respiratory system: Clear to auscultation. Respiratory effort normal. Cardiovascular system: S1 & S2 heard, RRR. Gastrointestinal system: Soft, nontender, nondistended, bowel sounds positive. Central nervous system: Alert but not participating with her care. No obvious focal neurological deficits. Extremities: No edema, no cyanosis, pulses intact and symmetrical. Psychiatry: Judgement and insight appear impaired   DVT prophylaxis: Coumadin Code Status: DNR Family Communication: Niece was updated at bedside. Disposition Plan:  Status is: Inpatient  Remains inpatient appropriate because:Inpatient level of care appropriate due to severity of illness   Dispo: The patient is from: Home              Anticipated d/c is to: To be determined              Anticipated d/c date is: 1  day              Patient currently is not medically stable to d/c.   Consultants:   PCCM  ID  Nephrology  urology  Procedures:   Left ureteral stent placement  Antimicrobials: Cefazolin  Data Reviewed: I have personally reviewed following labs and imaging studies  CBC: Recent Labs  Lab 01/24/20 0643 01/25/20 0219 01/26/20 0446 01/27/20 0428 01/28/20 0238 01/30/20 0241  WBC 15.2* 11.7* 8.1 7.4 6.5 7.2  NEUTROABS 13.0* 9.5* 5.7  --   --   --   HGB 11.7* 10.6* 10.4* 10.4* 10.3* 10.8*  HCT 34.3* 32.0* 31.6* 31.7* 30.9* 32.0*  MCV 91.2 92.5 92.9 92.2 91.2 91.2  PLT 117* 120* 121* 148* 192 263   Basic Metabolic Panel: Recent Labs  Lab 01/26/20 0446 01/27/20 0428 01/28/20 0238 01/29/20 0454 01/30/20 0241  NA 149* 148* 146* 147* 143  K 3.7 3.8 3.9 4.0 3.7  CL 118* 118* 113* 113* 108  CO2 '23 23 25 23 27  ' GLUCOSE 141* 120* 112* 104* 112*  BUN 35* 31* 33* 33* 30*  CREATININE 2.53* 2.30* 2.13* 2.03* 2.02*  CALCIUM 9.2 9.1 9.0 9.1 9.0  MG  --  1.8  --   --  1.8   GFR: Estimated Creatinine Clearance: 16.6  mL/min (A) (by C-G formula based on SCr of 2.02 mg/dL (H)). Liver Function Tests: Recent Labs  Lab 01/25/20 0219 01/26/20 0446 01/27/20 0428 01/28/20 0238 01/30/20 0241  AST 44* 40 33 39 40  ALT '19 23 20 19 18  ' ALKPHOS 61 53 50 50 53  BILITOT 0.6 0.5 0.8 0.6 0.6  PROT 5.7* 5.4* 5.6* 5.9* 6.5  ALBUMIN 2.2* 2.2* 2.2* 2.4* 2.6*   No results for input(s): LIPASE, AMYLASE in the last 168 hours. Recent Labs  Lab 01/29/20 1155  AMMONIA 18   Coagulation Profile: Recent Labs  Lab 01/26/20 0446 01/27/20 0428 01/28/20 0238 01/29/20 0454 01/30/20 0241  INR 2.6* 2.9* 3.0* 2.3* 1.8*   Cardiac Enzymes: No results for input(s): CKTOTAL, CKMB, CKMBINDEX, TROPONINI in the last 168 hours. BNP (last 3 results) No results for input(s): PROBNP in the last 8760 hours. HbA1C: No results for input(s): HGBA1C in the last 72 hours. CBG: Recent Labs  Lab  01/24/20 1115 01/24/20 1640 01/24/20 2002 01/25/20 0813 01/25/20 1205  GLUCAP 110* 104* 68* 94 107*   Lipid Profile: No results for input(s): CHOL, HDL, LDLCALC, TRIG, CHOLHDL, LDLDIRECT in the last 72 hours. Thyroid Function Tests: Recent Labs    01/29/20 0454  TSH 3.821   Anemia Panel: Recent Labs    01/28/20 0238 01/29/20 0454  VITAMINB12 1,071*  --   FOLATE  --  8.0   Sepsis Labs: Recent Labs  Lab 01/24/20 0643 01/24/20 1546 01/24/20 1547 01/24/20 1858 01/25/20 0219 01/25/20 0757 01/25/20 1137 01/26/20 0446  PROCALCITON 136.62  --  109.53  --  70.41  --   --  33.37  LATICACIDVEN  --  2.7*  --  3.1*  --  1.3 1.7  --     Recent Results (from the past 240 hour(s))  Urine culture     Status: Abnormal   Collection Time: 01/23/20 12:13 AM   Specimen: Urine, Random  Result Value Ref Range Status   Specimen Description   Final    URINE, RANDOM Performed at [facility], [address] [place], [place] [address]    Special Requests   Final    NONE Performed at [facility], [address] [place], [place] [address]    Culture >=100,000 COLONIES/mL ESCHERICHIA COLI (A)  Final   Report Status 01/25/2020 FINAL  Final   Organism ID, Bacteria ESCHERICHIA COLI (A)  Final      Susceptibility   Escherichia coli - MIC*    AMPICILLIN >=32 RESISTANT Resistant     CEFAZOLIN <=4 SENSITIVE Sensitive     CEFEPIME <=0.12 SENSITIVE Sensitive     CEFTRIAXONE <=0.25 SENSITIVE Sensitive     CIPROFLOXACIN <=0.25 SENSITIVE Sensitive     GENTAMICIN <=1 SENSITIVE Sensitive     IMIPENEM <=0.25 SENSITIVE Sensitive     NITROFURANTOIN <=16 SENSITIVE Sensitive     TRIMETH/SULFA >=320 RESISTANT Resistant     AMPICILLIN/SULBACTAM 4 SENSITIVE Sensitive     PIP/TAZO <=4 SENSITIVE Sensitive     * >=100,000 COLONIES/mL ESCHERICHIA COLI  Blood Culture (routine x 2)     Status: Abnormal   Collection Time: 01/23/20 12:13 AM   Specimen: BLOOD  Result Value Ref Range  Status   Specimen Description   Final    BLOOD LEFT ANTECUBITAL Performed at [facility], [address] [place], [place] [address]    Special Requests   Final    BOTTLES DRAWN AEROBIC AND ANAEROBIC Blood Culture results may not be optimal  due to an inadequate volume of blood received in culture bottles Performed at [facility], [address] [place], [place] [address]    Culture  Setup Time   Final    GRAM NEGATIVE RODS IN BOTH AEROBIC AND ANAEROBIC BOTTLES CRITICAL VALUE NOTED.  VALUE IS CONSISTENT WITH PREVIOUSLY REPORTED AND CALLED VALUE. Performed at [facility], [address] [place], [place] [address]    Culture (A)  Final    ESCHERICHIA COLI SUSCEPTIBILITIES PERFORMED ON PREVIOUS CULTURE WITHIN THE LAST 5 DAYS. Performed at [facility], [place] [address] [place], [place] [address]    Report Status 01/25/2020 FINAL  Final  Blood Culture (routine x 2)     Status: Abnormal   Collection Time: 01/23/20 12:13 AM   Specimen: BLOOD  Result Value Ref Range Status   Specimen Description   Final    BLOOD BLOOD RIGHT FOREARM Performed at [facility], [address], [place], [place] [address]    Special Requests   Final    BOTTLES DRAWN AEROBIC AND ANAEROBIC Blood Culture results may not be optimal due to an inadequate volume of blood received in culture bottles Performed at [facility], [address] [place], [place] [address]    Culture  Setup Time   Final    GRAM NEGATIVE RODS IN BOTH AEROBIC AND ANAEROBIC BOTTLES CRITICAL RESULT CALLED TO, READ BACK BY AND VERIFIED WITH: M.SLAUGHTER,PHARMD AT 1012 ON 01/23/20 BY GM Performed at [facility], [place] [address], [place], [place] [address]    Culture ESCHERICHIA COLI (A)  Final   Report Status 01/25/2020 FINAL  Final   Organism ID, Bacteria ESCHERICHIA COLI  Final      Susceptibility   Escherichia coli - MIC*    AMPICILLIN >=32 RESISTANT Resistant      CEFAZOLIN <=4 SENSITIVE Sensitive     CEFEPIME <=0.12 SENSITIVE Sensitive     CEFTAZIDIME <=1 SENSITIVE Sensitive     CEFTRIAXONE <=0.25 SENSITIVE Sensitive     CIPROFLOXACIN <=0.25 SENSITIVE Sensitive     GENTAMICIN <=1 SENSITIVE Sensitive     IMIPENEM <=0.25 SENSITIVE Sensitive     TRIMETH/SULFA >=320 RESISTANT Resistant     AMPICILLIN/SULBACTAM 4 SENSITIVE Sensitive     PIP/TAZO <=4 SENSITIVE Sensitive     * ESCHERICHIA COLI  SARS CORONAVIRUS 2 (TAT 6-24 HRS) Nasopharyngeal Nasopharyngeal Swab     Status: None   Collection Time: 01/23/20 12:13 AM   Specimen: Nasopharyngeal Swab  Result Value Ref Range Status   SARS Coronavirus 2 NEGATIVE NEGATIVE Final    Comment: (NOTE) SARS-CoV-2 target nucleic acids are NOT DETECTED.  The SARS-CoV-2 RNA is generally detectable in upper and lower respiratory specimens during the acute phase of infection. Negative results do not preclude SARS-CoV-2 infection, do not rule out co-infections with other pathogens, and should not be used as the sole basis for treatment or other patient management decisions. Negative results must be combined with clinical observations, patient history, and epidemiological information. The expected result is Negative.  Fact Sheet for Patients: SugarRoll.be  Fact Sheet for Healthcare Providers: https://www.woods-mathews.com/  This test is not yet approved or cleared by the [place] FDA and  has been authorized for detection and/or diagnosis of SARS-CoV-2 by FDA under an Emergency Use Authorization (EUA). This EUA will remain  in effect (meaning this test can be used) for the duration of the COVID-19 declaration under Se ction 564(b)(1) of the Act, 21 U.S.C. section 360bbb-3(b)(1), unless  the authorization is terminated or revoked sooner.  Performed at [facility], [place] [address] [place], [place] [address]   Blood Culture ID Panel (Reflexed)     Status:  Abnormal   Collection Time: 01/23/20 12:13 AM  Result Value Ref Range Status   Enterococcus faecalis NOT DETECTED NOT DETECTED Final   Enterococcus Faecium NOT DETECTED NOT DETECTED Final   Listeria monocytogenes NOT DETECTED NOT DETECTED Final   Staphylococcus species NOT DETECTED NOT DETECTED Final   Staphylococcus aureus (BCID) NOT DETECTED NOT DETECTED Final   Staphylococcus epidermidis NOT DETECTED NOT DETECTED Final   Staphylococcus lugdunensis NOT DETECTED NOT DETECTED Final   Streptococcus species NOT DETECTED NOT DETECTED Final   Streptococcus agalactiae NOT DETECTED NOT DETECTED Final   Streptococcus pneumoniae NOT DETECTED NOT DETECTED Final   Streptococcus pyogenes NOT DETECTED NOT DETECTED Final   A.calcoaceticus-baumannii NOT DETECTED NOT DETECTED Final   Bacteroides fragilis NOT DETECTED NOT DETECTED Final   Enterobacterales DETECTED (A) NOT DETECTED Final    Comment: Enterobacterales represent a large order of gram negative bacteria, not a single organism. CRITICAL RESULT CALLED TO, READ BACK BY AND VERIFIED WITH: M.SLAUGHTER,PHARMD AT 1012 ON 01/23/20 BY GM    Enterobacter cloacae complex NOT DETECTED NOT DETECTED Final   Escherichia coli DETECTED (A) NOT DETECTED Final    Comment: CRITICAL RESULT CALLED TO, READ BACK BY AND VERIFIED WITH: M.SLAUGHTER,PHARMD AT 1012 ON 01/23/20 BY GM    Klebsiella aerogenes NOT DETECTED NOT DETECTED Final   Klebsiella oxytoca NOT DETECTED NOT DETECTED Final   Klebsiella pneumoniae NOT DETECTED NOT DETECTED Final   Proteus species NOT DETECTED NOT DETECTED Final   Salmonella species NOT DETECTED NOT DETECTED Final   Serratia marcescens NOT DETECTED NOT DETECTED Final   Haemophilus influenzae NOT DETECTED NOT DETECTED Final   Neisseria meningitidis NOT DETECTED NOT DETECTED Final   Pseudomonas aeruginosa NOT DETECTED NOT DETECTED Final   Stenotrophomonas maltophilia NOT DETECTED NOT DETECTED Final   Candida albicans NOT DETECTED NOT  DETECTED Final   Candida auris NOT DETECTED NOT DETECTED Final   Candida glabrata NOT DETECTED NOT DETECTED Final   Candida krusei NOT DETECTED NOT DETECTED Final   Candida parapsilosis NOT DETECTED NOT DETECTED Final   Candida tropicalis NOT DETECTED NOT DETECTED Final   Cryptococcus neoformans/gattii NOT DETECTED NOT DETECTED Final   CTX-M ESBL NOT DETECTED NOT DETECTED Final   Carbapenem resistance IMP NOT DETECTED NOT DETECTED Final   Carbapenem resistance KPC NOT DETECTED NOT DETECTED Final   Carbapenem resistance NDM NOT DETECTED NOT DETECTED Final   Carbapenem resist OXA 48 LIKE NOT DETECTED NOT DETECTED Final   Carbapenem resistance VIM NOT DETECTED NOT DETECTED Final    Comment: Performed at [facility], [address] [place], [place] [address]  Urine Culture     Status: Abnormal   Collection Time: 01/23/20  4:04 AM   Specimen: Urine, Random  Result Value Ref Range Status   Specimen Description   Final    URINE, RANDOM Performed at [facility], [address] [place], [place] [address]    Special Requests   Final    NONE Performed at [facility], [address] [place], [place] [address]    Culture (A)  Final    50,000 COLONIES/mL PROTEUS MIRABILIS >=100,000 COLONIES/mL ESCHERICHIA COLI    Report Status 01/27/2020 FINAL  Final   Organism ID, Bacteria PROTEUS MIRABILIS (A)  Final   Organism ID, Bacteria ESCHERICHIA COLI (  A)  Final      Susceptibility   Escherichia coli - MIC*    AMPICILLIN >=32 RESISTANT Resistant     CEFAZOLIN <=4 SENSITIVE Sensitive     CEFEPIME <=0.12 SENSITIVE Sensitive     CEFTRIAXONE <=0.25 SENSITIVE Sensitive     CIPROFLOXACIN <=0.25 SENSITIVE Sensitive     GENTAMICIN <=1 SENSITIVE Sensitive     IMIPENEM <=0.25 SENSITIVE Sensitive     NITROFURANTOIN <=16 SENSITIVE Sensitive     TRIMETH/SULFA >=320 RESISTANT Resistant     AMPICILLIN/SULBACTAM 4 SENSITIVE Sensitive     PIP/TAZO <=4 SENSITIVE Sensitive      * >=100,000 COLONIES/mL ESCHERICHIA COLI   Proteus mirabilis - MIC*    AMPICILLIN <=2 SENSITIVE Sensitive     CEFAZOLIN <=4 SENSITIVE Sensitive     CEFEPIME <=0.12 SENSITIVE Sensitive     CEFTRIAXONE <=0.25 SENSITIVE Sensitive     CIPROFLOXACIN <=0.25 SENSITIVE Sensitive     GENTAMICIN <=1 SENSITIVE Sensitive     IMIPENEM 8 INTERMEDIATE Intermediate     NITROFURANTOIN RESISTANT Resistant     TRIMETH/SULFA <=20 SENSITIVE Sensitive     AMPICILLIN/SULBACTAM <=2 SENSITIVE Sensitive     PIP/TAZO <=4 SENSITIVE Sensitive     * 50,000 COLONIES/mL PROTEUS MIRABILIS  MRSA PCR Screening     Status: None   Collection Time: 01/23/20  5:23 AM   Specimen: Nasopharyngeal  Result Value Ref Range Status   MRSA by PCR NEGATIVE NEGATIVE Final    Comment:        The GeneXpert MRSA Assay (FDA approved for NASAL specimens only), is one component of a comprehensive MRSA colonization surveillance program. It is not intended to diagnose MRSA infection nor to guide or monitor treatment for MRSA infections. Performed at [facility], [address] [place], [place] [address]      Radiology Studies: EEG  Result Date: 01/28/2020 Lora Havens, MD     01/28/2020  2:32 PM Patient Name: Kamron Portee MRN: [id number] Epilepsy Attending: Lora Havens Referring Physician/Provider: Dr Early Osmond Date: 01/28/2020 Duration: 22.49 mins Patient history: 87yo F with ams and jerking movements. EEG to evaluate for seizure Level of alertness: Awake AEDs during EEG study: None Technical aspects: This EEG study was done with scalp electrodes positioned according to the 10-20 International system of electrode placement. Electrical activity was acquired at a sampling rate of '500Hz'  and reviewed with a high frequency filter of '70Hz'  and a low frequency filter of '1Hz' . EEG data were recorded continuously and digitally stored. Description: No posterior dominant rhythm was seen. EEG showed continuous generalized 3  to 6 Hz theta-delta slowing. Physiologic photic driving was not seen during photic stimulation. Hyperventilation was not performed.   Of note, parts of study were technically difficult due to electrode and movement artifact. ABNORMALITY -Continuous slow, generalized IMPRESSION: This technically difficult study is suggestive of moderate diffuse encephalopathy, nonspecific etiology. No seizures or epileptiform discharges were seen throughout the recording. Priyanka Barbra Sarks    Scheduled Meds: . atorvastatin  10 mg Oral Daily  . calcitRIOL  0.5 mcg Oral QHS  . cephALEXin  500 mg Oral Q8H  . Chlorhexidine Gluconate Cloth  6 each Topical Q0600  . donepezil  5 mg Oral QHS  . feeding supplement  237 mL Oral TID BM  . isosorbide dinitrate  5 mg Oral Daily  . multivitamin with minerals  1 tablet Oral Daily  . Warfarin - Pharmacist Dosing Inpatient   Does not apply q1600   Continuous Infusions: .  sodium chloride 250 mL (01/23/20 1338)  . dextrose       LOS: 7 days   Time spent: 35 minutes  Lorella Nimrod, MD [facility]  If 7PM-7AM, please contact night-coverage [email or web address]  01/30/2020, 8:08 AM   This record has been created using Systems analyst. Errors have been sought and corrected,but may not always be located. Such creation errors do not reflect on the standard of care.

## 2020-01-30 NOTE — Progress Notes (Signed)
Speech Language Pathology Treatment: Dysphagia  Patient Details Name: Hailey Lopez MRN: [id number] DOB: Nov 23, 1929 Today's Date: 01/30/2020 Time: 6237-6283 SLP Time Calculation (min) (ACUTE ONLY): 45 min  Assessment / Plan / Recommendation Clinical Impression  Pt seen for toleration of diet; she is currently on a Dysphagia level 1 (puree) w/ thin liquids. Pt is on a Calorie Count per Dietician d/t reduced/poor oral intake -- NSG stated pt would accept a few more po's from the Niece when she is here to help feed pt. Pt appears to use a sippy straw cup as there are 2 cups in the room. Pt was nonverbal and did not overly engage w/ SLP even given verbal/tactile cues.  Full positioning support given to ensure pt was Upright for oral intake. Pt required Mod+ cues including a Pinched straw and multiple spoon presentations to engage mouth opening and oral acceptance; this was inconsistent at times resulting in pt not opening her mouth for the bolus. When she accepted the po trials, she appeared to present w/ grossly functional haryngeal phase swallow but is SIGNIFICANTLY impacted in her awareness during the oral acceptance/prep stage d/t declined Cognitive status, baseline Dementia. The Cognitive decline impacts her overall awareness/engagement w/ po tasks which can increase risk for aspiration. Though present, her risk for aspiration is reduced when following general aspiration precautions and when supported during the meal w/ setup and guidance w/ self-feeding. She requires Mod+ verbal/tactile/visual cues for orientation to bolus presentation and follow through w/ po's. Reducing distractions appeared helpful to allow pt to focus on Single po tasks either from [place] or SLP secondary to the Cognitive decline, Dementia.  No overt clinical s/s of aspiration noted; no cough, or decline in respiratory status during/post trials. Oral phase was grossly [facility] for bolus management and oral clearing of the boluses given  when she accepted the trials. Mod+ cues for engagement.   Discussed diet consistency of Puree foods/thin liquids; aspiration precautions; pills Crushed in puree; feeding support and supervision at meals w/ NSG. Handouts posted in room, chart. Recommend a Dysphagia level 1(puree) w/ Thin liquids; general aspiration precautions; reduce Distractions during meals. Pills Crushed in Puree for safer swallowing. Support at all meals d/t Cognitive decline/Dementia. Drink supplements to support nutrition per Dietician. Recommend continue this current diet consistency for safer oral intake d/t decreased attention to tasks and environment at D/C. If pt is able to demonstrate increased engagement and Cognitive awareness for more solid textured trials, then MD can reconsult ST services then. NSG/MD updated.     HPI HPI: Hailey Lopez is a 87 y.o. female with history of Dementia, pulmonary embolism, hypertension, hyperlipidemia, CVA, atrial fibrillation, who was admitted to [facility] on 01/22/2020 for Ureterolithiasis, s/p cystoscopy and left ureteral stent placement. Head CT revealed no acute intracranial hemorrhage or infarct, advanced senescent changes and borderline ventricular enlargement. Chest x-ray was negative for cardiopulmonary disease.      SLP Plan  All goals met       Recommendations  Diet recommendations: Dysphagia 1 (puree);Thin liquid Liquids provided via: Teaspoon;Cup;Straw (pt uses a sippy straw cup at baseline it appears in room) Medication Administration: Crushed with puree (for safer swallowing) Supervision: Staff to assist with self feeding;Full supervision/cueing for compensatory strategies Compensations: Minimize environmental distractions;Slow rate;Small sips/bites;Follow solids with liquid;Lingual sweep for clearance of pocketing Postural Changes and/or Swallow Maneuvers: Seated upright 90 degrees;Upright 30-60 min after meal                General recommendations:  (Dietician  f/u) Oral Care Recommendations: Oral care BID;Oral care before and after PO;Staff/trained caregiver to provide oral care Follow up Recommendations: None (until pt demonstrates Cognitive awareness for inc'd texture) SLP Visit Diagnosis: Dysphagia, oral phase (R13.11) Plan: All goals met       GO                 Orinda Kenner, MS, CCC-SLP Speech Language Pathologist Rehab Services [phone] [facility] 01/30/2020, 2:41 PM

## 2020-01-31 DIAGNOSIS — F0390 Unspecified dementia without behavioral disturbance: Secondary | ICD-10-CM | POA: Diagnosis not present

## 2020-01-31 DIAGNOSIS — Z515 Encounter for palliative care: Secondary | ICD-10-CM

## 2020-01-31 DIAGNOSIS — N179 Acute kidney failure, unspecified: Secondary | ICD-10-CM | POA: Diagnosis not present

## 2020-01-31 DIAGNOSIS — A419 Sepsis, unspecified organism: Secondary | ICD-10-CM | POA: Diagnosis not present

## 2020-01-31 DIAGNOSIS — R6521 Severe sepsis with septic shock: Secondary | ICD-10-CM

## 2020-01-31 DIAGNOSIS — N390 Urinary tract infection, site not specified: Secondary | ICD-10-CM | POA: Diagnosis not present

## 2020-01-31 DIAGNOSIS — N189 Chronic kidney disease, unspecified: Secondary | ICD-10-CM

## 2020-01-31 DIAGNOSIS — Z7189 Other specified counseling: Secondary | ICD-10-CM

## 2020-01-31 DIAGNOSIS — N201 Calculus of ureter: Secondary | ICD-10-CM | POA: Diagnosis not present

## 2020-01-31 LAB — PROTIME-INR
INR: 1.6 — ABNORMAL HIGH (ref 0.8–1.2)
Prothrombin Time: 18.4 seconds — ABNORMAL HIGH (ref 11.4–15.2)

## 2020-01-31 MED ORDER — WARFARIN SODIUM 3 MG PO TABS
3.0000 mg | ORAL_TABLET | Freq: Once | ORAL | Status: AC
  Administered 2020-01-31: 3 mg via ORAL
  Filled 2020-01-31: qty 1

## 2020-01-31 NOTE — Progress Notes (Signed)
Physical Therapy Treatment Patient Details Name: Hailey Lopez MRN: [id number] DOB: 13-Jan-1930 Today's Date: 01/31/2020    History of Present Illness Per MD notes: Pt is an 87 year old female with past medical history of A. fib-on Coumadin, CKD stage IV, hypertension, hyperlipidemia, CVA, Alzheimer's dementia presents to emergency department with vomiting, generalized weakness, one episode of diarrhea and syncope.  MD assessment includes: Severe sepsis in the setting of obstructive uropathy/complicated Ecoli UTI, E. coli/Enterobacterales bacteremia, AKI on CKD stage IV, Elevated troponin likely demand ischemia, jerking movements of unkown etiology, HTN, hyponatremia, thrombocytopenia, Left proximal ureteral calculus with obstruction s/p cystoscopy and left ureteral stent placement, and debility.    PT Comments    Awake and ready for session.  To EOB on left with min a x 1.  Pt rubbing stomach and BM smear noted on sheets.  Returned to supine and back up on right side of bed with min a x 1 to access BSC.  Unable to stand with +1 assist,  +2 called and mod a x 2 to commode and back to bed.  Peri care provided along with linen change but no further BM on commode.  LPN aware.  Poor standing and transfer did not allow for gait progression.   Follow Up Recommendations  SNF     Equipment Recommendations  Other (comment) (TBD at next venue of care)    Recommendations for Other Services       Precautions / Restrictions Precautions Precautions: Fall Restrictions Weight Bearing Restrictions: No    Mobility  Bed Mobility Overal bed mobility: Needs Assistance Bed Mobility: Supine to Sit;Sit to Supine;Rolling Rolling: Min assist   Supine to sit: Min assist Sit to supine: Min assist   General bed mobility comments: does quite well today with bed mobilty left and right side of bed  Transfers Overall transfer level: Needs assistance Equipment used: 2 person hand held assist Transfers:  Stand Pivot Transfers   Stand pivot transfers: Mod assist;+2 physical assistance       General transfer comment: to  from commode with heavy assist and cues.  Ambulation/Gait             General Gait Details: unable   Stairs             Wheelchair Mobility    Modified Rankin (Stroke Patients Only)       Balance   Sitting-balance support: Feet supported Sitting balance-Leahy Scale: Poor Sitting balance - Comments: able to sit today with min a x 1, leans forward but seems to be in an attempt to stand or transition vs true LOB   Standing balance support: Bilateral upper extremity supported Standing balance-Leahy Scale: Poor Standing balance comment: extensive +2 assist and does not stand fully                            Cognition Arousal/Alertness: Awake/alert Behavior During Therapy: Flat affect Overall Cognitive Status: No family/caregiver present to determine baseline cognitive functioning                                 General Comments: able to follow some simple commands      Exercises      General Comments        Pertinent Vitals/Pain Pain Assessment: Faces Faces Pain Scale: Hurts little more Pain Location: rubs abdomen Pain Descriptors / Indicators: Discomfort Pain Intervention(s): Limited activity  within patient's tolerance;Monitored during session;Repositioned    Home Living                      Prior Function            PT Goals (current goals can now be found in the care plan section) Progress towards PT goals: Progressing toward goals    Frequency    Min 2X/week      PT Plan Current plan remains appropriate    Co-evaluation              AM-PAC PT "6 Clicks" Mobility   Outcome Measure  Help needed turning from your back to your side while in a flat bed without using bedrails?: A Little Help needed moving from lying on your back to sitting on the side of a flat bed without using  bedrails?: A Little Help needed moving to and from a bed to a chair (including a wheelchair)?: A Lot Help needed standing up from a chair using your arms (e.g., wheelchair or bedside chair)?: A Lot Help needed to walk in hospital room?: Total Help needed climbing 3-5 steps with a railing? : Total 6 Click Score: 12    End of Session   Activity Tolerance: Patient tolerated treatment well Patient left: in bed;with nursing/sitter in room;with call bell/phone within reach;with bed alarm set Nurse Communication: Mobility status PT Visit Diagnosis: Difficulty in walking, not elsewhere classified (R26.2);Muscle weakness (generalized) (M62.81)     Time: 8887-5797 PT Time Calculation (min) (ACUTE ONLY): 24 min  Charges:  $Therapeutic Activity: 23-37 mins                    Chesley Noon, PTA 01/31/20, 10:12 AM

## 2020-01-31 NOTE — Progress Notes (Signed)
Occupational Therapy Treatment Patient Details Name: Hailey Lopez MRN: [id number] DOB: 09-Feb-1929 Today's Date: 01/31/2020    History of present illness Per MD notes: Pt is an 87 year old female with past medical history of A. fib-on Coumadin, CKD stage IV, hypertension, hyperlipidemia, CVA, Alzheimer's dementia presents to emergency department with vomiting, generalized weakness, one episode of diarrhea and syncope.  MD assessment includes: Severe sepsis in the setting of obstructive uropathy/complicated Ecoli UTI, E. coli/Enterobacterales bacteremia, AKI on CKD stage IV, Elevated troponin likely demand ischemia, jerking movements of unkown etiology, HTN, hyponatremia, thrombocytopenia, Left proximal ureteral calculus with obstruction s/p cystoscopy and left ureteral stent placement, and debility.   OT comments  Ms. Dugger presents with generalized weakness and low endurance; however, she is more active and alert today than in other rehabilitation sessions earlier this week. Despite repeated encouragement and multimodal cueing, pt will not attempt self-feeding, but eats well when someone else brings the food to her mouth. Although generally non-verbal, she  is able to state that she is not having any pain at present and also able to indicate that she would like to move out of the bed. Both sitting and standing balance are poor, but pt able to transfer to recliner with +2 hand-held assist. Pt's caregiver (niece) is present throughout session, and indicates that she would rather take pt home than have her move to a SNF. The niece has also consulted with palliative care and is exploring hospice facility-based care for pt.    Follow Up Recommendations  SNF    Equipment Recommendations       Recommendations for Other Services      Precautions / Restrictions Precautions Precautions: Fall Restrictions Weight Bearing Restrictions: No       Mobility Bed Mobility Overal bed mobility: Needs  Assistance Bed Mobility: Supine to Sit     Supine to sit: Min assist        Transfers   Equipment used: 2 person hand held assist Transfers: Stand Pivot Transfers   Stand pivot transfers: +2 safety/equipment;Mod assist       General transfer comment: transfer from bed to recliner    Balance Overall balance assessment: Needs assistance Sitting-balance support: Feet supported Sitting balance-Leahy Scale: Poor Sitting balance - Comments: Displays impulsiveness, making repeated attempts to stand when given instructions to sit EOB Postural control: Right lateral lean Standing balance support: Bilateral upper extremity supported Standing balance-Leahy Scale: Poor Standing balance comment: +2 assist for standing, poor balance                           ADL either performed or assessed with clinical judgement   ADL Overall ADL's : Needs assistance/impaired Eating/Feeding: Maximal assistance   Grooming: Maximal assistance Grooming Details (indicate cue type and reason): Pt pays sporadic attention to directions                                     Vision Patient Visual Report: No change from baseline     Perception     Praxis      Cognition Arousal/Alertness: Awake/alert   Overall Cognitive Status: History of cognitive impairments - at baseline  Exercises Other Exercises Other Exercises: Feeding, grooming, bed mobility, transfers, standing balance tolerance   Shoulder Instructions       General Comments      Pertinent Vitals/ Pain       Pain Assessment: No/denies pain  Home Living                                          Prior Functioning/Environment              Frequency  Min 1X/week        Progress Toward Goals  OT Goals(current goals can now be found in the care plan section)  Progress towards OT goals: Progressing toward goals  Acute  Rehab OT Goals Patient Stated Goal: to be comfortable OT Goal Formulation: With patient/family Time For Goal Achievement: 02/12/20 Potential to Achieve Goals: Good  Plan Discharge plan remains appropriate;Discharge plan needs to be updated (Potentially D/C to hospice facility, per niece)    Co-evaluation                 AM-PAC OT "6 Clicks" Daily Activity     Outcome Measure   Help from another person eating meals?: A Lot Help from another person taking care of personal grooming?: A Lot Help from another person toileting, which includes using toliet, bedpan, or urinal?: A Lot Help from another person bathing (including washing, rinsing, drying)?: A Lot Help from another person to put on and taking off regular upper body clothing?: A Lot Help from another person to put on and taking off regular lower body clothing?: A Lot 6 Click Score: 12    End of Session    OT Visit Diagnosis: Other abnormalities of gait and mobility (R26.89);Muscle weakness (generalized) (M62.81);Feeding difficulties (R63.3);History of falling (Z91.81);Unsteadiness on feet (R26.81)   Activity Tolerance Patient tolerated treatment well   Patient Left in chair;with family/visitor present;with call bell/phone within reach   Nurse Communication          Time: 3976-7341 OT Time Calculation (min): 25 min  Charges: OT General Charges $OT Visit: 1 Visit OT Treatments $Self Care/Home Management : 23-37 mins  Josiah Lobo, PhD, MS, OTR/L ascom [phone] 01/31/20, 3:14 PM

## 2020-01-31 NOTE — TOC Progression Note (Signed)
Transition of Care [facility]) - Progression Note    Patient Details  Name: Hailey Lopez MRN: [id number] Date of Birth: [birth year]  Transition of Care [facility]) CM/SW [place], LCSW Phone Number: 01/31/2020, 1:27 PM  Clinical Narrative:   CSW spoke to patient's niece yesterday. Discussed SNF placement in [place]. She prefers Insurance underwriter but is agreeable to [facility] if needed. Per RN, palliative consulted for potential hospice.  Expected Discharge Plan: Skilled Nursing Facility Barriers to Discharge: Continued Medical Work up  Expected Discharge Plan and Services Expected Discharge Plan: [place] arrangements for the past 2 months: Single Family Home                                       Social Determinants of Health (SDOH) Interventions    Readmission Risk Interventions Readmission Risk Prevention Plan 01/29/2020  Transportation Screening Complete  PCP or Specialist Appt within 5-7 Days Complete  Home Care Screening Complete  Medication Review (RN CM) Complete

## 2020-01-31 NOTE — Progress Notes (Signed)
Spoke with Elmyra Ricks RN re PIV consult.  No IV meds needed at this time, has IV Ativan- last dose 1-9.  Elmyra Ricks RN states ok to leave IV out to preserve veins for when it is needed.  Pt pulling lines out and no current IV meds needed.

## 2020-01-31 NOTE — Progress Notes (Signed)
PROGRESS NOTE    Hailey Lopez  JJK:[id number] DOB: 1929/08/12 DOA: 01/22/2020 PCP: Marda Stalker, PA-C   Brief Narrative: Taken from prior notes. Patient is 87 year old female with past medical history of A. fib-on Coumadin, CKD stage IV, hypertension, hyperlipidemia, CVA, Alzheimer's dementia presents to emergency department with vomiting, generalized weakness, one episode of diarrhea and syncope.  ED course: Patient afebrile, v BP: 145/108, pulse: 136, EKG shows sinus tachycardia and nonspecific ST-T wave changes.  Sodium 146, bicarb 20, anion gap 16, BUN 44, creatinine: 3.45, lactic acid 8.1, procalcitonin 80.51, WBC 11.7, INR: 2.1.  UA is consistent with UTI.  Chest x-ray negative for acute findings.  CT head is negative.  CT abdomen and pelvis showed mild left hydronephrosis with perinephric stranding due to obstructing 15 mm calculus in the left Vitro pelvic junction.  She met sepsis criteria therefore she was given 2 L of IV fluid bolus and started on cefepime.  Urology was consulted.  She tested negative for COVID-19.  Patient admitted under PCCM.  [facility] took over care from 01/23/2018.  Patient was treated for severe sepsis secondary to obstructive uropathy and complicated E. coli and Proteus UTI and E. coli/Enterobacter bacteremia. S/p ureteral stent placement by urology.  Subjective: Patient was little more alert and cooperative today.  Able to answer couple of questions.  Assessment & Plan:   Active Problems:   Acute renal failure superimposed on chronic kidney disease (HCC)   Sepsis ([place])   Dementia without behavioral disturbance (HCC)   Delirium   Ureterolithiasis  Severe sepsis in the setting of obstructive uropathy/complicated Ecoli & Proteus UTI, E. coli/Enterobacterales bacteremia: -Status post left ureteral stent placement by urology.  POD 5. She was initially managed in ICU and received broad-spectrum antibiotics which were narrow down to cefazolin and  now on Keflex for 6 more days. Clinically improving with improvement in leukocytosis and procalcitonin.  Remained afebrile. She will need 14 days of antibiotics and last day of therapy would be on 02/06/2020. -Palliative consult at the request of niece-plan is to continue current level of care for another 48 hours as patient seems little more responsive today and then decide about hospice.  She can always follow-up with hospice as an outpatient. -Continue with antibiotics.  AKI with CKD stage IV.  Baseline creatinine around 1.9.  Slowly improving, on admission it was 3.87, today it was 2.02, quite close to baseline. Nephrology was consulted and she is not a candidate for long-term dialysis. -Continue to monitor. -Avoid nephrotoxins  Acute metabolic encephalopathy.  Most likely with sepsis.  Per niece she did had some underlying dementia. CT head shows no acute changes.  Advanced senescent  changes and borderline ventricular enlargement. B12, TSH and folate are within normal limit.  Ammonia within normal limit. -Palliative care consult.-Little more responsive today, we will monitor for another 24 to 48 hours before making decision regarding hospice care. -PT is recommending SNF placement. -TOC to look for placement  Elevated troponin: -Troponin 214 trended up to 764-819.  EKG: No acute ST-T wave changes noted.  Likely in the setting of demand ischemia in the setting of sepsis -Continue to monitor.  Patient denies any ACS symptoms.  Jerking movements: -Unknown etiology?.  Improving as per RN & the family.  Reviewed meds with pharmacy.  Vitals remained stable.  Could be secondary to pain/discomfort due to purewick.  Continue oxycodone and Ativan as needed. -EEG: No seizure, diffuse encephalopathy  Hypertension.  Blood pressure within goal. -Continue Isordil.  History  of PE and DVT: Continue Coumadin as per pharmacy. - Monitor PT/INR  Hyperlipidemia: Continue atorvastatin  Alzheimer's  dementia: Continue donepezil  Hypernatremia.  Most likely secondary to poor p.o. intake.  Resolved with IV fluid. -Continue gentle IV fluid with D5 till she started taking p.o. or family decided about comfort measures only.  Thrombocytopenia: Resolved.  No signs of active bleeding.   Objective: Vitals:   01/31/20 0008 01/31/20 0434 01/31/20 0605 01/31/20 0803  BP: (!) 154/87 (!) 169/87  (!) 123/95  Pulse: 72 76  60  Resp: '16 16  18  ' Temp: 98.1 F (36.7 C) 98 F (36.7 C)  98.2 F (36.8 C)  TempSrc:      SpO2: 99% 97%  100%  Weight:   62.7 kg   Height:        Intake/Output Summary (Last 24 hours) at 01/31/2020 0840 Last data filed at 01/31/2020 0002 Gross per 24 hour  Intake 0 ml  Output 800 ml  Net -800 ml   Filed Weights   01/27/20 0613 01/29/20 0144 01/31/20 0605  Weight: 63.2 kg 63 kg 62.7 kg    Examination:  General.  Chronically ill-appearing elderly lady, in no acute distress. Pulmonary.  Lungs clear bilaterally, normal respiratory effort. CV.  Regular rate and rhythm, no JVD, rub or murmur. Abdomen.  Soft, nontender, nondistended, BS positive. CNS.  Alert and oriented x3.  No focal neurologic deficit. Extremities.  No edema, no cyanosis, pulses intact and symmetrical. Psychiatry.  Judgment and insight appears impaired.  DVT prophylaxis: Coumadin Code Status: DNR Family Communication: Niece was updated on phone Disposition Plan:  Status is: Inpatient  Remains inpatient appropriate because:Inpatient level of care appropriate due to severity of illness   Dispo: The patient is from: Home              Anticipated d/c is to: To be determined              Anticipated d/c date is: 1 day              Patient currently is not medically stable to d/c.   Consultants:   PCCM  ID  Nephrology  urology  Procedures:   Left ureteral stent placement  Antimicrobials: Cefazolin  Data Reviewed: I have personally reviewed following labs and imaging  studies  CBC: Recent Labs  Lab 01/25/20 0219 01/26/20 0446 01/27/20 0428 01/28/20 0238 01/30/20 0241  WBC 11.7* 8.1 7.4 6.5 7.2  NEUTROABS 9.5* 5.7  --   --   --   HGB 10.6* 10.4* 10.4* 10.3* 10.8*  HCT 32.0* 31.6* 31.7* 30.9* 32.0*  MCV 92.5 92.9 92.2 91.2 91.2  PLT 120* 121* 148* 192 800   Basic Metabolic Panel: Recent Labs  Lab 01/26/20 0446 01/27/20 0428 01/28/20 0238 01/29/20 0454 01/30/20 0241  NA 149* 148* 146* 147* 143  K 3.7 3.8 3.9 4.0 3.7  CL 118* 118* 113* 113* 108  CO2 '23 23 25 23 27  ' GLUCOSE 141* 120* 112* 104* 112*  BUN 35* 31* 33* 33* 30*  CREATININE 2.53* 2.30* 2.13* 2.03* 2.02*  CALCIUM 9.2 9.1 9.0 9.1 9.0  MG  --  1.8  --   --  1.8   GFR: Estimated Creatinine Clearance: 16.6 mL/min (A) (by C-G formula based on SCr of 2.02 mg/dL (H)). Liver Function Tests: Recent Labs  Lab 01/25/20 0219 01/26/20 0446 01/27/20 0428 01/28/20 0238 01/30/20 0241  AST 44* 40 33 39 40  ALT '19 23 20 ' 19  18  ALKPHOS 61 53 50 50 53  BILITOT 0.6 0.5 0.8 0.6 0.6  PROT 5.7* 5.4* 5.6* 5.9* 6.5  ALBUMIN 2.2* 2.2* 2.2* 2.4* 2.6*   No results for input(s): LIPASE, AMYLASE in the last 168 hours. Recent Labs  Lab 01/29/20 1155  AMMONIA 18   Coagulation Profile: Recent Labs  Lab 01/27/20 0428 01/28/20 0238 01/29/20 0454 01/30/20 0241 01/31/20 0121  INR 2.9* 3.0* 2.3* 1.8* 1.6*   Cardiac Enzymes: No results for input(s): CKTOTAL, CKMB, CKMBINDEX, TROPONINI in the last 168 hours. BNP (last 3 results) No results for input(s): PROBNP in the last 8760 hours. HbA1C: No results for input(s): HGBA1C in the last 72 hours. CBG: Recent Labs  Lab 01/24/20 1115 01/24/20 1640 01/24/20 2002 01/25/20 0813 01/25/20 1205  GLUCAP 110* 104* 68* 94 107*   Lipid Profile: No results for input(s): CHOL, HDL, LDLCALC, TRIG, CHOLHDL, LDLDIRECT in the last 72 hours. Thyroid Function Tests: Recent Labs    01/29/20 0454  TSH 3.821   Anemia Panel: Recent Labs     01/29/20 0454  FOLATE 8.0   Sepsis Labs: Recent Labs  Lab 01/24/20 1546 01/24/20 1547 01/24/20 1858 01/25/20 0219 01/25/20 0757 01/25/20 1137 01/26/20 0446  PROCALCITON  --  109.53  --  70.41  --   --  33.37  LATICACIDVEN 2.7*  --  3.1*  --  1.3 1.7  --     Recent Results (from the past 240 hour(s))  Urine culture     Status: Abnormal   Collection Time: 01/23/20 12:13 AM   Specimen: Urine, Random  Result Value Ref Range Status   Specimen Description   Final    URINE, RANDOM Performed at [facility], [address] [place], [place] [address]    Special Requests   Final    NONE Performed at [facility], [address] [place], [place] [address]    Culture >=100,000 COLONIES/mL ESCHERICHIA COLI (A)  Final   Report Status 01/25/2020 FINAL  Final   Organism ID, Bacteria ESCHERICHIA COLI (A)  Final      Susceptibility   Escherichia coli - MIC*    AMPICILLIN >=32 RESISTANT Resistant     CEFAZOLIN <=4 SENSITIVE Sensitive     CEFEPIME <=0.12 SENSITIVE Sensitive     CEFTRIAXONE <=0.25 SENSITIVE Sensitive     CIPROFLOXACIN <=0.25 SENSITIVE Sensitive     GENTAMICIN <=1 SENSITIVE Sensitive     IMIPENEM <=0.25 SENSITIVE Sensitive     NITROFURANTOIN <=16 SENSITIVE Sensitive     TRIMETH/SULFA >=320 RESISTANT Resistant     AMPICILLIN/SULBACTAM 4 SENSITIVE Sensitive     PIP/TAZO <=4 SENSITIVE Sensitive     * >=100,000 COLONIES/mL ESCHERICHIA COLI  Blood Culture (routine x 2)     Status: Abnormal   Collection Time: 01/23/20 12:13 AM   Specimen: BLOOD  Result Value Ref Range Status   Specimen Description   Final    BLOOD LEFT ANTECUBITAL Performed at [facility], [address] [place], [place] [address]    Special Requests   Final    BOTTLES DRAWN AEROBIC AND ANAEROBIC Blood Culture results may not be optimal due to an inadequate volume of blood received in culture bottles Performed at [facility], [address] [place],  [place] [address]    Culture  Setup Time   Final    GRAM NEGATIVE RODS IN BOTH AEROBIC AND ANAEROBIC BOTTLES CRITICAL VALUE NOTED.  VALUE IS CONSISTENT WITH PREVIOUSLY REPORTED AND CALLED VALUE. Performed  at [facility], [address] [place], [place] [address]    Culture (A)  Final    ESCHERICHIA COLI SUSCEPTIBILITIES PERFORMED ON PREVIOUS CULTURE WITHIN THE LAST 5 DAYS. Performed at [facility], [place] [address], [place], [place] [address]    Report Status 01/25/2020 FINAL  Final  Blood Culture (routine x 2)     Status: Abnormal   Collection Time: 01/23/20 12:13 AM   Specimen: BLOOD  Result Value Ref Range Status   Specimen Description   Final    BLOOD BLOOD RIGHT FOREARM Performed at [facility], [address], [place], [place] [address]    Special Requests   Final    BOTTLES DRAWN AEROBIC AND ANAEROBIC Blood Culture results may not be optimal due to an inadequate volume of blood received in culture bottles Performed at [facility], [address], [place], [place] [address]    Culture  Setup Time   Final    GRAM NEGATIVE RODS IN BOTH AEROBIC AND ANAEROBIC BOTTLES CRITICAL RESULT CALLED TO, READ BACK BY AND VERIFIED WITH: M.SLAUGHTER,PHARMD AT 1012 ON 01/23/20 BY GM Performed at [facility], [place] [address] [place], [place] [address]    Culture ESCHERICHIA COLI (A)  Final   Report Status 01/25/2020 FINAL  Final   Organism ID, Bacteria ESCHERICHIA COLI  Final      Susceptibility   Escherichia coli - MIC*    AMPICILLIN >=32 RESISTANT Resistant     CEFAZOLIN <=4 SENSITIVE Sensitive     CEFEPIME <=0.12 SENSITIVE Sensitive     CEFTAZIDIME <=1 SENSITIVE Sensitive     CEFTRIAXONE <=0.25 SENSITIVE Sensitive     CIPROFLOXACIN <=0.25 SENSITIVE Sensitive     GENTAMICIN <=1 SENSITIVE Sensitive     IMIPENEM <=0.25 SENSITIVE Sensitive     TRIMETH/SULFA >=320 RESISTANT Resistant     AMPICILLIN/SULBACTAM 4 SENSITIVE Sensitive     PIP/TAZO <=4  SENSITIVE Sensitive     * ESCHERICHIA COLI  SARS CORONAVIRUS 2 (TAT 6-24 HRS) Nasopharyngeal Nasopharyngeal Swab     Status: None   Collection Time: 01/23/20 12:13 AM   Specimen: Nasopharyngeal Swab  Result Value Ref Range Status   SARS Coronavirus 2 NEGATIVE NEGATIVE Final    Comment: (NOTE) SARS-CoV-2 target nucleic acids are NOT DETECTED.  The SARS-CoV-2 RNA is generally detectable in upper and lower respiratory specimens during the acute phase of infection. Negative results do not preclude SARS-CoV-2 infection, do not rule out co-infections with other pathogens, and should not be used as the sole basis for treatment or other patient management decisions. Negative results must be combined with clinical observations, patient history, and epidemiological information. The expected result is Negative.  Fact Sheet for Patients: SugarRoll.be  Fact Sheet for Healthcare Providers: https://www.woods-mathews.com/  This test is not yet approved or cleared by the [place] FDA and  has been authorized for detection and/or diagnosis of SARS-CoV-2 by FDA under an Emergency Use Authorization (EUA). This EUA will remain  in effect (meaning this test can be used) for the duration of the COVID-19 declaration under Se ction 564(b)(1) of the Act, 21 U.S.C. section 360bbb-3(b)(1), unless the authorization is terminated or revoked sooner.  Performed at [facility], [place] [address], [place], [place] [address]   Blood Culture ID Panel (Reflexed)     Status: Abnormal   Collection Time: 01/23/20 12:13 AM  Result Value Ref Range Status   Enterococcus faecalis NOT DETECTED NOT DETECTED Final   Enterococcus  Faecium NOT DETECTED NOT DETECTED Final   Listeria monocytogenes NOT DETECTED NOT DETECTED Final   Staphylococcus species NOT DETECTED NOT DETECTED Final   Staphylococcus aureus (BCID) NOT DETECTED NOT DETECTED Final   Staphylococcus epidermidis  NOT DETECTED NOT DETECTED Final   Staphylococcus lugdunensis NOT DETECTED NOT DETECTED Final   Streptococcus species NOT DETECTED NOT DETECTED Final   Streptococcus agalactiae NOT DETECTED NOT DETECTED Final   Streptococcus pneumoniae NOT DETECTED NOT DETECTED Final   Streptococcus pyogenes NOT DETECTED NOT DETECTED Final   A.calcoaceticus-baumannii NOT DETECTED NOT DETECTED Final   Bacteroides fragilis NOT DETECTED NOT DETECTED Final   Enterobacterales DETECTED (A) NOT DETECTED Final    Comment: Enterobacterales represent a large order of gram negative bacteria, not a single organism. CRITICAL RESULT CALLED TO, READ BACK BY AND VERIFIED WITH: M.SLAUGHTER,PHARMD AT 1012 ON 01/23/20 BY GM    Enterobacter cloacae complex NOT DETECTED NOT DETECTED Final   Escherichia coli DETECTED (A) NOT DETECTED Final    Comment: CRITICAL RESULT CALLED TO, READ BACK BY AND VERIFIED WITH: M.SLAUGHTER,PHARMD AT 1012 ON 01/23/20 BY GM    Klebsiella aerogenes NOT DETECTED NOT DETECTED Final   Klebsiella oxytoca NOT DETECTED NOT DETECTED Final   Klebsiella pneumoniae NOT DETECTED NOT DETECTED Final   Proteus species NOT DETECTED NOT DETECTED Final   Salmonella species NOT DETECTED NOT DETECTED Final   Serratia marcescens NOT DETECTED NOT DETECTED Final   Haemophilus influenzae NOT DETECTED NOT DETECTED Final   Neisseria meningitidis NOT DETECTED NOT DETECTED Final   Pseudomonas aeruginosa NOT DETECTED NOT DETECTED Final   Stenotrophomonas maltophilia NOT DETECTED NOT DETECTED Final   Candida albicans NOT DETECTED NOT DETECTED Final   Candida auris NOT DETECTED NOT DETECTED Final   Candida glabrata NOT DETECTED NOT DETECTED Final   Candida krusei NOT DETECTED NOT DETECTED Final   Candida parapsilosis NOT DETECTED NOT DETECTED Final   Candida tropicalis NOT DETECTED NOT DETECTED Final   Cryptococcus neoformans/gattii NOT DETECTED NOT DETECTED Final   CTX-M ESBL NOT DETECTED NOT DETECTED Final   Carbapenem  resistance IMP NOT DETECTED NOT DETECTED Final   Carbapenem resistance KPC NOT DETECTED NOT DETECTED Final   Carbapenem resistance NDM NOT DETECTED NOT DETECTED Final   Carbapenem resist OXA 48 LIKE NOT DETECTED NOT DETECTED Final   Carbapenem resistance VIM NOT DETECTED NOT DETECTED Final    Comment: Performed at [facility], [address], [place], [place] [address]  Urine Culture     Status: Abnormal   Collection Time: 01/23/20  4:04 AM   Specimen: Urine, Random  Result Value Ref Range Status   Specimen Description   Final    URINE, RANDOM Performed at [facility], [address] [place], [place] [address]    Special Requests   Final    NONE Performed at [facility], [address] [place], [place] [address]    Culture (A)  Final    50,000 COLONIES/mL PROTEUS MIRABILIS >=100,000 COLONIES/mL ESCHERICHIA COLI    Report Status 01/27/2020 FINAL  Final   Organism ID, Bacteria PROTEUS MIRABILIS (A)  Final   Organism ID, Bacteria ESCHERICHIA COLI (A)  Final      Susceptibility   Escherichia coli - MIC*    AMPICILLIN >=32 RESISTANT Resistant     CEFAZOLIN <=4 SENSITIVE Sensitive     CEFEPIME <=0.12 SENSITIVE Sensitive     CEFTRIAXONE <=0.25 SENSITIVE Sensitive     CIPROFLOXACIN <=0.25 SENSITIVE Sensitive     GENTAMICIN <=  1 SENSITIVE Sensitive     IMIPENEM <=0.25 SENSITIVE Sensitive     NITROFURANTOIN <=16 SENSITIVE Sensitive     TRIMETH/SULFA >=320 RESISTANT Resistant     AMPICILLIN/SULBACTAM 4 SENSITIVE Sensitive     PIP/TAZO <=4 SENSITIVE Sensitive     * >=100,000 COLONIES/mL ESCHERICHIA COLI   Proteus mirabilis - MIC*    AMPICILLIN <=2 SENSITIVE Sensitive     CEFAZOLIN <=4 SENSITIVE Sensitive     CEFEPIME <=0.12 SENSITIVE Sensitive     CEFTRIAXONE <=0.25 SENSITIVE Sensitive     CIPROFLOXACIN <=0.25 SENSITIVE Sensitive     GENTAMICIN <=1 SENSITIVE Sensitive     IMIPENEM 8 INTERMEDIATE Intermediate     NITROFURANTOIN RESISTANT Resistant      TRIMETH/SULFA <=20 SENSITIVE Sensitive     AMPICILLIN/SULBACTAM <=2 SENSITIVE Sensitive     PIP/TAZO <=4 SENSITIVE Sensitive     * 50,000 COLONIES/mL PROTEUS MIRABILIS  MRSA PCR Screening     Status: None   Collection Time: 01/23/20  5:23 AM   Specimen: Nasopharyngeal  Result Value Ref Range Status   MRSA by PCR NEGATIVE NEGATIVE Final    Comment:        The GeneXpert MRSA Assay (FDA approved for NASAL specimens only), is one component of a comprehensive MRSA colonization surveillance program. It is not intended to diagnose MRSA infection nor to guide or monitor treatment for MRSA infections. Performed at [facility], [address] [place], [place] [address]      Radiology Studies: No results found.  Scheduled Meds: . atorvastatin  10 mg Oral Daily  . calcitRIOL  0.5 mcg Oral QHS  . cephALEXin  500 mg Oral Q8H  . Chlorhexidine Gluconate Cloth  6 each Topical Q0600  . donepezil  5 mg Oral QHS  . feeding supplement  237 mL Oral TID BM  . isosorbide dinitrate  5 mg Oral Daily  . mirtazapine  15 mg Oral QHS  . multivitamin with minerals  1 tablet Oral Daily  . Warfarin - Pharmacist Dosing Inpatient   Does not apply q1600   Continuous Infusions: . sodium chloride 250 mL (01/23/20 1338)  . dextrose       LOS: 8 days   Time spent: 25 minutes  Lorella Nimrod, MD [facility]  If 7PM-7AM, please contact night-coverage [email or web address]  01/31/2020, 8:40 AM   This record has been created using Systems analyst. Errors have been sought and corrected,but may not always be located. Such creation errors do not reflect on the standard of care.

## 2020-01-31 NOTE — Consult Note (Signed)
Consultation Note Date: 01/31/2020   Patient Name: Hailey Lopez  DOB: 23-Sep-1929  MRN: [id number]  Age / Sex: 87 y.o., female  PCP: Hailey Stalker, PA-C Referring Physician: Lorella Nimrod, MD  Reason for Consultation: Establishing goals of care  HPI/Patient Profile: 87 y.o. female  with past medical history of Alzheimer's dementia, pacemaker, HTN, HLD, DVT, CVA, afib on Coumadin, CKD stage IV admitted on 01/22/2020 with diarrhea and syncope. Found to have severe sepsis secondary to obstructive uropathy and complicated E. Coli/Proteus UTI and E.coli/enterobacter bacteremia. Taken urgently for cystoscopy s/p stent placement. Patient requires 14 days of antibiotics, last day 02/06/20. Baseline Alzheimer's with poor cognitive, functional, and nutritional status this admission. Patient refusing oral intake x2 days and withdrawn. CT head negative for acute changes. Palliative medicine consultation for goals of care.   Clinical Assessment and Goals of Care:  I have reviewed medical records, discussed with care team, and met with niece Hailey Lopez) outside of room to discuss goals of care. Niece reports [name] is much more awake, alert, and communicative today compared to the last two days. Baseline, she is pleasantly confused and disoriented to place, time, year, situation. Nursing staff will attempt to feed her lunch this afternoon. Patient does not show signs of pain or distress.    Introduced Palliative Medicine as specialized medical care for people living with serious illness. It focuses on providing relief from the symptoms and stress of a serious illness. The goal is to improve quality of life for both the patient and the family.  We discussed a brief life review of the patient. Widowed. One son who is deceased. Two step-sons but living in [place]. Three years ago, patient's step-son brought her down to stay with  patient's sister/niece for 3 months and he never came back to get her. Patient has been living with niece, Hailey Lopez for ~3 years now.   Prior to admission, patient was ambulating independently. She requires assist with ADL's and encouragement with meals. Cognitively, she is disoriented and recognizes Hailey Lopez as a safe person but does not know her name or that she is family. Patient was diagnosed with Alzheimer's in 2014.   Discussed events leading up to admission and course of hospitalization including diagnoses, interventions, plan of care. Discussed expectations and trajectory of progressive dementia. Hailey Lopez has a good understanding of her Aunt's condition and she will likely be at a new baseline following acute infections and hospitalization.   I attempted to elicit values and goals of care important to the niece. Advanced directives, concepts specific to code status, artifical feeding and hydration, and rehospitalization were considered and discussed. Hailey Lopez confirms DNR/DNI code status. She would not wish for feeding tube placement, understanding this would not be recommended with the irreversible nature of her aunt's condition. Hailey Lopez shares that prior to her dementia, [name] was very proper, would always dress to the nines. If [name] was oriented, this current quality of life would not be acceptable. She wishes for [name] to have "peace" as her condition  declines and die peacefully when the time comes. She does not wish to prolong her life, especially if worsening quality of life.  Introduced MOST form. Hailey Lopez will review and we may complete tomorrow if she is ready.   Introduced outpatient palliative versus hospice options. Hailey Lopez and I agreed that she likely will not tolerate or benefit from extensive physical therapy. Discussed hospice philosophy and comfort focused care plan. Hailey Lopez is considering hospice options on discharge. She does mention her fear of [name] dying in her home;  Hailey Lopez prefers she discharge to a nursing home (or hospice home if eligible) where she would plan to visit her daily.      Hailey Lopez and I discussed plan for watchful waiting today since [name] is more awake, alert, and communicating with niece and nursing staff. Monitor nutritional intake and assist/encourage meals.   Questions and concerns were addressed.  Hard Choices booklet and PMT contact information given. Hailey Lopez and I plan to meet again tomorrow 1/14 at 12pm.     Johnson with niece/primary caregiver, Hailey Lopez.  Niece confirms DNR/DNI code status. NO feeding tube.   Patient is more awake, alert, and communicating today. Continue current plan of care and medical management. Assist and encouragement with meals. Continue dysphagia diet. Comfort feeds per patient/family request.   Niece considering outpatient palliative vs. Hospice options on discharge.   Will see how patient does today. PMT provider to f/u with niece tomorrow 1/14 at 12pm.   Code Status/Advance Care Planning:  DNR  Symptom Management:   Per attending  Palliative Prophylaxis:   Aspiration, Delirium Protocol, Frequent Pain Assessment, Oral Care and Turn Reposition  Psycho-social/Spiritual:   Desire for further Chaplaincy support: yes  Additional Recommendations: Caregiving  Support/Resources, Compassionate Wean Education and Education on Hospice  Prognosis:   Poor long-term with progressive Alzheimer's dementia, declining functional/cognitive/nutritional status  Discharge Planning: To Be Determined      Primary Diagnoses: Present on Admission: **None**   I have reviewed the medical record, interviewed the patient and family, and examined the patient. The following aspects are pertinent.  Past Medical History:  Diagnosis Date  . Alzheimer's disease ([place])   . Atrial fibrillation ([place])   . Chronic kidney disease    Stage 4  . CVA (cerebrovascular accident) ([place])   .  DVT (deep venous thrombosis) ([place]) 2013   LLE  . Hyperlipidemia   . Hypertension   . Pacemaker   . Pulmonary embolism [facility])    Social History   Socioeconomic History  . Marital status: Widowed    Spouse name: Not on file  . Number of children: 1  . Years of education: Not on file  . Highest education level: Associate degree: academic program  Occupational History    Comment: retired  Tobacco Use  . Smoking status: Former Research scientist (life sciences)  . Smokeless tobacco: Never Used  Substance and Sexual Activity  . Alcohol use: Not Currently  . Drug use: Never  . Sexual activity: Not on file  Other Topics Concern  . Not on file  Social History Narrative   10/24/18 lives with Ship broker, Hailey Lopez, from [place], husband passed in 208, only child (son) passed 80   Social Determinants of Health   Financial Resource Strain: Not on file  Food Insecurity: Not on file  Transportation Needs: Not on file  Physical Activity: Not on file  Stress: Not on file  Social Connections: Not on file   Family History  Problem Relation Age  of Onset  . Dementia Mother   . Heart attack Father   . Stroke Father   . Hypertension Father   . Heart attack Sister   . Heart attack Brother   . Heart attack Brother   . Stroke Brother   . Hypertension Brother   . Diabetes Brother   . Hyperlipidemia Sister   . Dementia Sister    Scheduled Meds: . atorvastatin  10 mg Oral Daily  . calcitRIOL  0.5 mcg Oral QHS  . cephALEXin  500 mg Oral Q8H  . Chlorhexidine Gluconate Cloth  6 each Topical Q0600  . donepezil  5 mg Oral QHS  . feeding supplement  237 mL Oral TID BM  . isosorbide dinitrate  5 mg Oral Daily  . mirtazapine  15 mg Oral QHS  . multivitamin with minerals  1 tablet Oral Daily  . warfarin  3 mg Oral ONCE-1600  . Warfarin - Pharmacist Dosing Inpatient   Does not apply q1600   Continuous Infusions: . sodium chloride 250 mL (01/23/20 1338)  . dextrose     PRN Meds:.sodium chloride, acetaminophen,  docusate sodium, hydrALAZINE, LORazepam, ondansetron (ZOFRAN) IV, oxyCODONE, polyethylene glycol, traZODone Medications Prior to Admission:  Prior to Admission medications   Medication Sig Start Date End Date Taking? Authorizing Provider  atorvastatin (LIPITOR) 10 MG tablet Take 10 mg by mouth daily.   Yes [provider]  calcitRIOL (ROCALTROL) 0.5 MCG capsule Take 0.5 mcg by mouth at bedtime.    Yes [provider]  donepezil (ARICEPT) 5 MG tablet Take 5 mg by mouth at bedtime. 08/08/19  Yes [provider]  isosorbide dinitrate (ISORDIL) 5 MG tablet Take 5 mg by mouth daily.    Yes [provider]  polyethylene glycol (MIRALAX / GLYCOLAX) packet Take 17 g by mouth daily as needed for mild constipation or moderate constipation.    Yes [provider]  warfarin (COUMADIN) 3 MG tablet Take 1 tablet by mouth once daily 01/17/20  Yes Skeet Latch, MD   No Known Allergies Review of Systems  Unable to perform ROS: Dementia   Physical Exam Vitals and nursing note reviewed.  Constitutional:      General: She is awake.     Appearance: She is cachectic. She is ill-appearing.  HENT:     Head: Normocephalic and atraumatic.  Cardiovascular:     Heart sounds: Normal heart sounds.  Pulmonary:     Effort: No tachypnea, accessory muscle usage or respiratory distress.     Breath sounds: Normal breath sounds.  Skin:    General: Skin is warm and dry.  Neurological:     Mental Status: She is alert.     Comments: Awake, alert, talking, follows simple commands. Pleasant confusion with baseline dementia.  Psychiatric:        Attention and Perception: She is inattentive.        Cognition and Memory: Cognition is impaired.    Vital Signs: BP 118/71 (BP Location: Right Arm)   Pulse 72   Temp 97.7 F (36.5 C)   Resp 16   Ht '5\' 4"'  (1.626 m)   Wt 62.7 kg   SpO2 96%   BMI 23.73 kg/m  Pain Scale: 0-10 POSS *See Group Information*: 1-Acceptable,Awake  and alert Pain Score: 0-No pain   SpO2: SpO2: 96 % O2 Device:SpO2: 96 % O2 Flow Rate: .O2 Flow Rate (L/min): 2 L/min  IO: Intake/output summary:   Intake/Output Summary (Last 24 hours) at 01/31/2020 1508 Last  data filed at 01/31/2020 1436 Gross per 24 hour  Intake 0 ml  Output 800 ml  Net -800 ml    LBM: Last BM Date: 01/28/20 Baseline Weight: Weight: 63.5 kg Most recent weight: Weight: 62.7 kg     Palliative Assessment/Data: PPS 30%   Flowsheet Rows   Flowsheet Row Most Recent Value  Intake Tab   Referral Department Hospitalist  Unit at Time of Referral Med/Surg Unit  Palliative Care Primary Diagnosis Neurology  Palliative Care Type New Palliative care  Reason for referral Clarify Goals of Care  Date first seen by Palliative Care 01/31/20  Clinical Assessment   Palliative Performance Scale Score 30%  Psychosocial & Spiritual Assessment   Palliative Care Outcomes   Patient/Family meeting held? Yes  Who was at the meeting? niece  Palliative Care Outcomes Clarified goals of care, Counseled regarding hospice, Provided end of life care assistance, Provided psychosocial or spiritual support, ACP counseling assistance      Time Total: 74 Greater than 50%  of this time was spent counseling and coordinating care related to the above assessment and plan.  Signed by:  Ihor Dow, DNP, FNP-C Palliative Medicine Team  Phone: [phone] Fax: [phone]   Please contact Palliative Medicine Team phone at [phone] for questions and concerns.  For individual provider: See Shea Evans

## 2020-01-31 NOTE — Progress Notes (Signed)
[place] for warfarin Indication: atrial fibrillation   Labs: Recent Labs    01/29/20 0454 01/30/20 0241 01/31/20 0121  HGB  --  10.8*  --   HCT  --  32.0*  --   PLT  --  316  --   LABPROT 24.6* 20.5* 18.4*  INR 2.3* 1.8* 1.6*  CREATININE 2.03* 2.02*  --     Estimated Creatinine Clearance: 16.6 mL/min (A) (by C-G formula based on SCr of 2.02 mg/dL (H)).   Medical History: Past Medical History:  Diagnosis Date  . Alzheimer's disease ([place])   . Atrial fibrillation ([place])   . Chronic kidney disease    Stage 4  . CVA (cerebrovascular accident) ([place])   . DVT (deep venous thrombosis) ([place]) 2013   LLE  . Hyperlipidemia   . Hypertension   . Pacemaker   . Pulmonary embolism [facility])     Assessment: 87 year old female presented with vomiting and diarrhea. Found to have left proximal ureteral calculus with obstruction, now s/p left ureteral stent placement. Blood cultures with 4/4 bottles E.coli, likely urinary source. Patient with h/o afib on warfarin PTA. Last known home dose warfarin 3 mg daily. INR therapeutic on admission.  Drug-drug interactions: ancef, cephalexin  Albumin: 3.3 >> 2.4 >> 2.2 >> 2.4  Date INR Dose 1/4 2.1 3 mg (patient reported PTA) 1/5 -- Held 1/6 3.0 Held 1/7 2.6 3 mg 1/8 2.6 3 mg 1/9 2.9 2 mg 1/10     3.0 DOSE HELD 1/11 2.3       2 mg 1/12     1.8       2.5 mg 1/13     1.6  Goal of Therapy:  INR 2-3 Monitor platelets by anticoagulation protocol: Yes   Plan:  --INR is subtherapeutic today. Will order warfarin 3 mg x 1 dose today. CBC stable  --Continue to monitor INR daily per protocol and CBC at least every 3 days.   Paulina Fusi, PharmD, BCPS 01/31/2020 9:42 AM

## 2020-01-31 NOTE — Progress Notes (Signed)
Pt pulled out IV. No bleeding or complications seen. IV team consulted to start new IV.

## 2020-02-01 DIAGNOSIS — N390 Urinary tract infection, site not specified: Secondary | ICD-10-CM | POA: Diagnosis not present

## 2020-02-01 DIAGNOSIS — N201 Calculus of ureter: Secondary | ICD-10-CM | POA: Diagnosis not present

## 2020-02-01 DIAGNOSIS — A419 Sepsis, unspecified organism: Secondary | ICD-10-CM | POA: Diagnosis not present

## 2020-02-01 DIAGNOSIS — N179 Acute kidney failure, unspecified: Secondary | ICD-10-CM | POA: Diagnosis not present

## 2020-02-01 DIAGNOSIS — F0390 Unspecified dementia without behavioral disturbance: Secondary | ICD-10-CM | POA: Diagnosis not present

## 2020-02-01 LAB — PROTIME-INR
INR: 1.4 — ABNORMAL HIGH (ref 0.8–1.2)
Prothrombin Time: 16.5 seconds — ABNORMAL HIGH (ref 11.4–15.2)

## 2020-02-01 MED ORDER — WARFARIN SODIUM 4 MG PO TABS
4.0000 mg | ORAL_TABLET | Freq: Once | ORAL | Status: AC
  Administered 2020-02-01: 4 mg via ORAL
  Filled 2020-02-01: qty 1

## 2020-02-01 MED ORDER — DIPHENOXYLATE-ATROPINE 2.5-0.025 MG PO TABS: 1.0000 | ORAL_TABLET | Freq: Two times a day (BID) | ORAL | Status: DC | PRN

## 2020-02-01 NOTE — Progress Notes (Signed)
[place] for warfarin Indication: atrial fibrillation   Labs: Recent Labs    01/30/20 0241 01/31/20 0121 02/01/20 0111  HGB 10.8*  --   --   HCT 32.0*  --   --   PLT 316  --   --   LABPROT 20.5* 18.4* 16.5*  INR 1.8* 1.6* 1.4*  CREATININE 2.02*  --   --     Estimated Creatinine Clearance: 16.6 mL/min (A) (by C-G formula based on SCr of 2.02 mg/dL (H)).   Medical History: Past Medical History:  Diagnosis Date  . Alzheimer's disease ([place])   . Atrial fibrillation ([place])   . Chronic kidney disease    Stage 4  . CVA (cerebrovascular accident) ([place])   . DVT (deep venous thrombosis) ([place]) 2013   LLE  . Hyperlipidemia   . Hypertension   . Pacemaker   . Pulmonary embolism [facility])     Assessment: 87 year old female presented with vomiting and diarrhea. Found to have left proximal ureteral calculus with obstruction, now s/p left ureteral stent placement. Blood cultures with 4/4 bottles E.coli, likely urinary source. Patient with h/o afib on warfarin PTA. Last known home dose warfarin 3 mg daily. INR therapeutic on admission.  Drug-drug interactions: ancef, cephalexin  Albumin: 3.3 >> 2.4 >> 2.2 >> 2.4  Date INR Dose 1/4 2.1 3 mg (patient reported PTA) 1/5 -- Held 1/6 3.0 Held 1/7 2.6 3 mg 1/8 2.6 3 mg 1/9 2.9 2 mg 1/10     3.0 DOSE HELD 1/11 2.3       2 mg 1/12     1.8       2.5 mg 1/13     1.6 3mg  1/14 1.4  Goal of Therapy:  INR 2-3 Monitor platelets by anticoagulation protocol: Yes   Plan:  --INR is subtherapeutic today. Will order warfarin 4mg  x 1 dose today. CBC stable  --Continue to monitor INR daily per protocol and CBC at least every 3 days.   Pernell Dupre, PharmD, BCPS Clinical Pharmacist 02/01/2020 8:58 AM

## 2020-02-01 NOTE — Plan of Care (Signed)

## 2020-02-01 NOTE — Plan of Care (Signed)

## 2020-02-01 NOTE — TOC Progression Note (Addendum)
Transition of Care [facility]) - Progression Note    Patient Details  Name: Hailey Lopez MRN: [id number] Date of Birth: Mar 05, 1929  Transition of Care [facility]) CM/SW [place], LCSW Phone Number: 02/01/2020, 2:13 PM  Clinical Narrative:   CSW notified plan for SNF with OP Palliative after discussion today.   Spoke to niece [name], presented bed offers. [name] wants to research options and will let CSW know by end of day so insurance authorization can be started.  3:20- [name] chose [facility]. CSW left voicemail for [facility] in Admissions at [facility] to accept bed offer, requested a return call. Also accepted bed offer in Epic. Started insurance authorization on Navi portal. Updated [name] and left handoff for weekend TOC to follow up.  Expected Discharge Plan: Skilled Nursing Facility Barriers to Discharge: Continued Medical Work up  Expected Discharge Plan and Services Expected Discharge Plan: [place] arrangements for the past 2 months: Single Family Home                                       Social Determinants of Health (SDOH) Interventions    Readmission Risk Interventions Readmission Risk Prevention Plan 01/29/2020  Transportation Screening Complete  PCP or Specialist Appt within 5-7 Days Complete  Home Care Screening Complete  Medication Review (RN CM) Complete

## 2020-02-01 NOTE — Progress Notes (Signed)
PROGRESS NOTE    Hailey Lopez  NAT:[id number] DOB: [birth year] DOA: 01/22/2020 PCP: Marda Stalker, PA-C   Brief Narrative: Taken from prior notes. Patient is 87 year old female with past medical history of A. fib-on Coumadin, CKD stage IV, hypertension, hyperlipidemia, CVA, Alzheimer's dementia presents to emergency department with vomiting, generalized weakness, one episode of diarrhea and syncope.  ED course: Patient afebrile, v BP: 145/108, pulse: 136, EKG shows sinus tachycardia and nonspecific ST-T wave changes.  Sodium 146, bicarb 20, anion gap 16, BUN 44, creatinine: 3.45, lactic acid 8.1, procalcitonin 80.51, WBC 11.7, INR: 2.1.  UA is consistent with UTI.  Chest x-ray negative for acute findings.  CT head is negative.  CT abdomen and pelvis showed mild left hydronephrosis with perinephric stranding due to obstructing 15 mm calculus in the left Vitro pelvic junction.  She met sepsis criteria therefore she was given 2 L of IV fluid bolus and started on cefepime.  Urology was consulted.  She tested negative for COVID-19.  Patient admitted under PCCM.  [facility] took over care from 01/23/2018.  Patient was treated for severe sepsis secondary to obstructive uropathy and complicated E. coli and Proteus UTI and E. coli/Enterobacter bacteremia. S/p ureteral stent placement by urology.  Subjective: Patient was resting comfortably when seen today.  Niece at bedside.  Started talking and taking some Ensure and boost.  Assessment & Plan:   Active Problems:   Acute renal failure superimposed on chronic kidney disease ([place])   Septic shock (HCC)   Dementia without behavioral disturbance ([place])   Delirium   Ureterolithiasis   Palliative care by specialist   Goals of care, counseling/discussion  Severe sepsis in the setting of obstructive uropathy/complicated Ecoli & Proteus UTI, E. coli/Enterobacterales bacteremia: -Status post left ureteral stent placement by urology.  POD 5. She  was initially managed in ICU and received broad-spectrum antibiotics which were narrow down to cefazolin and now on Keflex for 6 more days. Clinically improving with improvement in leukocytosis and procalcitonin.  Remained afebrile. She will need 14 days of antibiotics and last day of therapy would be on 02/06/2020. -Palliative consult at the request of niece-final plan is to discharge to rehab and outpatient follow-up with palliative care and hospice. -Continue with antibiotics.  AKI with CKD stage IV.  Baseline creatinine around 1.9.  Slowly improving, on admission it was 3.87, today it was 2.02, quite close to baseline. Nephrology was consulted and she is not a candidate for long-term dialysis. -Continue to monitor. -Avoid nephrotoxins  Acute metabolic encephalopathy.  Most likely with sepsis.  Per niece she did had some underlying dementia. CT head shows no acute changes.  Advanced senescent  changes and borderline ventricular enlargement. B12, TSH and folate are within normal limit.  Ammonia within normal limit. -Palliative care consult.-Little more responsive today, we will monitor for another 24 to 48 hours before making decision regarding hospice care. -PT is recommending SNF placement. -TOC to look for placement  Elevated troponin: -Troponin 214 trended up to 764-819.  EKG: No acute ST-T wave changes noted.  Likely in the setting of demand ischemia in the setting of sepsis -Continue to monitor.  Patient denies any ACS symptoms.  Jerking movements: -Unknown etiology?.  Improving as per RN & the family.  Reviewed meds with pharmacy.  Vitals remained stable.  Could be secondary to pain/discomfort due to purewick.  Continue oxycodone and Ativan as needed. -EEG: No seizure, diffuse encephalopathy  Hypertension.  Blood pressure within goal. -Continue Isordil.  History of  PE and DVT: Continue Coumadin as per pharmacy. - Monitor PT/INR  Hyperlipidemia: Continue  atorvastatin  Alzheimer's dementia: Continue donepezil  Hypernatremia.  Most likely secondary to poor p.o. intake.  Resolved with IV fluid. -Continue gentle IV fluid with D5 till she started taking p.o. or family decided about comfort measures only.  Thrombocytopenia: Resolved.  No signs of active bleeding.   Objective: Vitals:   02/01/20 0434 02/01/20 0500 02/01/20 0804 02/01/20 1143  BP: 130/75  (!) 149/73 131/81  Pulse: 86  68 75  Resp: '15  18 16  ' Temp: 98.2 F (36.8 C)  98.9 F (37.2 C) 98.3 F (36.8 C)  TempSrc: Axillary   Oral  SpO2: 98%  100% 100%  Weight:  62.1 kg    Height:        Intake/Output Summary (Last 24 hours) at 02/01/2020 1603 Last data filed at 02/01/2020 0040 Gross per 24 hour  Intake 240 ml  Output 400 ml  Net -160 ml   Filed Weights   01/31/20 0605 02/01/20 0023 02/01/20 0500  Weight: 62.7 kg 62.1 kg 62.1 kg    Examination:  General.  Frail elderly lady, in no acute distress. Pulmonary.  Lungs clear bilaterally, normal respiratory effort. CV.  Regular rate and rhythm, no JVD, rub or murmur. Abdomen.  Soft, nontender, nondistended, BS positive. CNS.  Alert and oriented x3.  No focal neurologic deficit. Extremities.  No edema, no cyanosis, pulses intact and symmetrical. Psychiatry.  Judgment and insight appears impaired.  DVT prophylaxis: Coumadin Code Status: DNR Family Communication: Niece was updated at bedside Disposition Plan:  Status is: Inpatient  Remains inpatient appropriate because:Inpatient level of care appropriate due to severity of illness   Dispo: The patient is from: Home              Anticipated d/c is to: To be determined              Anticipated d/c date is: 1 day              Patient currently is not medically stable to d/c.   Consultants:   PCCM  ID  Nephrology  urology  Procedures:   Left ureteral stent placement  Antimicrobials: Cefazolin  Data Reviewed: I have personally reviewed following labs  and imaging studies  CBC: Recent Labs  Lab 01/26/20 0446 01/27/20 0428 01/28/20 0238 01/30/20 0241  WBC 8.1 7.4 6.5 7.2  NEUTROABS 5.7  --   --   --   HGB 10.4* 10.4* 10.3* 10.8*  HCT 31.6* 31.7* 30.9* 32.0*  MCV 92.9 92.2 91.2 91.2  PLT 121* 148* 192 093   Basic Metabolic Panel: Recent Labs  Lab 01/26/20 0446 01/27/20 0428 01/28/20 0238 01/29/20 0454 01/30/20 0241  NA 149* 148* 146* 147* 143  K 3.7 3.8 3.9 4.0 3.7  CL 118* 118* 113* 113* 108  CO2 '23 23 25 23 27  ' GLUCOSE 141* 120* 112* 104* 112*  BUN 35* 31* 33* 33* 30*  CREATININE 2.53* 2.30* 2.13* 2.03* 2.02*  CALCIUM 9.2 9.1 9.0 9.1 9.0  MG  --  1.8  --   --  1.8   GFR: Estimated Creatinine Clearance: 16.6 mL/min (A) (by C-G formula based on SCr of 2.02 mg/dL (H)). Liver Function Tests: Recent Labs  Lab 01/26/20 0446 01/27/20 0428 01/28/20 0238 01/30/20 0241  AST 40 33 39 40  ALT '23 20 19 18  ' ALKPHOS 53 50 50 53  BILITOT 0.5 0.8 0.6 0.6  PROT 5.4*  5.6* 5.9* 6.5  ALBUMIN 2.2* 2.2* 2.4* 2.6*   No results for input(s): LIPASE, AMYLASE in the last 168 hours. Recent Labs  Lab 01/29/20 1155  AMMONIA 18   Coagulation Profile: Recent Labs  Lab 01/28/20 0238 01/29/20 0454 01/30/20 0241 01/31/20 0121 02/01/20 0111  INR 3.0* 2.3* 1.8* 1.6* 1.4*   Cardiac Enzymes: No results for input(s): CKTOTAL, CKMB, CKMBINDEX, TROPONINI in the last 168 hours. BNP (last 3 results) No results for input(s): PROBNP in the last 8760 hours. HbA1C: No results for input(s): HGBA1C in the last 72 hours. CBG: No results for input(s): GLUCAP in the last 168 hours. Lipid Profile: No results for input(s): CHOL, HDL, LDLCALC, TRIG, CHOLHDL, LDLDIRECT in the last 72 hours. Thyroid Function Tests: No results for input(s): TSH, T4TOTAL, FREET4, T3FREE, THYROIDAB in the last 72 hours. Anemia Panel: No results for input(s): VITAMINB12, FOLATE, FERRITIN, TIBC, IRON, RETICCTPCT in the last 72 hours. Sepsis Labs: Recent Labs   Lab 01/26/20 0446  PROCALCITON 33.37    Recent Results (from the past 240 hour(s))  Urine culture     Status: Abnormal   Collection Time: 01/23/20 12:13 AM   Specimen: Urine, Random  Result Value Ref Range Status   Specimen Description   Final    URINE, RANDOM Performed at [facility], [address] [place], [place] [address]    Special Requests   Final    NONE Performed at [facility], [address] [place], [place] [address]    Culture >=100,000 COLONIES/mL ESCHERICHIA COLI (A)  Final   Report Status 01/25/2020 FINAL  Final   Organism ID, Bacteria ESCHERICHIA COLI (A)  Final      Susceptibility   Escherichia coli - MIC*    AMPICILLIN >=32 RESISTANT Resistant     CEFAZOLIN <=4 SENSITIVE Sensitive     CEFEPIME <=0.12 SENSITIVE Sensitive     CEFTRIAXONE <=0.25 SENSITIVE Sensitive     CIPROFLOXACIN <=0.25 SENSITIVE Sensitive     GENTAMICIN <=1 SENSITIVE Sensitive     IMIPENEM <=0.25 SENSITIVE Sensitive     NITROFURANTOIN <=16 SENSITIVE Sensitive     TRIMETH/SULFA >=320 RESISTANT Resistant     AMPICILLIN/SULBACTAM 4 SENSITIVE Sensitive     PIP/TAZO <=4 SENSITIVE Sensitive     * >=100,000 COLONIES/mL ESCHERICHIA COLI  Blood Culture (routine x 2)     Status: Abnormal   Collection Time: 01/23/20 12:13 AM   Specimen: BLOOD  Result Value Ref Range Status   Specimen Description   Final    BLOOD LEFT ANTECUBITAL Performed at [facility], [address], [place], [place] [address]    Special Requests   Final    BOTTLES DRAWN AEROBIC AND ANAEROBIC Blood Culture results may not be optimal due to an inadequate volume of blood received in culture bottles Performed at [facility], [address] [place], [place] [address]    Culture  Setup Time   Final    GRAM NEGATIVE RODS IN BOTH AEROBIC AND ANAEROBIC BOTTLES CRITICAL VALUE NOTED.  VALUE IS CONSISTENT WITH PREVIOUSLY REPORTED AND CALLED VALUE. Performed at [facility],  [address] [place], [place] [address]    Culture (A)  Final    ESCHERICHIA COLI SUSCEPTIBILITIES PERFORMED ON PREVIOUS CULTURE WITHIN THE LAST 5 DAYS. Performed at [facility], [place] [address], [place], [place] [address]    Report Status 01/25/2020 FINAL  Final  Blood Culture (routine x 2)     Status: Abnormal  Collection Time: 01/23/20 12:13 AM   Specimen: BLOOD  Result Value Ref Range Status   Specimen Description   Final    BLOOD BLOOD RIGHT FOREARM Performed at [facility], [address] [place], [place] [address]    Special Requests   Final    BOTTLES DRAWN AEROBIC AND ANAEROBIC Blood Culture results may not be optimal due to an inadequate volume of blood received in culture bottles Performed at [facility], [address], [place], [place] [address]    Culture  Setup Time   Final    GRAM NEGATIVE RODS IN BOTH AEROBIC AND ANAEROBIC BOTTLES CRITICAL RESULT CALLED TO, READ BACK BY AND VERIFIED WITH: M.SLAUGHTER,PHARMD AT 1012 ON 01/23/20 BY GM Performed at [facility], [place] [address] [place] [address] [place], [place] [address]    Culture ESCHERICHIA COLI (A)  Final   Report Status 01/25/2020 FINAL  Final   Organism ID, Bacteria ESCHERICHIA COLI  Final      Susceptibility   Escherichia coli - MIC*    AMPICILLIN >=32 RESISTANT Resistant     CEFAZOLIN <=4 SENSITIVE Sensitive     CEFEPIME <=0.12 SENSITIVE Sensitive     CEFTAZIDIME <=1 SENSITIVE Sensitive     CEFTRIAXONE <=0.25 SENSITIVE Sensitive     CIPROFLOXACIN <=0.25 SENSITIVE Sensitive     GENTAMICIN <=1 SENSITIVE Sensitive     IMIPENEM <=0.25 SENSITIVE Sensitive     TRIMETH/SULFA >=320 RESISTANT Resistant     AMPICILLIN/SULBACTAM 4 SENSITIVE Sensitive     PIP/TAZO <=4 SENSITIVE Sensitive     * ESCHERICHIA COLI  SARS CORONAVIRUS 2 (TAT 6-24 HRS) Nasopharyngeal Nasopharyngeal Swab     Status: None   Collection Time: 01/23/20 12:13 AM   Specimen: Nasopharyngeal Swab  Result Value Ref Range  Status   SARS Coronavirus 2 NEGATIVE NEGATIVE Final    Comment: (NOTE) SARS-CoV-2 target nucleic acids are NOT DETECTED.  The SARS-CoV-2 RNA is generally detectable in upper and lower respiratory specimens during the acute phase of infection. Negative results do not preclude SARS-CoV-2 infection, do not rule out co-infections with other pathogens, and should not be used as the sole basis for treatment or other patient management decisions. Negative results must be combined with clinical observations, patient history, and epidemiological information. The expected result is Negative.  Fact Sheet for Patients: SugarRoll.be  Fact Sheet for Healthcare Providers: https://www.woods-mathews.com/  This test is not yet approved or cleared by the [place] FDA and  has been authorized for detection and/or diagnosis of SARS-CoV-2 by FDA under an Emergency Use Authorization (EUA). This EUA will remain  in effect (meaning this test can be used) for the duration of the COVID-19 declaration under Se ction 564(b)(1) of the Act, 21 U.S.C. section 360bbb-3(b)(1), unless the authorization is terminated or revoked sooner.  Performed at [facility], [place] [address], [place], [place] [address]   Blood Culture ID Panel (Reflexed)     Status: Abnormal   Collection Time: 01/23/20 12:13 AM  Result Value Ref Range Status   Enterococcus faecalis NOT DETECTED NOT DETECTED Final   Enterococcus Faecium NOT DETECTED NOT DETECTED Final   Listeria monocytogenes NOT DETECTED NOT DETECTED Final   Staphylococcus species NOT DETECTED NOT DETECTED Final   Staphylococcus aureus (BCID) NOT DETECTED NOT DETECTED Final   Staphylococcus epidermidis NOT DETECTED NOT DETECTED Final   Staphylococcus lugdunensis NOT DETECTED NOT DETECTED Final   Streptococcus species NOT DETECTED NOT DETECTED Final   Streptococcus agalactiae NOT DETECTED NOT DETECTED  Final   Streptococcus  pneumoniae NOT DETECTED NOT DETECTED Final   Streptococcus pyogenes NOT DETECTED NOT DETECTED Final   A.calcoaceticus-baumannii NOT DETECTED NOT DETECTED Final   Bacteroides fragilis NOT DETECTED NOT DETECTED Final   Enterobacterales DETECTED (A) NOT DETECTED Final    Comment: Enterobacterales represent a large order of gram negative bacteria, not a single organism. CRITICAL RESULT CALLED TO, READ BACK BY AND VERIFIED WITH: M.SLAUGHTER,PHARMD AT 1012 ON 01/23/20 BY GM    Enterobacter cloacae complex NOT DETECTED NOT DETECTED Final   Escherichia coli DETECTED (A) NOT DETECTED Final    Comment: CRITICAL RESULT CALLED TO, READ BACK BY AND VERIFIED WITH: M.SLAUGHTER,PHARMD AT 1012 ON 01/23/20 BY GM    Klebsiella aerogenes NOT DETECTED NOT DETECTED Final   Klebsiella oxytoca NOT DETECTED NOT DETECTED Final   Klebsiella pneumoniae NOT DETECTED NOT DETECTED Final   Proteus species NOT DETECTED NOT DETECTED Final   Salmonella species NOT DETECTED NOT DETECTED Final   Serratia marcescens NOT DETECTED NOT DETECTED Final   Haemophilus influenzae NOT DETECTED NOT DETECTED Final   Neisseria meningitidis NOT DETECTED NOT DETECTED Final   Pseudomonas aeruginosa NOT DETECTED NOT DETECTED Final   Stenotrophomonas maltophilia NOT DETECTED NOT DETECTED Final   Candida albicans NOT DETECTED NOT DETECTED Final   Candida auris NOT DETECTED NOT DETECTED Final   Candida glabrata NOT DETECTED NOT DETECTED Final   Candida krusei NOT DETECTED NOT DETECTED Final   Candida parapsilosis NOT DETECTED NOT DETECTED Final   Candida tropicalis NOT DETECTED NOT DETECTED Final   Cryptococcus neoformans/gattii NOT DETECTED NOT DETECTED Final   CTX-M ESBL NOT DETECTED NOT DETECTED Final   Carbapenem resistance IMP NOT DETECTED NOT DETECTED Final   Carbapenem resistance KPC NOT DETECTED NOT DETECTED Final   Carbapenem resistance NDM NOT DETECTED NOT DETECTED Final   Carbapenem resist OXA 48 LIKE NOT DETECTED NOT DETECTED  Final   Carbapenem resistance VIM NOT DETECTED NOT DETECTED Final    Comment: Performed at [facility], [address] [place], [place] [address]  Urine Culture     Status: Abnormal   Collection Time: 01/23/20  4:04 AM   Specimen: Urine, Random  Result Value Ref Range Status   Specimen Description   Final    URINE, RANDOM Performed at [facility], [address] [place], [place] [address]    Special Requests   Final    NONE Performed at [facility], [address] [place], [place] [address]    Culture (A)  Final    50,000 COLONIES/mL PROTEUS MIRABILIS >=100,000 COLONIES/mL ESCHERICHIA COLI    Report Status 01/27/2020 FINAL  Final   Organism ID, Bacteria PROTEUS MIRABILIS (A)  Final   Organism ID, Bacteria ESCHERICHIA COLI (A)  Final      Susceptibility   Escherichia coli - MIC*    AMPICILLIN >=32 RESISTANT Resistant     CEFAZOLIN <=4 SENSITIVE Sensitive     CEFEPIME <=0.12 SENSITIVE Sensitive     CEFTRIAXONE <=0.25 SENSITIVE Sensitive     CIPROFLOXACIN <=0.25 SENSITIVE Sensitive     GENTAMICIN <=1 SENSITIVE Sensitive     IMIPENEM <=0.25 SENSITIVE Sensitive     NITROFURANTOIN <=16 SENSITIVE Sensitive     TRIMETH/SULFA >=320 RESISTANT Resistant     AMPICILLIN/SULBACTAM 4 SENSITIVE Sensitive     PIP/TAZO <=4 SENSITIVE Sensitive     * >=100,000 COLONIES/mL ESCHERICHIA COLI   Proteus mirabilis - MIC*    AMPICILLIN <=2 SENSITIVE Sensitive  CEFAZOLIN <=4 SENSITIVE Sensitive     CEFEPIME <=0.12 SENSITIVE Sensitive     CEFTRIAXONE <=0.25 SENSITIVE Sensitive     CIPROFLOXACIN <=0.25 SENSITIVE Sensitive     GENTAMICIN <=1 SENSITIVE Sensitive     IMIPENEM 8 INTERMEDIATE Intermediate     NITROFURANTOIN RESISTANT Resistant     TRIMETH/SULFA <=20 SENSITIVE Sensitive     AMPICILLIN/SULBACTAM <=2 SENSITIVE Sensitive     PIP/TAZO <=4 SENSITIVE Sensitive     * 50,000 COLONIES/mL PROTEUS MIRABILIS  MRSA PCR Screening     Status: None   Collection  Time: 01/23/20  5:23 AM   Specimen: Nasopharyngeal  Result Value Ref Range Status   MRSA by PCR NEGATIVE NEGATIVE Final    Comment:        The GeneXpert MRSA Assay (FDA approved for NASAL specimens only), is one component of a comprehensive MRSA colonization surveillance program. It is not intended to diagnose MRSA infection nor to guide or monitor treatment for MRSA infections. Performed at [facility], [address], [place], [place] [address]      Radiology Studies: No results found.  Scheduled Meds: . atorvastatin  10 mg Oral Daily  . calcitRIOL  0.5 mcg Oral QHS  . cephALEXin  500 mg Oral Q8H  . Chlorhexidine Gluconate Cloth  6 each Topical Q0600  . donepezil  5 mg Oral QHS  . feeding supplement  237 mL Oral TID BM  . isosorbide dinitrate  5 mg Oral Daily  . mirtazapine  15 mg Oral QHS  . multivitamin with minerals  1 tablet Oral Daily  . warfarin  4 mg Oral ONCE-1600  . Warfarin - Pharmacist Dosing Inpatient   Does not apply q1600   Continuous Infusions: . sodium chloride 250 mL (01/23/20 1338)  . dextrose       LOS: 9 days   Time spent: 25 minutes  Lorella Nimrod, MD [facility]  If 7PM-7AM, please contact night-coverage [email or web address]  02/01/2020, 4:03 PM   This record has been created using Systems analyst. Errors have been sought and corrected,but may not always be located. Such creation errors do not reflect on the standard of care.

## 2020-02-01 NOTE — Progress Notes (Signed)
Daily Progress Note   Patient Name: Hailey Lopez       Date: 02/01/2020 DOB: September 03, 1929  Age: 87 y.o. MRN#: [id number] Attending Physician: Hailey Nimrod, MD Primary Care Physician: Hailey Stalker, PA-C Admit Date: 01/22/2020  Reason for Consultation/Follow-up: Establishing goals of care  Subjective: Patient sleeping during visit. She appears comfortable without signs of pain or distress.  Per niece, Hailey Lopez at bedside, cognitive status closer to baseline (pleasant confusion with dementia) and she is eating ~50% of meals. Hailey Lopez also enjoys her Ensure's.  GOC:  F/u GOC with niece/caregiver at bedside. Reviewed course of hospitalization including diagnoses, interventions, plan of care. Discussed dementia disease trajectory. Since she is showing some improvement in cognitive and nutritional status, we discussed plan to attempt SNF rehab on discharge. Yesterday, Hailey Lopez was able to stand and pivot to chair. Discussed SNF rehab with outpatient palliative f/u and explained that this can easily transition to comfort measures if she continues to decline. Hailey Lopez understands and agrees with plan.   Hailey Lopez is ready to complete MOST form today. Decisions include: DNR/DNI, comfort focused pathway if further decline, IVF/ABX for time trial if indicated, and NO feeding tube. Electronic [facility] MOST completed. Durable DNR completed. Copies placed in chart and given to Hailey Lopez.   Hailey Lopez again speaks about her hope for peace and comfort whenever Hailey Lopez's time comes to pass.   Therapeutic listening. Answered questions and concerns. PMT contact information given.   Length of Stay: 9  Current Medications: Scheduled Meds:  . atorvastatin  10 mg Oral Daily  . calcitRIOL  0.5 mcg Oral QHS  .  cephALEXin  500 mg Oral Q8H  . Chlorhexidine Gluconate Cloth  6 each Topical Q0600  . donepezil  5 mg Oral QHS  . feeding supplement  237 mL Oral TID BM  . isosorbide dinitrate  5 mg Oral Daily  . mirtazapine  15 mg Oral QHS  . multivitamin with minerals  1 tablet Oral Daily  . warfarin  4 mg Oral ONCE-1600  . Warfarin - Pharmacist Dosing Inpatient   Does not apply q1600    Continuous Infusions: . sodium chloride 250 mL (01/23/20 1338)  . dextrose      PRN Meds: sodium chloride, acetaminophen, diphenoxylate-atropine, docusate sodium, hydrALAZINE, LORazepam, ondansetron (ZOFRAN) IV, oxyCODONE, polyethylene glycol, traZODone  Physical Exam Vitals  and nursing note reviewed.  Constitutional:      General: She is sleeping.  Cardiovascular:     Rate and Rhythm: Normal rate.  Pulmonary:     Effort: No tachypnea, accessory muscle usage or respiratory distress.  Skin:    General: Skin is warm and dry.  Neurological:     Comments: Sleeping. Disoriented with baseline dementia.            Vital Signs: BP 131/81 (BP Location: Left Arm)   Pulse 75   Temp 98.3 F (36.8 C) (Oral)   Resp 16   Ht 5\' 4"  (1.626 m)   Wt 62.1 kg   SpO2 100%   BMI 23.50 kg/m  SpO2: SpO2: 100 % O2 Device: O2 Device: Room Air O2 Flow Rate: O2 Flow Rate (L/min): 2 L/min  Intake/output summary:   Intake/Output Summary (Last 24 hours) at 02/01/2020 1300 Last data filed at 02/01/2020 0040 Gross per 24 hour  Intake 240 ml  Output 400 ml  Net -160 ml   LBM: Last BM Date: 01/31/20 Baseline Weight: Weight: 63.5 kg Most recent weight: Weight: 62.1 kg       Palliative Assessment/Data: PPS 40%    Flowsheet Rows   Flowsheet Row Most Recent Value  Intake Tab   Referral Department Hospitalist  Unit at Time of Referral Med/Surg Unit  Palliative Care Primary Diagnosis Neurology  Palliative Care Type New Palliative care  Reason for referral Clarify Goals of Care  Date first seen by Palliative Care  01/31/20  Clinical Assessment   Palliative Performance Scale Score 40%  Psychosocial & Spiritual Assessment   Palliative Care Outcomes   Patient/Family meeting held? Yes  Who was at the meeting? niece  Palliative Care Outcomes Clarified goals of care, Counseled regarding hospice, Provided end of life care assistance, Provided advance care planning, Provided psychosocial or spiritual support, Completed durable DNR, ACP counseling assistance, Linked to palliative care logitudinal support      Patient Active Problem List   Diagnosis Date Noted  . Palliative care by specialist   . Goals of care, counseling/discussion   . Dementia without behavioral disturbance ([place])   . Delirium   . Ureterolithiasis   . Septic shock ([place]) 01/23/2020  . Acute metabolic encephalopathy 95/18/8416  . Lactic acidosis 12/24/2019  . Acute renal failure superimposed on chronic kidney disease ([place]) 12/24/2019  . UTI (urinary tract infection) 12/23/2019  . Long term (current) use of anticoagulants 07/31/2018  . SSS (sick sinus syndrome) ([place]) 11/25/2017  . Paroxysmal atrial fibrillation ([place]) 11/25/2017  . Pacemaker 11/25/2017  . History of pulmonary embolism 11/25/2017  . CKD (chronic kidney disease) stage 4, GFR 15-29 ml/min (HCC) 11/25/2017  . Essential hypertension 11/25/2017  . Hypercholesterolemia 11/25/2017    Palliative Care Assessment & Plan   Patient Profile: 87 y.o. female  with past medical history of Alzheimer's dementia, pacemaker, HTN, HLD, DVT, CVA, afib on Coumadin, CKD stage IV admitted on 01/22/2020 with diarrhea and syncope. Found to have severe sepsis secondary to obstructive uropathy and complicated E. Coli/Proteus UTI and E.coli/enterobacter bacteremia. Taken urgently for cystoscopy s/p stent placement. Patient requires 14 days of antibiotics, last day 02/06/20. Baseline Alzheimer's with poor cognitive, functional, and nutritional status this admission. Patient refusing oral intake x2 days  and withdrawn. CT head negative for acute changes. Palliative medicine consultation for goals of care.   Assessment: Severe sepsis resolved Obstructive uropathy Complicated E.coli and Proteus UTI E.coli/Enterobacterales bacteremia AKI with CKD stage IV Alzheimer's dementia Acute metabolic  encephalopathy Elevated troponin, likely demand ischemia Hx of PE/DVT Hypernatremia Thrombocytopenia  Recommendations/Plan:  Improvement in cognitive and nutritional status in the last few days.   Plan will be SNF rehab with outpatient palliative referral. Niece interested in further hospice discussions if her aunt's condition further declines.   MOST form completed. Decisions include: DNR/DNI, comfort focused care plan if further decline, IVF/ABX for time trial, and NO feeding tube. Electronic MOST completed. Durable DNR completed.   Continue assist and encouragement with meals.  Continue PT/OT efforts.  TOC for SNF rehab and outpatient palliative referral.   Code Status: DNR/DNI   Code Status Orders  (From admission, onward)         Start     Ordered   01/23/20 1336  Do not attempt resuscitation (DNR)  Continuous       Question Answer Comment  In the event of cardiac or respiratory ARREST Do not call a "code blue"   In the event of cardiac or respiratory ARREST Do not perform Intubation, CPR, defibrillation or ACLS   In the event of cardiac or respiratory ARREST Use medication by any route, position, wound care, and other measures to relive pain and suffering. May use oxygen, suction and manual treatment of airway obstruction as needed for comfort.      01/23/20 1335        Code Status History    Date Active Date Inactive Code Status Order ID Comments User Context   01/23/2020 0204 01/23/2020 1335 Full Code [id number]  Bradly Bienenstock, NP ED   12/23/2019 2013 12/28/2019 0032 Full Code [id number]  Mansy, Arvella Merles, MD ED   Advance Care Planning Activity       Prognosis:  Poor  long-term prognosis with progressive Alzheimer's dementia, declining functional/cognitive/nutritional status  Discharge Planning:  SNF rehab with outpatient palliative  Care plan was discussed with niece Hailey Lopez), RN, Dr. Reesa Chew  Thank you for allowing the Palliative Medicine Team to assist in the care of this patient.   Total Time 50 Prolonged Time Billed no      Greater than 50%  of this time was spent counseling and coordinating care related to the above assessment and plan.  Ihor Dow, DNP, FNP-C Palliative Medicine Team  Phone: [phone] Fax: [phone]  Please contact Palliative Medicine Team phone at [phone] for questions and concerns.

## 2020-02-01 NOTE — Care Management Important Message (Signed)
Important Message  Patient Details  Name: Hailey Lopez MRN: [id number] Date of Birth: [birth year]   Medicare Important Message Given:  Yes  Talked with the niece, [name] reviewed the Important Message from [facility] and a copy left with the patient in the room.  Juliann Pulse A Ethell Blatchford 02/01/2020, 4:03 PM

## 2020-02-02 DIAGNOSIS — N201 Calculus of ureter: Secondary | ICD-10-CM | POA: Diagnosis not present

## 2020-02-02 DIAGNOSIS — N390 Urinary tract infection, site not specified: Secondary | ICD-10-CM | POA: Diagnosis not present

## 2020-02-02 DIAGNOSIS — F0390 Unspecified dementia without behavioral disturbance: Secondary | ICD-10-CM | POA: Diagnosis not present

## 2020-02-02 DIAGNOSIS — N179 Acute kidney failure, unspecified: Secondary | ICD-10-CM | POA: Diagnosis not present

## 2020-02-02 LAB — BASIC METABOLIC PANEL
Anion gap: 9 (ref 5–15)
BUN: 33 mg/dL — ABNORMAL HIGH (ref 8–23)
CO2: 26 mmol/L (ref 22–32)
Calcium: 9.5 mg/dL (ref 8.9–10.3)
Chloride: 108 mmol/L (ref 98–111)
Creatinine, Ser: 1.77 mg/dL — ABNORMAL HIGH (ref 0.44–1.00)
GFR, Estimated: 27 mL/min — ABNORMAL LOW (ref >60)
Glucose, Bld: 116 mg/dL — ABNORMAL HIGH (ref 70–99)
Potassium: 4.6 mmol/L (ref 3.5–5.1)
Sodium: 143 mmol/L (ref 135–145)

## 2020-02-02 LAB — PROTIME-INR
INR: 1.3 — ABNORMAL HIGH (ref 0.8–1.2)
Prothrombin Time: 15.7 seconds — ABNORMAL HIGH (ref 11.4–15.2)

## 2020-02-02 MED ORDER — WARFARIN SODIUM 4 MG PO TABS
4.0000 mg | ORAL_TABLET | Freq: Once | ORAL | Status: AC
  Administered 2020-02-02: 4 mg via ORAL
  Filled 2020-02-02: qty 1

## 2020-02-02 MED ORDER — LACTATED RINGERS IV SOLN
INTRAVENOUS | Status: DC
  Administered 2020-02-04: 50 mL/h via INTRAVENOUS

## 2020-02-02 NOTE — Progress Notes (Signed)
[place] for warfarin Indication: atrial fibrillation   Labs: Recent Labs    01/31/20 0121 02/01/20 0111 02/02/20 0727  LABPROT 18.4* 16.5* 15.7*  INR 1.6* 1.4* 1.3*  CREATININE  --   --  1.77*    Estimated Creatinine Clearance: 19 mL/min (A) (by C-G formula based on SCr of 1.77 mg/dL (H)).   Medical History: Past Medical History:  Diagnosis Date  . Alzheimer's disease ([place])   . Atrial fibrillation ([place])   . Chronic kidney disease    Stage 4  . CVA (cerebrovascular accident) ([place])   . DVT (deep venous thrombosis) ([place]) 2013   LLE  . Hyperlipidemia   . Hypertension   . Pacemaker   . Pulmonary embolism [facility])     Assessment: 87 year old female presented with vomiting and diarrhea. Found to have left proximal ureteral calculus with obstruction, now s/p left ureteral stent placement. Blood cultures with 4/4 bottles E.coli, likely urinary source. Patient with h/o afib on warfarin PTA. Last known home dose warfarin 3 mg daily. INR therapeutic on admission.  Drug-drug interactions:cephalexin  Albumin: 3.3 >> 2.4 >> 2.2 >> 2.4  Date INR Dose 1/4 2.1 3 mg (patient reported PTA) 1/5 -- Held 1/6 3.0 Held 1/7 2.6 3 mg 1/8 2.6 3 mg 1/9 2.9 2 mg 1/10     3.0 DOSE HELD 1/11 2.3       2 mg 1/12     1.8       2.5 mg 1/13     1.6 3mg  1/14 1.4 4 mg 1/15  1.3  Goal of Therapy:  INR 2-3 Monitor platelets by anticoagulation protocol: Yes   Plan:  --INR is subtherapeutic today. Will order warfarin 4mg  x 1 dose again today. CBC stable  --Continue to monitor INR daily per protocol and CBC at least every 3 days.   Chinita Greenland PharmD Clinical Pharmacist 02/02/2020

## 2020-02-02 NOTE — Progress Notes (Signed)
PROGRESS NOTE    Hailey Lopez  ASN:[id number] DOB: [birth year] DOA: 01/22/2020 PCP: Marda Stalker, PA-C   Brief Narrative: Taken from prior notes. Patient is 87 year old female with past medical history of A. fib-on Coumadin, CKD stage IV, hypertension, hyperlipidemia, CVA, Alzheimer's dementia presents to emergency department with vomiting, generalized weakness, one episode of diarrhea and syncope.  ED course: Patient afebrile, v BP: 145/108, pulse: 136, EKG shows sinus tachycardia and nonspecific ST-T wave changes.  Sodium 146, bicarb 20, anion gap 16, BUN 44, creatinine: 3.45, lactic acid 8.1, procalcitonin 80.51, WBC 11.7, INR: 2.1.  UA is consistent with UTI.  Chest x-ray negative for acute findings.  CT head is negative.  CT abdomen and pelvis showed mild left hydronephrosis with perinephric stranding due to obstructing 15 mm calculus in the left Vitro pelvic junction.  She met sepsis criteria therefore she was given 2 L of IV fluid bolus and started on cefepime.  Urology was consulted.  She tested negative for COVID-19.  Patient admitted under PCCM.  [facility] hospitalist took over care from 01/23/2018.  Patient was treated for severe sepsis secondary to obstructive uropathy and complicated E. coli and Proteus UTI and E. coli/Enterobacter bacteremia. S/p ureteral stent placement by urology.  Patient appears to her baseline, palliative care was also consulted.  Plan is to go to SNF and outpatient palliative/hospice care.  Waiting for placement now.  Subjective: Patient seems clinically improving, started talking and eating.  She appears little upset and did not talk with me this morning, niece at bedside stating that they just cleaned her up and give her a bath and she did not like it, according to niece she did drink her boost and she was talking with her earlier.  Assessment & Plan:   Active Problems:   Acute renal failure superimposed on chronic kidney disease ([place])   Septic shock  (HCC)   Dementia without behavioral disturbance ([place])   Delirium   Ureterolithiasis   Palliative care by specialist   Goals of care, counseling/discussion  Severe sepsis in the setting of obstructive uropathy/complicated Ecoli & Proteus UTI, E. coli/Enterobacterales bacteremia: -Status post left ureteral stent placement by urology.  POD 7. She was initially managed in ICU and received broad-spectrum antibiotics which were narrow down to cefazolin and now on Keflex for 6 more days. Clinically improving with improvement in leukocytosis and procalcitonin.  Remained afebrile. She will need 14 days of antibiotics and last day of therapy would be on 02/06/2020. -Palliative consult at the request of niece-final plan is to discharge to rehab and outpatient follow-up with palliative care and hospice. -Continue with antibiotics.  AKI with CKD stage IV.  Baseline creatinine around 1.9.  Slowly improving, on admission it was 3.87, today it was 1.77, at baseline now. Nephrology was consulted and she is not a candidate for long-term dialysis. -Continue to monitor. -Avoid nephrotoxins  Acute metabolic encephalopathy.  Resolved, appears to be at her baseline.  Most likely with sepsis.  Per niece she did had some underlying dementia. CT head shows no acute changes.  Advanced senescent  changes and borderline ventricular enlargement. B12, TSH and folate are within normal limit.  Ammonia within normal limit. -Palliative care consult.-Plan is to follow-up at SNF. -PT is recommending SNF placement. -TOC to look for placement  Elevated troponin: -Troponin 214 trended up to 764-819.  EKG: No acute ST-T wave changes noted.  Likely in the setting of demand ischemia in the setting of sepsis -Continue to monitor.  Patient denies  any ACS symptoms.  Jerking movements: -Unknown etiology?.  Improving as per RN & the family.  Reviewed meds with pharmacy.  Vitals remained stable.  Could be secondary to  pain/discomfort due to purewick.  Continue oxycodone and Ativan as needed. -EEG: No seizure, diffuse encephalopathy  Hypertension.  Blood pressure within goal. -Continue Isordil.  History of PE and DVT: Continue Coumadin as per pharmacy. - Monitor PT/INR  Hyperlipidemia: Continue atorvastatin  Alzheimer's dementia: Continue donepezil  Hypernatremia.  Most likely secondary to poor p.o. intake.  Resolved with IV fluid.  Thrombocytopenia: Resolved.  No signs of active bleeding.   Objective: Vitals:   02/02/20 0232 02/02/20 0814 02/02/20 1149 02/02/20 1537  BP: (!) 165/89 (!) 134/40 (!) 154/83 (!) 162/81  Pulse: 90 63 87 82  Resp: '18 16 17 17  ' Temp: 98 F (36.7 C) 98.5 F (36.9 C) 98.2 F (36.8 C) 97.9 F (36.6 C)  TempSrc:      SpO2: 99% 97% 100% 100%  Weight:      Height:        Intake/Output Summary (Last 24 hours) at 02/02/2020 1609 Last data filed at 02/02/2020 1449 Gross per 24 hour  Intake 240 ml  Output --  Net 240 ml   Filed Weights   01/31/20 0605 02/01/20 0023 02/01/20 0500  Weight: 62.7 kg 62.1 kg 62.1 kg    Examination:  General.  Frail elderly lady, in no acute distress. Pulmonary.  Lungs clear bilaterally, normal respiratory effort. CV.  Regular rate and rhythm, no JVD, rub or murmur. Abdomen.  Soft, nontender, nondistended, BS positive. CNS.  Alert and oriented x3.  No focal neurologic deficit. Extremities.  No edema, no cyanosis, pulses intact and symmetrical. Psychiatry.  Judgment and insight appears normal.  DVT prophylaxis: Coumadin Code Status: DNR Family Communication: Niece was updated at bedside Disposition Plan:  Status is: Inpatient  Remains inpatient appropriate because:Inpatient level of care appropriate due to severity of illness   Dispo: The patient is from: Home              Anticipated d/c is to: SNF              Anticipated d/c date is: 1-2 days.              Patient currently is medically stable.  Waiting for SNF  availability.   Consultants:   PCCM  ID  Nephrology  urology  Procedures:   Left ureteral stent placement  Antimicrobials: Cefazolin  Data Reviewed: I have personally reviewed following labs and imaging studies  CBC: Recent Labs  Lab 01/27/20 0428 01/28/20 0238 01/30/20 0241  WBC 7.4 6.5 7.2  HGB 10.4* 10.3* 10.8*  HCT 31.7* 30.9* 32.0*  MCV 92.2 91.2 91.2  PLT 148* 192 888   Basic Metabolic Panel: Recent Labs  Lab 01/27/20 0428 01/28/20 0238 01/29/20 0454 01/30/20 0241 02/02/20 0727  NA 148* 146* 147* 143 143  K 3.8 3.9 4.0 3.7 4.6  CL 118* 113* 113* 108 108  CO2 '23 25 23 27 26  ' GLUCOSE 120* 112* 104* 112* 116*  BUN 31* 33* 33* 30* 33*  CREATININE 2.30* 2.13* 2.03* 2.02* 1.77*  CALCIUM 9.1 9.0 9.1 9.0 9.5  MG 1.8  --   --  1.8  --    GFR: Estimated Creatinine Clearance: 19 mL/min (A) (by C-G formula based on SCr of 1.77 mg/dL (H)). Liver Function Tests: Recent Labs  Lab 01/27/20 0428 01/28/20 0238 01/30/20 0241  AST 33 39  40  ALT '20 19 18  ' ALKPHOS 50 50 53  BILITOT 0.8 0.6 0.6  PROT 5.6* 5.9* 6.5  ALBUMIN 2.2* 2.4* 2.6*   No results for input(s): LIPASE, AMYLASE in the last 168 hours. Recent Labs  Lab 01/29/20 1155  AMMONIA 18   Coagulation Profile: Recent Labs  Lab 01/29/20 0454 01/30/20 0241 01/31/20 0121 02/01/20 0111 02/02/20 0727  INR 2.3* 1.8* 1.6* 1.4* 1.3*   Cardiac Enzymes: No results for input(s): CKTOTAL, CKMB, CKMBINDEX, TROPONINI in the last 168 hours. BNP (last 3 results) No results for input(s): PROBNP in the last 8760 hours. HbA1C: No results for input(s): HGBA1C in the last 72 hours. CBG: No results for input(s): GLUCAP in the last 168 hours. Lipid Profile: No results for input(s): CHOL, HDL, LDLCALC, TRIG, CHOLHDL, LDLDIRECT in the last 72 hours. Thyroid Function Tests: No results for input(s): TSH, T4TOTAL, FREET4, T3FREE, THYROIDAB in the last 72 hours. Anemia Panel: No results for input(s):  VITAMINB12, FOLATE, FERRITIN, TIBC, IRON, RETICCTPCT in the last 72 hours. Sepsis Labs: No results for input(s): PROCALCITON, LATICACIDVEN in the last 168 hours.  No results found for this or any previous visit (from the past 240 hour(s)).   Radiology Studies: No results found.  Scheduled Meds: . atorvastatin  10 mg Oral Daily  . calcitRIOL  0.5 mcg Oral QHS  . cephALEXin  500 mg Oral Q8H  . Chlorhexidine Gluconate Cloth  6 each Topical Q0600  . donepezil  5 mg Oral QHS  . feeding supplement  237 mL Oral TID BM  . isosorbide dinitrate  5 mg Oral Daily  . mirtazapine  15 mg Oral QHS  . multivitamin with minerals  1 tablet Oral Daily  . Warfarin - Pharmacist Dosing Inpatient   Does not apply q1600   Continuous Infusions: . sodium chloride 250 mL (01/23/20 1338)  . lactated ringers 50 mL/hr at 02/02/20 0848     LOS: 10 days   Time spent: 25 minutes  Lorella Nimrod, MD [facility]  If 7PM-7AM, please contact night-coverage [email or web address]  02/02/2020, 4:09 PM   This record has been created using Systems analyst. Errors have been sought and corrected,but may not always be located. Such creation errors do not reflect on the standard of care.

## 2020-02-03 DIAGNOSIS — F0390 Unspecified dementia without behavioral disturbance: Secondary | ICD-10-CM | POA: Diagnosis not present

## 2020-02-03 DIAGNOSIS — N390 Urinary tract infection, site not specified: Secondary | ICD-10-CM | POA: Diagnosis not present

## 2020-02-03 DIAGNOSIS — N201 Calculus of ureter: Secondary | ICD-10-CM | POA: Diagnosis not present

## 2020-02-03 DIAGNOSIS — N179 Acute kidney failure, unspecified: Secondary | ICD-10-CM | POA: Diagnosis not present

## 2020-02-03 LAB — PROTIME-INR
INR: 1.5 — ABNORMAL HIGH (ref 0.8–1.2)
Prothrombin Time: 17.1 seconds — ABNORMAL HIGH (ref 11.4–15.2)

## 2020-02-03 MED ORDER — WARFARIN SODIUM 4 MG PO TABS
4.0000 mg | ORAL_TABLET | Freq: Once | ORAL | Status: AC
  Administered 2020-02-03: 4 mg via ORAL
  Filled 2020-02-03: qty 1

## 2020-02-03 MED ORDER — WARFARIN SODIUM 3 MG PO TABS: 3.5000 mg | ORAL_TABLET | Freq: Once | ORAL | Status: DC

## 2020-02-03 NOTE — Progress Notes (Signed)
PROGRESS NOTE     Hailey Lopez  XIP:[id number] DOB: [birth year] DOA: 01/22/2020 PCP: Marda Stalker, PA-C   Brief Narrative: Taken from prior notes. Patient is 87 year old female with past medical history of A. fib-on Coumadin, CKD stage IV, hypertension, hyperlipidemia, CVA, Alzheimer's dementia presents to emergency department with vomiting, generalized weakness, one episode of diarrhea and syncope.  ED course: Patient afebrile, v BP: 145/108, pulse: 136, EKG shows sinus tachycardia and nonspecific ST-T wave changes.  Sodium 146, bicarb 20, anion gap 16, BUN 44, creatinine: 3.45, lactic acid 8.1, procalcitonin 80.51, WBC 11.7, INR: 2.1.  UA is consistent with UTI.  Chest x-ray negative for acute findings.  CT head is negative.  CT abdomen and pelvis showed mild left hydronephrosis with perinephric stranding due to obstructing 15 mm calculus in the left Vitro pelvic junction.  She met sepsis criteria therefore she was given 2 L of IV fluid bolus and started on cefepime.  Urology was consulted.  She tested negative for COVID-19.  Patient admitted under PCCM.  [facility] took over care from 01/23/2018.  Patient was treated for severe sepsis secondary to obstructive uropathy and complicated E. coli and Proteus UTI and E. coli/Enterobacter bacteremia. S/p ureteral stent placement by urology.  Patient appears to her baseline, palliative care was also consulted.  Plan is to go to SNF and outpatient palliative/hospice care.  Waiting for placement now.  Subjective: Patient was not rested comfortably when seen today.  She was just answering yes and no.  Drink some of her juice and boost.  Assessment & Plan:   Active Problems:   Acute renal failure superimposed on chronic kidney disease ([place])   Septic shock (HCC)   Dementia without behavioral disturbance ([place])   Delirium   Ureterolithiasis   Palliative care by specialist   Goals of care, counseling/discussion  Severe sepsis in the  setting of obstructive uropathy/complicated Ecoli & Proteus UTI, E. coli/Enterobacterales bacteremia: -Status post left ureteral stent placement by urology.  POD 7. She was initially managed in ICU and received broad-spectrum antibiotics which were narrow down to cefazolin and now on Keflex for 6 more days. Clinically improving with improvement in leukocytosis and procalcitonin.  Remained afebrile. She will need 14 days of antibiotics and last day of therapy would be on 02/06/2020. -Palliative consult at the request of niece-final plan is to discharge to rehab and outpatient follow-up with palliative care and hospice. -Continue with antibiotics.  AKI with CKD stage IV.  Baseline creatinine around 1.9.  Slowly improving, on admission it was 3.87, today it was 1.77, at baseline now. Nephrology was consulted and she is not a candidate for long-term dialysis. -Continue to monitor. -Avoid nephrotoxins  Acute metabolic encephalopathy.  Resolved, appears to be at her baseline.  Most likely with sepsis.  Per niece she did had some underlying dementia. CT head shows no acute changes.  Advanced senescent  changes and borderline ventricular enlargement. B12, TSH and folate are within normal limit.  Ammonia within normal limit. -Palliative care consult.-Plan is to follow-up at SNF. -PT is recommending SNF placement. -TOC to look for placement  Elevated troponin: -Troponin 214 trended up to 764-819.  EKG: No acute ST-T wave changes noted.  Likely in the setting of demand ischemia in the setting of sepsis -Continue to monitor.  Patient denies any ACS symptoms.  Jerking movements: -Unknown etiology?.  Improving as per RN & the family.  Reviewed meds with pharmacy.  Vitals remained stable.  Could be secondary to pain/discomfort due to  purewick.  Continue oxycodone and Ativan as needed. -EEG: No seizure, diffuse encephalopathy  Hypertension.  Blood pressure mildly elevated. -Continue Isordil.  History  of PE and DVT: Continue Coumadin as per pharmacy. - Monitor PT/INR  Hyperlipidemia: Continue atorvastatin  Alzheimer's dementia: Continue donepezil  Hypernatremia.  Most likely secondary to poor p.o. intake.  Resolved with IV fluid.  Thrombocytopenia: Resolved.  No signs of active bleeding.   Objective: Vitals:   02/02/20 2056 02/02/20 2333 02/03/20 0442 02/03/20 0810  BP: 129/79 (!) 154/134 (!) 155/68 (!) 155/90  Pulse: 69 77 66 74  Resp: '16 16 16 17  ' Temp: 98 F (36.7 C) 98.3 F (36.8 C) 98.3 F (36.8 C) 97.9 F (36.6 C)  TempSrc:      SpO2: 100% 100% 95% 100%  Weight:      Height:        Intake/Output Summary (Last 24 hours) at 02/03/2020 1518 Last data filed at 02/03/2020 1407 Gross per 24 hour  Intake 1758.57 ml  Output --  Net 1758.57 ml   Filed Weights   01/31/20 0605 02/01/20 0023 02/01/20 0500  Weight: 62.7 kg 62.1 kg 62.1 kg    Examination:  General.  Chronically ill appearing, frail elderly lady, in no acute distress. Pulmonary.  Lungs clear bilaterally, normal respiratory effort. CV.  Regular rate and rhythm, no JVD, rub or murmur. Abdomen.  Soft, nontender, nondistended, BS positive. CNS.  Alert and oriented x3.  No focal neurologic deficit. Extremities.  No edema, no cyanosis, pulses intact and symmetrical. Psychiatry.  Judgment and insight appears impaired.  DVT prophylaxis: Coumadin Code Status: DNR Family Communication: Niece was updated on phone. Disposition Plan:  Status is: Inpatient  Remains inpatient appropriate because:Inpatient level of care appropriate due to severity of illness   Dispo: The patient is from: Home              Anticipated d/c is to: SNF              Anticipated d/c date is: 1-2 days.              Patient currently is medically stable.  Waiting for SNF availability.   Consultants:   PCCM  ID  Nephrology  urology  Procedures:   Left ureteral stent placement  Antimicrobials: Cefazolin  Data  Reviewed: I have personally reviewed following labs and imaging studies  CBC: Recent Labs  Lab 01/28/20 0238 01/30/20 0241  WBC 6.5 7.2  HGB 10.3* 10.8*  HCT 30.9* 32.0*  MCV 91.2 91.2  PLT 192 614   Basic Metabolic Panel: Recent Labs  Lab 01/28/20 0238 01/29/20 0454 01/30/20 0241 02/02/20 0727  NA 146* 147* 143 143  K 3.9 4.0 3.7 4.6  CL 113* 113* 108 108  CO2 '25 23 27 26  ' GLUCOSE 112* 104* 112* 116*  BUN 33* 33* 30* 33*  CREATININE 2.13* 2.03* 2.02* 1.77*  CALCIUM 9.0 9.1 9.0 9.5  MG  --   --  1.8  --    GFR: Estimated Creatinine Clearance: 19 mL/min (A) (by C-G formula based on SCr of 1.77 mg/dL (H)). Liver Function Tests: Recent Labs  Lab 01/28/20 0238 01/30/20 0241  AST 39 40  ALT 19 18  ALKPHOS 50 53  BILITOT 0.6 0.6  PROT 5.9* 6.5  ALBUMIN 2.4* 2.6*   No results for input(s): LIPASE, AMYLASE in the last 168 hours. Recent Labs  Lab 01/29/20 1155  AMMONIA 18   Coagulation Profile: Recent Labs  Lab 01/30/20  5848 01/31/20 0121 02/01/20 0111 02/02/20 0727 02/03/20 1159  INR 1.8* 1.6* 1.4* 1.3* 1.5*   Cardiac Enzymes: No results for input(s): CKTOTAL, CKMB, CKMBINDEX, TROPONINI in the last 168 hours. BNP (last 3 results) No results for input(s): PROBNP in the last 8760 hours. HbA1C: No results for input(s): HGBA1C in the last 72 hours. CBG: No results for input(s): GLUCAP in the last 168 hours. Lipid Profile: No results for input(s): CHOL, HDL, LDLCALC, TRIG, CHOLHDL, LDLDIRECT in the last 72 hours. Thyroid Function Tests: No results for input(s): TSH, T4TOTAL, FREET4, T3FREE, THYROIDAB in the last 72 hours. Anemia Panel: No results for input(s): VITAMINB12, FOLATE, FERRITIN, TIBC, IRON, RETICCTPCT in the last 72 hours. Sepsis Labs: No results for input(s): PROCALCITON, LATICACIDVEN in the last 168 hours.  No results found for this or any previous visit (from the past 240 hour(s)).   Radiology Studies: No results found.  Scheduled  Meds: . atorvastatin  10 mg Oral Daily  . calcitRIOL  0.5 mcg Oral QHS  . cephALEXin  500 mg Oral Q8H  . Chlorhexidine Gluconate Cloth  6 each Topical Q0600  . donepezil  5 mg Oral QHS  . feeding supplement  237 mL Oral TID BM  . isosorbide dinitrate  5 mg Oral Daily  . mirtazapine  15 mg Oral QHS  . multivitamin with minerals  1 tablet Oral Daily  . warfarin  4 mg Oral ONCE-1600  . Warfarin - Pharmacist Dosing Inpatient   Does not apply q1600   Continuous Infusions: . sodium chloride 250 mL (01/23/20 1338)  . lactated ringers 50 mL/hr at 02/03/20 0507     LOS: 11 days   Time spent: 25 minutes  Lorella Nimrod, MD [facility]  If 7PM-7AM, please contact night-coverage [email or web address]  02/03/2020, 3:18 PM   This record has been created using Systems analyst. Errors have been sought and corrected,but may not always be located. Such creation errors do not reflect on the standard of care.

## 2020-02-03 NOTE — Progress Notes (Signed)
[place] for warfarin Indication: atrial fibrillation   Labs: Recent Labs    02/01/20 0111 02/02/20 0727 02/03/20 1159  LABPROT 16.5* 15.7* 17.1*  INR 1.4* 1.3* 1.5*  CREATININE  --  1.77*  --     Estimated Creatinine Clearance: 19 mL/min (A) (by C-G formula based on SCr of 1.77 mg/dL (H)).   Medical History: Past Medical History:  Diagnosis Date  . Alzheimer's disease ([place])   . Atrial fibrillation ([place])   . Chronic kidney disease    Stage 4  . CVA (cerebrovascular accident) ([place])   . DVT (deep venous thrombosis) ([place]) 2013   LLE  . Hyperlipidemia   . Hypertension   . Pacemaker   . Pulmonary embolism [facility])     Assessment: 87 year old female presented with vomiting and diarrhea. Found to have left proximal ureteral calculus with obstruction, now s/p left ureteral stent placement. Blood cultures with 4/4 bottles E.coli, likely urinary source. Patient with h/o afib on warfarin PTA. Last known home dose warfarin 3 mg daily. INR therapeutic on admission.  Drug-drug interactions:cephalexin   Albumin: 3.3 >> 2.4 >> 2.2 >> 2.4  Date INR Dose 1/4 2.1 3 mg (patient reported PTA) 1/5 -- Held 1/6 3.0 Held 1/7 2.6 3 mg 1/8 2.6 3 mg 1/9 2.9 2 mg 1/10     3.0 DOSE HELD 1/11 2.3       2 mg 1/12     1.8       2.5 mg 1/13     1.6 3mg  1/14 1.4 4 mg 1/15  1.3 4 mg 1/16 1.5  Goal of Therapy:  INR 2-3 Monitor platelets by anticoagulation protocol: Yes   Plan:  --INR is subtherapeutic today but increasing. Will order warfarin 4 mg x 1 dose again today then would anticipate slightly decreasing dose if INR continues to increase. Patient refused labs initially this am- lab had to come back to draw  --Continue to monitor INR daily per protocol and CBC at least every 3 days.   Chinita Greenland PharmD Clinical Pharmacist 02/03/2020

## 2020-02-04 LAB — CBC
HCT: 31 % — ABNORMAL LOW (ref 36.0–46.0)
Hemoglobin: 10.6 g/dL — ABNORMAL LOW (ref 12.0–15.0)
MCH: 30.5 pg (ref 26.0–34.0)
MCHC: 34.2 g/dL (ref 30.0–36.0)
MCV: 89.3 fL (ref 80.0–100.0)
Platelets: 453 10*3/uL — ABNORMAL HIGH (ref 150–400)
RBC: 3.47 MIL/uL — ABNORMAL LOW (ref 3.87–5.11)
RDW: 15 % (ref 11.5–15.5)
WBC: 7.9 10*3/uL (ref 4.0–10.5)
nRBC: 0 % (ref 0.0–0.2)

## 2020-02-04 LAB — PROTIME-INR
INR: 1.7 — ABNORMAL HIGH (ref 0.8–1.2)
Prothrombin Time: 19.7 seconds — ABNORMAL HIGH (ref 11.4–15.2)

## 2020-02-04 MED ORDER — WARFARIN SODIUM 4 MG PO TABS
4.0000 mg | ORAL_TABLET | Freq: Once | ORAL | Status: AC
  Administered 2020-02-04: 4 mg via ORAL
  Filled 2020-02-04: qty 1

## 2020-02-04 NOTE — Care Management Important Message (Signed)
Important Message  Patient Details  Name: Hailey Lopez MRN: [id number] Date of Birth: 10-04-1929   Medicare Important Message Given:  Yes     Juliann Pulse A Iyana Topor 02/04/2020, 11:30 AM

## 2020-02-04 NOTE — Progress Notes (Signed)
[place] for warfarin Indication: atrial fibrillation   Labs: Recent Labs    02/02/20 0727 02/03/20 1159 02/04/20 0812  HGB  --   --  10.6*  HCT  --   --  31.0*  PLT  --   --  453*  LABPROT 15.7* 17.1* 19.7*  INR 1.3* 1.5* 1.7*  CREATININE 1.77*  --   --     Estimated Creatinine Clearance: 19 mL/min (A) (by C-G formula based on SCr of 1.77 mg/dL (H)).   Medical History: Past Medical History:  Diagnosis Date  . Alzheimer's disease ([place])   . Atrial fibrillation ([place])   . Chronic kidney disease    Stage 4  . CVA (cerebrovascular accident) ([place])   . DVT (deep venous thrombosis) ([place]) 2013   LLE  . Hyperlipidemia   . Hypertension   . Pacemaker   . Pulmonary embolism [facility])     Assessment: 87 year old female presented with vomiting and diarrhea. Found to have left proximal ureteral calculus with obstruction, now s/p left ureteral stent placement. Blood cultures with 4/4 bottles E.coli, likely urinary source. Patient with h/o afib on warfarin PTA. Last known home dose warfarin 3 mg daily. INR therapeutic on admission.  Drug-drug interactions:cephalexin   Albumin: 3.3 >> 2.4 >> 2.2 >> 2.4  Date INR Dose 1/4 2.1 3 mg (patient reported PTA) 1/5 -- Held 1/6 3.0 Held 1/7 2.6 3 mg 1/8 2.6 3 mg 1/9 2.9 2 mg 1/10     3.0 DOSE HELD 1/11 2.3       2 mg 1/12     1.8       2.5 mg 1/13     1.6 '3mg'$  1/14 1.4 4 mg 1/15  1.3 4 mg 1/16 1.5 4 mg 1/17 1.7  Goal of Therapy:  INR 2-3 Monitor platelets by anticoagulation protocol: Yes   Plan:  --INR is subtherapeutic today but increasing. Will order warfarin 4 mg x 1 dose again today then would anticipate slightly decreasing dose if INR continues to increase.   --Continue to monitor INR daily per protocol and CBC at least every 3 days.   Pearla Dubonnet, PharmD Clinical Pharmacist 02/04/2020 11:18 AM

## 2020-02-04 NOTE — Progress Notes (Signed)
Nutrition Follow-up  RD working remotely.  DOCUMENTATION CODES:   Not applicable  INTERVENTION:  Continue Ensure Enlive po TID, each supplement provides 350 kcal and 20 grams of protein.  Continue Magic cup TID with meals, each supplement provides 290 kcal and 9 grams of protein.  Continue MVI po daily.  NUTRITION DIAGNOSIS:   Inadequate oral intake related to decreased appetite as evidenced by per patient/family report,meal completion < 50%.  Ongoing.  GOAL:   Patient will meet greater than or equal to 90% of their needs  Progressing.  MONITOR:   PO intake,Supplement acceptance,Diet advancement,Labs,Weight trends,I & O's  REASON FOR ASSESSMENT:   Consult Assessment of nutrition requirement/status,Poor PO,Calorie Count  ASSESSMENT:   87 year old female with PMHx of Alzheimer's dementia, HLD, CVA, CKD stage IV, HTN, pulmonary embolism, A-fib, hx pacemaker placement, DVT admitted with sepsis in setting of obstructive uropathy/complicated E coli UTI, E coli/enterobacterales bacteremia, AKI on CKD IV.  Patient unable to speak over the phone. Per documentation in chart patient eating 100% of her meals on Saturday and 50-100% of her meals on Sunday. Spoke with patient's niece/caregiver over the phone who reports this is not accurate. She reports patient still eating small amounts of meals. She does like the [employer] and can usually eat that at meals. Niece was unable to make it into the hospital yesterday but she reports that when she was given update by RN in the evening the patient had not had much to eat or drink yesterday. Patient is being given 3 Ensure per day but still only drinking partial serving. Plan is to continue encouragement and assistance at meals and offering patient food, beverages, and supplements she enjoys.  Patient is being followed by palliative medicine. Patient's niece does not want a feeding tube for patient. Plan is for patient to discharge to SNF with  outpatient palliative referral and then possible transition to hospice if condition further declines.   Medications reviewed and include: Remeron 15 mg QHS, MVI daily.  Labs reviewed: BUN 33, Creatinine 1.77.  Diet Order:   Diet Order            DIET - DYS 1 Room service appropriate? Yes; Fluid consistency: Thin  Diet effective now                EDUCATION NEEDS:   No education needs have been identified at this time  Skin:  Skin Assessment: Skin Integrity Issues: Skin Integrity Issues:: Incisions Incisions: closed incision perineum  Last BM:  01/27/2019 - type 6  Height:   Ht Readings from Last 1 Encounters:  01/23/20 '5\' 4"'$  (1.626 m)   Weight:   Wt Readings from Last 1 Encounters:  02/04/20 60.8 kg   BMI:  Body mass index is 23.01 kg/m.  Estimated Nutritional Needs:   Kcal:  1600-1800  Protein:  80-90 grams  Fluid:  1.6 L/day  Jacklynn Barnacle, MS, RD, LDN Pager number available on Amion

## 2020-02-04 NOTE — Progress Notes (Signed)
Physical Therapy Treatment Patient Details Name: Hailey Lopez MRN: [id number] DOB: 07/22/1929 Today's Date: 02/04/2020    History of Present Illness Per MD notes: Pt is an 87 year old female with past medical history of A. fib-on Coumadin, CKD stage IV, hypertension, hyperlipidemia, CVA, Alzheimer's dementia presents to emergency department with vomiting, generalized weakness, one episode of diarrhea and syncope.  MD assessment includes: Severe sepsis in the setting of obstructive uropathy/complicated Ecoli UTI, E. coli/Enterobacterales bacteremia, AKI on CKD stage IV, Elevated troponin likely demand ischemia, jerking movements of unkown etiology, HTN, hyponatremia, thrombocytopenia, Left proximal ureteral calculus with obstruction s/p cystoscopy and left ureteral stent placement, and debility.    PT Comments    Patient continues to require assistance with mobility efforts. Patient needs maximal encouragement to participate with rolling in bed. Patient resistive with attempts to sit upright. AAROM to PROM provided with BLE strengthening exercises in bed with cues for participation and technique. Recommend to continue PT to maximize independence and address remaining functional limitations. SNF recommended at discharge.     Follow Up Recommendations  SNF     Equipment Recommendations  Other (comment) (to be determined at next level of care)    Recommendations for Other Services       Precautions / Restrictions Precautions Precautions: Fall Restrictions Weight Bearing Restrictions: No    Mobility  Bed Mobility Overal bed mobility: Needs Assistance Bed Mobility: Rolling Rolling: Max assist         General bed mobility comments: maximal assistance for rolling to left and right multiple times each way to change soiled brief and pads. patient does reach across midline to faciliate rolling once to command, otherwise has difficulty following commands. patient was resistive with  further mobility attempts, unable to progress to sitting up on edge of bed. maximal cues for technique with mobility in bed and maximal encouragement provided for participation  Transfers                    Ambulation/Gait                 Stairs             Wheelchair Mobility    Modified Rankin (Stroke Patients Only)       Balance                                            Cognition Arousal/Alertness: Awake/alert Behavior During Therapy: Flat affect Overall Cognitive Status: History of cognitive impairments - at baseline Area of Impairment: Following commands                       Following Commands: Follows one step commands inconsistently       General Comments: patient following single step commands less than 25 % of the time      Exercises General Exercises - Lower Extremity Ankle Circles/Pumps: AAROM;PROM;Strengthening;Both;10 reps;Supine Heel Slides: PROM;AAROM;Strengthening;Both;10 reps;Supine Hip ABduction/ADduction: PROM;AAROM;Strengthening;Both;10 reps;Supine Straight Leg Raises: PROM;AAROM;Strengthening;Both;10 reps;Supine Other Exercises Other Exercises: verbal and visual cues for technique    General Comments        Pertinent Vitals/Pain Pain Assessment:  (no pain reported)    Home Living                      Prior Function  PT Goals (current goals can now be found in the care plan section) Acute Rehab PT Goals Patient Stated Goal: none stated PT Goal Formulation: Patient unable to participate in goal setting Time For Goal Achievement: 02/10/20 Potential to Achieve Goals: Fair Progress towards PT goals: Progressing toward goals    Frequency    Min 2X/week      PT Plan Current plan remains appropriate    Co-evaluation              AM-PAC PT "6 Clicks" Mobility   Outcome Measure  Help needed turning from your back to your side while in a flat bed without  using bedrails?: A Lot Help needed moving from lying on your back to sitting on the side of a flat bed without using bedrails?: Total Help needed moving to and from a bed to a chair (including a wheelchair)?: Total Help needed standing up from a chair using your arms (e.g., wheelchair or bedside chair)?: Total Help needed to walk in hospital room?: Total Help needed climbing 3-5 steps with a railing? : Total 6 Click Score: 7    End of Session   Activity Tolerance: Patient tolerated treatment well Patient left: in bed;with call bell/phone within reach;with bed alarm set Nurse Communication: Mobility status (discussed with nurse tech) PT Visit Diagnosis: Difficulty in walking, not elsewhere classified (R26.2);Muscle weakness (generalized) (M62.81)     Time: 0802-2336 PT Time Calculation (min) (ACUTE ONLY): 21 min  Charges:  $Therapeutic Activity: 8-22 mins                     Minna Merritts, PT, MPT    Percell Locus 02/04/2020, 4:42 PM

## 2020-02-04 NOTE — TOC Progression Note (Signed)
Transition of Care [facility]) - Progression Note    Patient Details  Name: Hailey Lopez MRN: [id number] Date of Birth: Jun 02, 1929  Transition [facility] CM/SW [place], LCSW Phone Number: 02/04/2020, 11:04 AM  Clinical Narrative:   CSW left voicemail for [facility] to inquire about bed availability. CSW re sent out patient's information to SNFs with pending bed requests in case [facility] is unable to take patient.    Expected Discharge Plan: Skilled Nursing Facility Barriers to Discharge: Continued Medical Work up  Expected Discharge Plan and Services Expected Discharge Plan: [place] arrangements for the past 2 months: Single Family Home                                       Social Determinants of Health (SDOH) Interventions    Readmission Risk Interventions Readmission Risk Prevention Plan 01/29/2020  Transportation Screening Complete  PCP or Specialist Appt within 5-7 Days Complete  Home Care Screening Complete  Medication Review (RN CM) Complete

## 2020-02-04 NOTE — Progress Notes (Signed)
PROGRESS NOTE    Hailey Lopez  TMH:[id number] DOB: Apr 30, 1929 DOA: 01/22/2020 PCP: Marda Stalker, PA-C   Brief Narrative: Taken from prior notes. Patient is 87 year old female with past medical history of A. fib-on Coumadin, CKD stage IV, hypertension, hyperlipidemia, CVA, Alzheimer's dementia presents to emergency department with vomiting, generalized weakness, one episode of diarrhea and syncope.  ED course: Patient afebrile, v BP: 145/108, pulse: 136, EKG shows sinus tachycardia and nonspecific ST-T wave changes.  Sodium 146, bicarb 20, anion gap 16, BUN 44, creatinine: 3.45, lactic acid 8.1, procalcitonin 80.51, WBC 11.7, INR: 2.1.  UA is consistent with UTI.  Chest x-ray negative for acute findings.  CT head is negative.  CT abdomen and pelvis showed mild left hydronephrosis with perinephric stranding due to obstructing 15 mm calculus in the left Vitro pelvic junction.  She met sepsis criteria therefore she was given 2 L of IV fluid bolus and started on cefepime.  Urology was consulted.  She tested negative for COVID-19.  Patient admitted under PCCM.  [facility] took over care from 01/23/2018.  Patient was treated for severe sepsis secondary to obstructive uropathy and complicated E. coli and Proteus UTI and E. coli/Enterobacter bacteremia. S/p ureteral stent placement by urology.  Patient appears to her baseline, palliative care was also consulted.  Plan is to go to SNF and outpatient palliative/hospice care.  Waiting for placement now.  Subjective: Patient was resting comfortably when seen today.  No new complaint.  Waiting for SNF placement.  Able to take some p.o. intake.  Assessment & Plan:   Active Problems:   Acute renal failure superimposed on chronic kidney disease ([place])   Septic shock (HCC)   Dementia without behavioral disturbance ([place])   Delirium   Ureterolithiasis   Palliative care by specialist   Goals of care, counseling/discussion  Severe sepsis in the  setting of obstructive uropathy/complicated Ecoli & Proteus UTI, E. coli/Enterobacterales bacteremia: -Status post left ureteral stent placement by urology.  POD 7. She was initially managed in ICU and received broad-spectrum antibiotics which were narrow down to cefazolin and now on Keflex for 6 more days. Clinically improving with improvement in leukocytosis and procalcitonin.  Remained afebrile. She will need 14 days of antibiotics and last day of therapy would be on 02/06/2020. -Palliative consult at the request of niece-final plan is to discharge to rehab and outpatient follow-up with palliative care and hospice. -Continue with antibiotics.  AKI with CKD stage IV.  Baseline creatinine around 1.9.  Slowly improving, on admission it was 3.87, improved to 1.77, at baseline now. Nephrology was consulted and she is not a candidate for long-term dialysis. -Continue to monitor. -Avoid nephrotoxins  Acute metabolic encephalopathy.  Resolved, appears to be at her baseline.  Most likely with sepsis.  Per niece she did had some underlying dementia. CT head shows no acute changes.  Advanced senescent  changes and borderline ventricular enlargement. B12, TSH and folate are within normal limit.  Ammonia within normal limit. -Palliative care consult.-Plan is to follow-up at SNF. -PT is recommending SNF placement. -TOC to look for placement  Elevated troponin: -Troponin 214 trended up to 764-819.  EKG: No acute ST-T wave changes noted.  Likely in the setting of demand ischemia in the setting of sepsis -Continue to monitor.  Patient denies any ACS symptoms.  Jerking movements: -Unknown etiology?.  Improving as per RN & the family.  Reviewed meds with pharmacy.  Vitals remained stable.  Could be secondary to pain/discomfort due to purewick.  Continue oxycodone and Ativan as needed. -EEG: No seizure, diffuse encephalopathy  Hypertension.  Blood pressure mildly elevated. -Continue Isordil.  History  of PE and DVT: Continue Coumadin as per pharmacy. - Monitor PT/INR  Hyperlipidemia: Continue atorvastatin  Alzheimer's dementia: Continue donepezil  Hypernatremia.  Most likely secondary to poor p.o. intake.  Resolved with IV fluid.  Thrombocytopenia: Resolved.  No signs of active bleeding.   Objective: Vitals:   02/03/20 0810 02/03/20 1528 02/04/20 0500 02/04/20 0512  BP: (!) 155/90 (!) 155/98  (!) 145/60  Pulse: 74 75  71  Resp: _0 Temp: 97.9 F (36.6 C) 97.8 F (36.6 C)  99.1 F (37.3 C)  TempSrc:    Axillary  SpO2: 100% 100%  100%  Weight:   60.8 kg   Height:        Intake/Output Summary (Last 24 hours) at 02/04/2020 0752 Last data filed at 02/03/2020 1849 Gross per 24 hour  Intake 1290.57 ml  Output --  Net 1290.57 ml   Filed Weights   02/01/20 0023 02/01/20 0500 02/04/20 0500  Weight: 62.1 kg 62.1 kg 60.8 kg    Examination:  General.  Frail elderly lady, in no acute distress. Pulmonary.  Lungs clear bilaterally, normal respiratory effort. CV.  Regular rate and rhythm, no JVD, rub or murmur. Abdomen.  Soft, nontender, nondistended, BS positive. CNS.  Alert and oriented x3.  No focal neurologic deficit. Extremities.  No edema, no cyanosis, pulses intact and symmetrical. Psychiatry.  Judgment and insight appears normal.  DVT prophylaxis: Coumadin Code Status: DNR Family Communication: Niece was updated on phone. Disposition Plan:  Status is: Inpatient  Remains inpatient appropriate because:Inpatient level of care appropriate due to severity of illness   Dispo: The patient is from: Home              Anticipated d/c is to: SNF              Anticipated d/c date is: 1-2 days.              Patient currently is medically stable.  Waiting for SNF availability. High risk for deterioration and death.  Consultants:   PCCM  ID  Nephrology  urology  Procedures:   Left ureteral stent placement  Antimicrobials: Cefazolin  Data Reviewed: I  have personally reviewed following labs and imaging studies  CBC: Recent Labs  Lab 01/30/20 0241  WBC 7.2  HGB 10.8*  HCT 32.0*  MCV 91.2  PLT 503   Basic Metabolic Panel: Recent Labs  Lab 01/29/20 0454 01/30/20 0241 02/02/20 0727  NA 147* 143 143  K 4.0 3.7 4.6  CL 113* 108 108  CO2 _1 GLUCOSE 104* 112* 116*  BUN 33* 30* 33*  CREATININE 2.03* 2.02* 1.77*  CALCIUM 9.1 9.0 9.5  MG  --  1.8  --    GFR: Estimated Creatinine Clearance: 19 mL/min (A) (by C-G formula based on SCr of 1.77 mg/dL (H)). Liver Function Tests: Recent Labs  Lab 01/30/20 0241  AST 40  ALT 18  ALKPHOS 53  BILITOT 0.6  PROT 6.5  ALBUMIN 2.6*   No results for input(s): LIPASE, AMYLASE in the last 168 hours. Recent Labs  Lab 01/29/20 1155  AMMONIA 18   Coagulation Profile: Recent Labs  Lab 01/30/20 0241 01/31/20 0121 02/01/20 0111 02/02/20 0727 02/03/20 1159  INR 1.8* 1.6* 1.4* 1.3* 1.5*   Cardiac Enzymes: No results for input(s): CKTOTAL, CKMB, CKMBINDEX, TROPONINI in the  last 168 hours. BNP (last 3 results) No results for input(s): PROBNP in the last 8760 hours. HbA1C: No results for input(s): HGBA1C in the last 72 hours. CBG: No results for input(s): GLUCAP in the last 168 hours. Lipid Profile: No results for input(s): CHOL, HDL, LDLCALC, TRIG, CHOLHDL, LDLDIRECT in the last 72 hours. Thyroid Function Tests: No results for input(s): TSH, T4TOTAL, FREET4, T3FREE, THYROIDAB in the last 72 hours. Anemia Panel: No results for input(s): VITAMINB12, FOLATE, FERRITIN, TIBC, IRON, RETICCTPCT in the last 72 hours. Sepsis Labs: No results for input(s): PROCALCITON, LATICACIDVEN in the last 168 hours.  No results found for this or any previous visit (from the past 240 hour(s)).   Radiology Studies: No results found.  Scheduled Meds: . atorvastatin  10 mg Oral Daily  . calcitRIOL  0.5 mcg Oral QHS  . cephALEXin  500 mg Oral Q8H  . Chlorhexidine Gluconate Cloth  6 each  Topical Q0600  . donepezil  5 mg Oral QHS  . feeding supplement  237 mL Oral TID BM  . isosorbide dinitrate  5 mg Oral Daily  . mirtazapine  15 mg Oral QHS  . multivitamin with minerals  1 tablet Oral Daily  . Warfarin - Pharmacist Dosing Inpatient   Does not apply q1600   Continuous Infusions: . sodium chloride 250 mL (01/23/20 1338)  . lactated ringers 50 mL/hr at 02/03/20 0507     LOS: 12 days   Time spent: 25 minutes  Lorella Nimrod, MD [facility]  If 7PM-7AM, please contact night-coverage [email or web address]  02/04/2020, 7:52 AM   This record has been created using Systems analyst. Errors have been sought and corrected,but may not always be located. Such creation errors do not reflect on the standard of care.

## 2020-02-04 NOTE — Progress Notes (Signed)
Occupational Therapy Treatment Patient Details Name: Hailey Lopez MRN: [id number] DOB: [birth year] Today's Date: 02/04/2020    History of present illness Per MD notes: Pt is an 87 year old female with past medical history of A. fib-on Coumadin, CKD stage IV, hypertension, hyperlipidemia, CVA, Alzheimer's dementia presents to emergency department with vomiting, generalized weakness, one episode of diarrhea and syncope.  MD assessment includes: Severe sepsis in the setting of obstructive uropathy/complicated Ecoli UTI, E. coli/Enterobacterales bacteremia, AKI on CKD stage IV, Elevated troponin likely demand ischemia, jerking movements of unkown etiology, HTN, hyponatremia, thrombocytopenia, Left proximal ureteral calculus with obstruction s/p cystoscopy and left ureteral stent placement, and debility.   OT comments  Hailey Lopez presents today considerably more debilitated than during her OT session 4 days ago. During that session, she engaged in bed mobility, transfers, and grooming with Min-Mod A, as well as with some level of engagement and enthusiasm. Today, she required Max-Total A for all activities, made no responses to attempts at communication, did not engage in eye contact. She was unable to communicate verbally if she had any pain, but she did not display any signs of discomfort. Transfer to SNF or hospice facility remains an appropriate D/C plan. Recommend evaluating pt's level of engagement during next OT session, to determine if hospital-based rehab services remain appropriate or should be discontinued.   Follow Up Recommendations  SNF;Other (comment) (or facility-based hospice)    Equipment Recommendations       Recommendations for Other Services      Precautions / Restrictions Precautions Precautions: Fall Restrictions Weight Bearing Restrictions: No       Mobility Bed Mobility Overal bed mobility: Needs Assistance Bed Mobility: Rolling Rolling: Max assist             Transfers Overall transfer level: Needs assistance               General transfer comment: transfer not attempted today, for safety reasons    Balance                                           ADL either performed or assessed with clinical judgement   ADL Overall ADL's : Needs assistance/impaired     Grooming: Total assistance Grooming Details (indicate cue type and reason): pt unable to assist with grooming tasks, recoiled from hand-over-hand attempt                               General ADL Comments: Total A w/ hand over hand bed level grooming, UB bathing.     Vision   Additional Comments: unable to assess 2/2 pt's low level of responsiveness   Perception     Praxis      Cognition Arousal/Alertness: Lethargic Behavior During Therapy: Flat affect Overall Cognitive Status: History of cognitive impairments - at baseline                                 General Comments: unable to follow simple, one-step directions this day        Exercises Other Exercises Other Exercises: Bed mobility, grooming, UB bathing attempted, pt requiring MaxA for all   Shoulder Instructions       General Comments      Pertinent Vitals/ Pain  Pain Assessment: No/denies pain  Home Living                                          Prior Functioning/Environment              Frequency  Min 1X/week        Progress Toward Goals  OT Goals(current goals can now be found in the care plan section)  Progress towards OT goals: Not progressing toward goals - comment;OT to reassess next treatment (Pt not alert enough today to participate meaningfully in therapy. Re-evaluate during next OT session. If pt remains disengaged, then recommend discontinuing enrollment in rehab service.)  Acute Rehab OT Goals Patient Stated Goal: to be comfortable OT Goal Formulation: With patient/family Time For Goal Achievement:  02/12/20 Potential to Achieve Goals: Good  Plan Discharge plan remains appropriate    Co-evaluation                 AM-PAC OT "6 Clicks" Daily Activity     Outcome Measure   Help from another person eating meals?: A Lot Help from another person taking care of personal grooming?: Total Help from another person toileting, which includes using toliet, bedpan, or urinal?: Total Help from another person bathing (including washing, rinsing, drying)?: Total Help from another person to put on and taking off regular upper body clothing?: Total Help from another person to put on and taking off regular lower body clothing?: Total 6 Click Score: 7    End of Session    OT Visit Diagnosis: Other abnormalities of gait and mobility (R26.89);Muscle weakness (generalized) (M62.81);Feeding difficulties (R63.3);History of falling (Z91.81);Unsteadiness on feet (R26.81)   Activity Tolerance Patient limited by lethargy;Other (comment) (Pt limited by decreased cognition)   Patient Left in bed;with bed alarm set;with call bell/phone within reach   Nurse Communication Mobility status        Time: 2197-5883 OT Time Calculation (min): 9 min  Charges: OT General Charges $OT Visit: 1 Visit OT Treatments $Self Care/Home Management : 8-22 mins  Josiah Lobo, PhD, [place], OTR/L ascom [phone] 02/04/20, 12:00 PM

## 2020-02-04 NOTE — Progress Notes (Signed)
No changes noted from previous assessment. Pt has poor appetite, update given to [name] (niece) this shift.

## 2020-02-05 LAB — PROTIME-INR
INR: 1.8 — ABNORMAL HIGH (ref 0.8–1.2)
Prothrombin Time: 20.3 seconds — ABNORMAL HIGH (ref 11.4–15.2)

## 2020-02-05 MED ORDER — MORPHINE SULFATE (CONCENTRATE) 10 MG/0.5ML PO SOLN: 5.0000 mg | ORAL | Status: DC | PRN

## 2020-02-05 MED ORDER — WARFARIN SODIUM 5 MG PO TABS
5.0000 mg | ORAL_TABLET | Freq: Once | ORAL | Status: DC
  Filled 2020-02-05: qty 1

## 2020-02-05 MED ORDER — ACETAMINOPHEN 325 MG PO TABS: 650.0000 mg | ORAL_TABLET | ORAL | Status: DC | PRN

## 2020-02-05 NOTE — Progress Notes (Signed)
[facility] for warfarin Indication: atrial fibrillation   Labs: Recent Labs    02/03/20 1159 02/04/20 0812  HGB  --  10.6*  HCT  --  31.0*  PLT  --  453*  LABPROT 17.1* 19.7*  INR 1.5* 1.7*    Estimated Creatinine Clearance: 19 mL/min (A) (by C-G formula based on SCr of 1.77 mg/dL (H)).   Medical History: Past Medical History:  Diagnosis Date  . Alzheimer's disease ([place])   . Atrial fibrillation ([place])   . Chronic kidney disease    Stage 4  . CVA (cerebrovascular accident) ([place])   . DVT (deep venous thrombosis) ([place]) 2013   LLE  . Hyperlipidemia   . Hypertension   . Pacemaker   . Pulmonary embolism [facility])     Assessment: 87 year old female presented with vomiting and diarrhea. Found to have left proximal ureteral calculus with obstruction, now s/p left ureteral stent placement. Blood cultures with 4/4 bottles E.coli, likely urinary source. Patient with h/o afib on warfarin PTA. Last known home dose warfarin 3 mg daily. INR therapeutic on admission.  Drug-drug interactions:cephalexin   Albumin: 3.3 >> 2.4 >> 2.2 >> 2.4  Date INR Dose 1/4 2.1 3 mg (patient reported PTA) 1/5 -- Held 1/6 3.0 Held 1/7 2.6 3 mg 1/8 2.6 3 mg 1/9 2.9 2 mg 1/10     3.0 DOSE HELD 1/11 2.3       2 mg 1/12     1.8       2.5 mg 1/13     1.6 '3mg'$  1/14 1.4 4 mg 1/15  1.3 4 mg 1/16 1.5 4 mg 1/17 1.7 4 mg 1/18 1.8 5 mg  Goal of Therapy:  INR 2-3 Monitor platelets by anticoagulation protocol: Yes   Plan:  --INR is subtherapeutic today trending up. Will order warfarin 5 mg x 1 dose today, if INR continues to trend up consider decreasing tomorrow's dose back 3-4 mg. Daily INR ordered. CBC at least every 3 days.   Oswald Hillock, PharmD, BCPS Clinical Pharmacist 02/05/2020 8:20 AM

## 2020-02-05 NOTE — TOC Progression Note (Addendum)
Transition of Care [facility]) - Progression Note    Patient Details  Name: Hailey Lopez MRN: [id number] Date of Birth: 08/23/1929  Transition of Care [facility]) CM/SW [place], LCSW Phone Number: 02/05/2020, 9:23 AM  Clinical Narrative:     Call to niece, Hailey Lopez rescinded bed offer. CSW resent information yesterday to other SNFs with pending bed offers. Informed her of 2 bed offers at this time are [facility] and [facility]. She reported she is going to research these options and call CSW back. She reported she was told patient is not eating or drinking and is wondering if patient needs hospice instead of SNF rehab. CSW updated Palliative NPs, MD and LPN.  10:05- Palliative NP spoke with patient's niece and recommending hospice home. CSW asked Hailey Lopez with [facility] to review patient for hospice home eligibility.  1:55- Checked with Emergency planning/management officer. Patient approved for Hospice Home. No beds available at this time. Updated niece Hailey Lopez via phone.  Expected Discharge Plan: Skilled Nursing Facility Barriers to Discharge: Continued Medical Work up  Expected Discharge Plan and Services Expected Discharge Plan: [place] arrangements for the past 2 months: Single Family Home                                       Social Determinants of Health (SDOH) Interventions    Readmission Risk Interventions Readmission Risk Prevention Plan 01/29/2020  Transportation Screening Complete  PCP or Specialist Appt within 5-7 Days Complete  Home Care Screening Complete  Medication Review (RN CM) Complete

## 2020-02-05 NOTE — Progress Notes (Signed)
PROGRESS NOTE    Hailey Lopez  MRN:[id number] DOB: 08/29/1929 DOA: 01/22/2020 PCP: Wharton, Courtney, PA-C   Brief Narrative: Taken from prior notes. Patient is 87-year-old female with past medical history of A. fib-on Coumadin, CKD stage IV, hypertension, hyperlipidemia, CVA, Alzheimer's dementia presents to emergency department with vomiting, generalized weakness, one episode of diarrhea and syncope.  ED course: Patient afebrile, v BP: 145/108, pulse: 136, EKG shows sinus tachycardia and nonspecific ST-T wave changes.  Sodium 146, bicarb 20, anion gap 16, BUN 44, creatinine: 3.45, lactic acid 8.1, procalcitonin 80.51, WBC 11.7, INR: 2.1.  UA is consistent with UTI.  Chest x-ray negative for acute findings.  CT head is negative.  CT abdomen and pelvis showed mild left hydronephrosis with perinephric stranding due to obstructing 15 mm calculus in the left Vitro pelvic junction.  She met sepsis criteria therefore she was given 2 L of IV fluid bolus and started on cefepime.  Urology was consulted.  She tested negative for COVID-19.  Patient admitted under PCCM.  [facility] hospital[facility] took over care from 01/23/2018.  Patient was treated for severe sepsis secondary to obstructive uropathy and complicated E. coli and Proteus UTI and E. coli/Enterobacter bacteremia. S/p ureteral stent placement by urology.  Palliative care was consulted initially due to poor p.o. intake, patient started taking some Ensure and appears little improved so initial plan was to send her to SNF with outpatient palliative/hospice. Patient was still waiting for bed placement, stop taking p.o. for the past day or so and refusing all medications.  Needs met with palliative care again today and decided to proceed with comfort care only and hospice home. [facility]-pending their evaluation and bed availability.  Subjective: Patient again appears very withdrawn today.  Not talking.  She did not took any p.o. since  yesterday. Neice met with palliative again today and decided to proceed with comfort care only.  Assessment & Plan:   Active Problems:   Acute renal failure superimposed on chronic kidney disease (HCC)   Septic shock (HCC)   Dementia without behavioral disturbance (HCC)   Delirium   Ureterolithiasis   Palliative care by specialist   Goals of care, counseling/discussion  Severe sepsis in the setting of obstructive uropathy/complicated Ecoli & Proteus UTI, E. coli/Enterobacterales bacteremia: -Status post left ureteral stent placement by urology.  POD 7. She was initially managed in ICU and received broad-spectrum antibiotics which were narrow down to cefazolin and now on Keflex for 6 more days. Clinically improving with improvement in leukocytosis and procalcitonin.  Remained afebrile. She will need 14 days of antibiotics and last day of therapy would be on 02/06/2020. -Palliative consult at the request of niece-initial plan was to discharge to rehab and outpatient follow-up with palliative care and hospice. -Plan changed to proceed with comfort care only and patient will be going to hospice home instead of SNF.  AKI with CKD stage IV.  Baseline creatinine around 1.9.  Slowly improving, on admission it was 3.87, improved to 1.77, at baseline now. Nephrology was consulted and she is not a candidate for long-term dialysis. -Avoid nephrotoxins  Acute metabolic encephalopathy.  Resolved, appears to be at her baseline.  Most likely with sepsis.  Per niece she did had some underlying dementia. CT head shows no acute changes.  Advanced senescent  changes and borderline ventricular enlargement. B12, TSH and folate are within normal limit.  Ammonia within normal limit. -Palliative care consult.-Plan is to follow-up at SNF. -PT is recommending SNF   placement, still no bed availability, now she was transitioned to comfort measures only and will be going to hospice home.  Elevated  troponin: -Troponin 214 trended up to 764-819.  EKG: No acute ST-T wave changes noted.  Likely in the setting of demand ischemia in the setting of sepsis -Continue to monitor.  Patient denies any ACS symptoms.  Jerking movements: -Unknown etiology?.  Improving as per RN & the family.  Reviewed meds with pharmacy.  Vitals remained stable.  Could be secondary to pain/discomfort due to purewick.  Continue oxycodone and Ativan as needed. -EEG: No seizure, diffuse encephalopathy  Hypertension.  Blood pressure mildly elevated. -Continue Isordil.  History of PE and DVT: Continue Coumadin as per pharmacy. - Monitor PT/INR  Hyperlipidemia: Continue atorvastatin  Alzheimer's dementia: Continue donepezil  Hypernatremia.  Most likely secondary to poor p.o. intake.  Resolved with IV fluid.  Thrombocytopenia: Resolved.  No signs of active bleeding.   Objective: Vitals:   02/04/20 1548 02/05/20 0007 02/05/20 0813 02/05/20 1159  BP: 123/63 (!) 143/71 (!) 152/60 140/80  Pulse: 75 70 63 73  Resp: 16 15 16 16  Temp: 98.8 F (37.1 C) 98.8 F (37.1 C) 98.5 F (36.9 C) 98.4 F (36.9 C)  TempSrc: Oral     SpO2: 100% 99% 97% 100%  Weight:      Height:        Intake/Output Summary (Last 24 hours) at 02/05/2020 1229 Last data filed at 02/05/2020 1014 Gross per 24 hour  Intake 480 ml  Output --  Net 480 ml   Filed Weights   02/01/20 0023 02/01/20 0500 02/04/20 0500  Weight: 62.1 kg 62.1 kg 60.8 kg    Examination:  General.  Frail elderly lady, awake but not responding to any commands, in no acute distress. Pulmonary.  Lungs clear bilaterally, normal respiratory effort. CV.  Regular rate and rhythm, no JVD, rub or murmur. Abdomen.  Soft, nontender, nondistended, BS positive. CNS.  Alert but not responding to commands. Extremities.  No edema, no cyanosis, pulses intact and symmetrical. Psychiatry.  Judgment and insight appears impaired.  DVT prophylaxis: Coumadin Code Status:  DNR Family Communication: Niece was updated on phone. Disposition Plan:  Status is: Inpatient  Remains inpatient appropriate because:Inpatient level of care appropriate due to severity of illness   Dispo: The patient is from: Home              Anticipated d/c is to: SNF              Anticipated d/c date is: 1-2 days.              Patient currently is medically stable.   High risk for deterioration and death. Patient transition to full comfort measures only-awaiting bed availability at hospice home.  Consultants:   PCCM  ID  Nephrology  urology  Procedures:   Left ureteral stent placement  Antimicrobials: Cefazolin  Data Reviewed: I have personally reviewed following labs and imaging studies  CBC: Recent Labs  Lab 01/30/20 0241 02/04/20 0812  WBC 7.2 7.9  HGB 10.8* 10.6*  HCT 32.0* 31.0*  MCV 91.2 89.3  PLT 316 453*   Basic Metabolic Panel: Recent Labs  Lab 01/30/20 0241 02/02/20 0727  NA 143 143  K 3.7 4.6  CL 108 108  CO2 27 26  GLUCOSE 112* 116*  BUN 30* 33*  CREATININE 2.02* 1.77*  CALCIUM 9.0 9.5  MG 1.8  --    GFR: Estimated Creatinine Clearance: 19 mL/min (  A) (by C-G formula based on SCr of 1.77 mg/dL (H)). Liver Function Tests: Recent Labs  Lab 01/30/20 0241  AST 40  ALT 18  ALKPHOS 53  BILITOT 0.6  PROT 6.5  ALBUMIN 2.6*   No results for input(s): LIPASE, AMYLASE in the last 168 hours. No results for input(s): AMMONIA in the last 168 hours. Coagulation Profile: Recent Labs  Lab 02/01/20 0111 02/02/20 0727 02/03/20 1159 02/04/20 0812 02/05/20 0740  INR 1.4* 1.3* 1.5* 1.7* 1.8*   Cardiac Enzymes: No results for input(s): CKTOTAL, CKMB, CKMBINDEX, TROPONINI in the last 168 hours. BNP (last 3 results) No results for input(s): PROBNP in the last 8760 hours. HbA1C: No results for input(s): HGBA1C in the last 72 hours. CBG: No results for input(s): GLUCAP in the last 168 hours. Lipid Profile: No results for input(s):  CHOL, HDL, LDLCALC, TRIG, CHOLHDL, LDLDIRECT in the last 72 hours. Thyroid Function Tests: No results for input(s): TSH, T4TOTAL, FREET4, T3FREE, THYROIDAB in the last 72 hours. Anemia Panel: No results for input(s): VITAMINB12, FOLATE, FERRITIN, TIBC, IRON, RETICCTPCT in the last 72 hours. Sepsis Labs: No results for input(s): PROCALCITON, LATICACIDVEN in the last 168 hours.  No results found for this or any previous visit (from the past 240 hour(s)).   Radiology Studies: No results found.  Scheduled Meds: . cephALEXin  500 mg Oral Q8H  . feeding supplement  237 mL Oral TID BM  . isosorbide dinitrate  5 mg Oral Daily   Continuous Infusions: . sodium chloride 250 mL (01/23/20 1338)     LOS: 13 days   Time spent: 25 minutes   , MD [facility]  If 7PM-7AM, please contact night-coverage [email or web address]  02/05/2020, 12:29 PM   This record has been created using Dragon voice recognition software. Errors have been sought and corrected,but may not always be located. Such creation errors do not reflect on the standard of care.  

## 2020-02-05 NOTE — Plan of Care (Signed)

## 2020-02-05 NOTE — Progress Notes (Signed)
Daily Progress Note   Patient Name: Hailey Lopez       Date: 02/05/2020 DOB: 1929/01/24  Age: 87 y.o. MRN#: [id number] Attending Physician: Hailey Nimrod, MD Primary Care Physician: Hailey Stalker, PA-C Admit Date: 01/22/2020  Reason for Consultation/Follow-up: Establishing goals of care  Subjective: This AM, patient lethargic. Will open eyes to voice but does not engage in conversation or follow commands. Appears comfortable. Remains on IVF.  GOC:  This AM, no family at bedside. Spoke with niece, Hailey Lopez. [name]'s oral intake remains poor. She is sleeping majority of the day. She has remained comfortable without s/s of discomfort. Hailey Lopez and I spoke last week about primary goals for [name] are for comfort and peace as her dementia progresses. Hailey Lopez does not wish for life prolonging measures such as feeding tube.   Discussed concern that because of her progressive Alzheimer's dementia, it is unlikely she will thrive at SNF rehab. Discussed hospice options and philosophy. Hailey Lopez is interested in starting hospice services on discharge. She prefer discharge to hospice facility (as she does not want [name] to die in her home) but open to hospice services at home if she can receive medical equipment. Explained concern that with ongoing poor oral intake, [name] may decline quickly within weeks once IVF are discontinued. Hailey Lopez wishes to pursue hospice facility placement.   Answered questions and concerns. Explained process with SW involvement and hospice facility referral. Hailey Lopez has PMT contact information.   Length of Stay: 13  Current Medications: Scheduled Meds:  . cephALEXin  500 mg Oral Q8H  . feeding supplement  237 mL Oral TID BM  . isosorbide dinitrate  5 mg Oral Daily     Continuous Infusions: . sodium chloride 250 mL (01/23/20 1338)    PRN Meds: sodium chloride, acetaminophen, diphenoxylate-atropine, docusate sodium, hydrALAZINE, LORazepam, morphine CONCENTRATE, ondansetron (ZOFRAN) IV, polyethylene glycol, traZODone  Physical Exam Vitals and nursing note reviewed.  HENT:     Head: Normocephalic and atraumatic.  Cardiovascular:     Rate and Rhythm: Normal rate.  Pulmonary:     Effort: No tachypnea, accessory muscle usage or respiratory distress.  Skin:    General: Skin is warm and dry.  Neurological:     Comments: Lethargic, comfortable.  Psychiatric:        Attention and Perception: She  is inattentive.        Speech: Speech is delayed.        Cognition and Memory: Cognition is impaired.            Vital Signs: BP (!) 152/60 (BP Location: Right Arm)   Pulse 63   Temp 98.5 F (36.9 C)   Resp 16   Ht '5\' 4"'$  (1.626 m)   Wt 60.8 kg   SpO2 97%   BMI 23.01 kg/m  SpO2: SpO2: 97 % O2 Device: O2 Device: Room Air O2 Flow Rate: O2 Flow Rate (L/min): 2 L/min  Intake/output summary:   Intake/Output Summary (Last 24 hours) at 02/05/2020 1008 Last data filed at 02/04/2020 1900 Gross per 24 hour  Intake 240 ml  Output -  Net 240 ml   LBM: Last BM Date: 02/04/20 Baseline Weight: Weight: 63.5 kg Most recent weight: Weight: 60.8 kg       Palliative Assessment/Data: PPS 20%    Flowsheet Rows   Flowsheet Row Most Recent Value  Intake Tab   Referral Department Hospitalist  Unit at Time of Referral Med/Surg Unit  Palliative Care Primary Diagnosis Neurology  Palliative Care Type New Palliative care  Reason for referral Clarify Goals of Care  Date first seen by Palliative Care 01/31/20  Clinical Assessment   Palliative Performance Scale Score 20%  Psychosocial & Spiritual Assessment   Palliative Care Outcomes   Patient/Family meeting held? Yes  Who was at the meeting? niece  Palliative Care Outcomes Clarified goals of care, Counseled  regarding hospice, Provided end of life care assistance, Provided psychosocial or spiritual support, Changed to focus on comfort, ACP counseling assistance, Transitioned to hospice      Patient Active Problem List   Diagnosis Date Noted  . Palliative care by specialist   . Goals of care, counseling/discussion   . Dementia without behavioral disturbance ([place])   . Delirium   . Ureterolithiasis   . Septic shock ([place]) 01/23/2020  . Acute metabolic encephalopathy [id number]  . Lactic acidosis 12/24/2019  . Acute renal failure superimposed on chronic kidney disease ([place]) 12/24/2019  . UTI (urinary tract infection) 12/23/2019  . Long term (current) use of anticoagulants 07/31/2018  . SSS (sick sinus syndrome) ([place]) 11/25/2017  . Paroxysmal atrial fibrillation ([place]) 11/25/2017  . Pacemaker 11/25/2017  . History of pulmonary embolism 11/25/2017  . CKD (chronic kidney disease) stage 4, GFR 15-29 ml/min (HCC) 11/25/2017  . Essential hypertension 11/25/2017  . Hypercholesterolemia 11/25/2017    Palliative Care Assessment & Plan   Patient Profile: 87 y.o. female  with past medical history of Alzheimer's dementia, pacemaker, HTN, HLD, DVT, CVA, afib on Coumadin, CKD stage IV admitted on 01/22/2020 with diarrhea and syncope. Found to have severe sepsis secondary to obstructive uropathy and complicated E. Coli/Proteus UTI and E.coli/enterobacter bacteremia. Taken urgently for cystoscopy s/p stent placement. Patient requires 14 days of antibiotics, last day 02/06/20. Baseline Alzheimer's with poor cognitive, functional, and nutritional status this admission. Patient refusing oral intake x2 days and withdrawn. CT head negative for acute changes. Palliative medicine consultation for goals of care.   Assessment: Severe sepsis resolved Obstructive uropathy Complicated E.coli and Proteus UTI E.coli/Enterobacterales bacteremia AKI with CKD stage IV Alzheimer's dementia Acute metabolic  encephalopathy Elevated troponin, likely demand ischemia Hx of PE/DVT Hypernatremia Thrombocytopenia  Recommendations/Plan:  Patient continues to decline functionally, nutritionally and cognitively. Very poor oral intake. Only will accept few bites for niece.   MOST form completed on 02/01/20. Decisions include: DNR/DNI, comfort  focused care plan if further decline, IVF/ABX for time trial, and NO feeding tube. Electronic MOST completed. Durable DNR completed.   F/u GOC with niece/caregiver Hailey Lopez). Goals are aimed at comfort and peace as her Aunt's condition declines. Hailey Lopez wishes to pursue hospice facility placement. TOC team notified.   Comfort feeds per patient/family request.  Code Status: DNR/DNI   Code Status Orders  (From admission, onward)         Start     Ordered   01/23/20 1336  Do not attempt resuscitation (DNR)  Continuous       Question Answer Comment  In the event of cardiac or respiratory ARREST Do not call a "code blue"   In the event of cardiac or respiratory ARREST Do not perform Intubation, CPR, defibrillation or ACLS   In the event of cardiac or respiratory ARREST Use medication by any route, position, wound care, and other measures to relive pain and suffering. May use oxygen, suction and manual treatment of airway obstruction as needed for comfort.      01/23/20 1335        Code Status History    Date Active Date Inactive Code Status Order ID Comments User Context   01/23/2020 0204 01/23/2020 1335 Full Code [id number]  Bradly Bienenstock, NP ED   12/23/2019 2013 12/28/2019 0032 Full Code [id number]  Mansy, Arvella Merles, MD ED   Advance Care Planning Activity       Prognosis:  Likely weeks with progressive Alzheimer's dementia, significant functional and nutritional status decline. Very poor oral intake and anticipate decline now that IVF are discontinued.   Discharge Planning:  Pending residential hospice facility vs home with hospice  Care plan was  discussed with niece Hailey Lopez), RN, Dr. Reesa Chew, [place], hospice liaison  Thank you for allowing the Palliative Medicine Team to assist in the care of this patient.   Total Time 30 Prolonged Time Billed no    Greater than 50% of this time was spent counseling and coordinating care related to the above assessment and plan.   Ihor Dow, DNP, FNP-C Palliative Medicine Team  Phone: [phone] Fax: [phone]  Please contact Palliative Medicine Team phone at [phone] for questions and concerns.

## 2020-02-05 NOTE — Progress Notes (Signed)
[facility]) Hospital Liaison RN note:  Received request from Ihor Dow, NP for family interest in [facility]. Chart reviewed and eligibility has been approved. Spoke with niece, [name] in the room to confirm interest and explain services. She verbalized understanding and all questions were answered. Registration paperwork will be completed by [name] when bed is available. There is not a bed available today. [facility] RN will follow for room availability. Hospital care team is aware of above information.  Please call with any hospice related questions or concerns.  Thank you for the opportunity to participate in this patient's care.  Zandra Abts, RN [facility] Liaison [phone]

## 2020-02-06 NOTE — TOC Transition Note (Signed)
Transition of Care [facility]) - CM/SW Discharge Note   Patient Details  Name: Hailey Lopez MRN: [id number] Date of Birth: 07-21-1929  Transition of Care [facility]) CM/SW Contact:  Magnus Ivan, LCSW Phone Number: 02/06/2020, 1:09 PM   Clinical Narrative:   Patient has a bed at [facility] in [place] today. [facility] pick up arranged for 4:30 pick up [facility] requested around 5 pm). Updated RN, MD, and niece [name]. EMS paperwork and DNR placed in [place].   Final next level of care: [place] Barriers to Discharge: Barriers Resolved   Patient Goals and CMS Choice Patient states their goals for this hospitalization and ongoing recovery are:: hospice home CMS Medicare.gov Compare Post Acute Care list provided to:: Patient Represenative (must comment) Choice offered to / list presented to : [facility] POA / Guardian  Discharge Placement                Patient to be transferred to facility by: [facility] Name of family member notified: [name] - niece Patient and family notified of of transfer: 02/06/20  Discharge Plan and Services                                     Social Determinants of Health ([place]) Interventions     Readmission Risk Interventions Readmission Risk Prevention Plan 01/29/2020  Transportation Screening Complete  PCP or Specialist Appt within 5-7 Days Complete  Home Care Screening Complete  Medication Review (RN CM) Complete

## 2020-02-06 NOTE — Discharge Summary (Signed)
[facility] Discharge Summary   Patient: Hailey Lopez FAO:[id number]  PCP: Marda Stalker, PA-C  Date of admission: 01/22/2020   Date of discharge:  02/06/2020     Discharge Diagnoses:  Principal diagnosis Severe sepsis due to obstructive uropathy E. coli and Proteus Active Problems:   Acute renal failure superimposed on chronic kidney disease ([place])   Septic shock ([place])   Dementia without behavioral disturbance ([place])   Delirium   Ureterolithiasis   Palliative care by specialist   Goals of care, counseling/discussion   Admitted From: Home Disposition:  Hospice facility  Recommendations for Outpatient Follow-up:  1. Hospice care    Diet recommendation: Regular diet  Activity: The patient is advised to gradually reintroduce usual activities, as tolerated  Discharge Condition: stable  Code Status: DNR   History of present illness: As per the H and P dictated on admission  Hospital Course:  Patient is 88 year old female with past medical history of A. fib-on Coumadin, CKD stage IV, hypertension, hyperlipidemia, CVA, Alzheimer's dementia presents to emergency department with vomiting, generalized weakness, one episode of diarrhea and syncope.  ED course: Patient afebrile, v BP: 145/108, pulse: 136, EKG shows sinus tachycardia and nonspecific ST-T wave changes. Sodium 146, bicarb 20, anion gap 16, BUN 44, creatinine: 3.45, lactic acid 8.1, procalcitonin 80.51, WBC 11.7, INR: 2.1. UA is consistent with UTI. Chest x-ray negative for acute findings. CT head is negative. CT abdomen and pelvis showed mild left hydronephrosis with perinephric stranding due to obstructing 15 mm calculus in the left Vitro pelvic junction. She met sepsis criteria therefore she was given 2 L of IV fluid bolus and started on cefepime. Urology was consulted. She tested negative for COVID-19. Patient admitted under PCCM. [facility] took over care from 01/23/2018.  Patient was treated for  severe sepsis secondary to obstructive uropathy and complicated E. coli and Proteus UTI and E. coli/Enterobacter bacteremia. S/p ureteral stent placement by urology.  Palliative care was consulted initially due to poor p.o. intake, patient started taking some Ensure and appears little improved so initial plan was to send her to SNF with outpatient palliative/hospice.  Bed is available today at the hospital facility and patient is being discharged.    Assessment & Plan: Active Problems:   Acute renal failure superimposed on chronic kidney disease ([place])   Septic shock ([facility])   Dementia without behavioral disturbance ([place])   Delirium   Ureterolithiasis   Palliative care by specialist   Goals of care, counseling/discussion  Severe sepsis in the setting of obstructive uropathy/complicated Ecoli& ProteusUTI, E. coli/Enterobacterales bacteremia: Status post left ureteral stent placement by urology. She was initially managed in ICU and received broad-spectrum antibiotics which were narrow down to cefazolin and now on Keflex for 6 more days. Clinically improving with improvement in leukocytosis and procalcitonin.  Remained afebrile. S/p 14 days of antibiotics and last day of therapy on 02/06/2020. Palliative care consult at the request of niece-initial plan was to discharge to rehab and outpatient follow-up with palliative care and hospice. Plan changed to proceed with comfort care only and patient is being discharged to hospice home instead of SNF.  AKI with CKD stage IV.  Baseline creatinine around 1.9.  Slowly improving, on admission it was 3.87, improved to 1.77, at baseline now. Nephrology was consulted and she is not a candidate for long-term dialysis. Acute metabolic encephalopathy  Most likely with sepsis.  Per niece she did had some underlying dementia. CT head shows no acute changes. Advanced senescentchanges and borderline  ventricular enlargement.B12, TSH and folate are within normal  limit.  Ammonia within normal limit. Palliative care consult and patient is now under hospice care. Elevated troponin: Troponin 214 trended up to 764-819. EKG: No acute ST-T wave changes noted. Likely in the setting of demand ischemia in the setting of sepsis Jerking movements: Unknown etiology?. Improving as per RN &the family. Reviewed meds with pharmacy. Vitals remained stable. Could be secondary to pain/discomfort due to purewick. Continue oxycodone and Ativan as needed. -EEG: No seizure, diffuse encephalopathy Hypertension.  Blood pressure mildly elevated. -Continue Isordil. History of PE and DVT:s/p Coumadin  Hyperlipidemia: Continue atorvastatin Alzheimer's dementia:Continue donepezil Hypernatremia.  Most likely secondary to poor p.o. intake.  Resolved with IV fluid. Thrombocytopenia:Resolved. No signs of active bleeding.  Body mass index is 23.54 kg/m.  Nutrition Problem: Inadequate oral intake Etiology: decreased appetite Nutrition Interventions: Interventions: Ensure Enlive (each supplement provides 350kcal and 20 grams of protein),Magic cup  Overall patient's prognosis is very poor and deemed appropriate for hospice care.  Patient got bed available today at hospice facility so patient is being discharged.  Consultants: Urology, ID, Nephrology, palliative care,  Procedures: s/p Left ureteral stent placement by urology.   Discharge Exam: General: Appear in no distress, no Rash. Cardiovascular: S1 and S2 Present, no Murmur, Respiratory: normal respiratory effort, Bilateral Air entry present and no Crackles, no wheezes Abdomen: Bowel Sound present, Soft and no tenderness, no hernia Extremities: no Pedal edema, no calf tenderness Neurology: lethargic and not oriented to time, place, and person affect flat in affect.  Filed Weights   02/01/20 0500 02/04/20 0500 02/06/20 0422  Weight: 62.1 kg 60.8 kg 62.2 kg   Vitals:   02/06/20 0741 02/06/20 1152  BP: (!) 136/58  (!) 118/54  Pulse: (!) 59 73  Resp: 16 16  Temp: 98.6 F (37 C) 98.2 F (36.8 C)  SpO2: 99% 100%    DISCHARGE MEDICATION: Allergies as of 02/06/2020   No Known Allergies     Medication List    TAKE these medications   atorvastatin 10 MG tablet Commonly known as: LIPITOR Take 10 mg by mouth daily.   calcitRIOL 0.5 MCG capsule Commonly known as: ROCALTROL Take 0.5 mcg by mouth at bedtime.   donepezil 5 MG tablet Commonly known as: ARICEPT Take 5 mg by mouth at bedtime.   isosorbide dinitrate 5 MG tablet Commonly known as: ISORDIL Take 5 mg by mouth daily.   polyethylene glycol 17 g packet Commonly known as: MIRALAX / GLYCOLAX Take 17 g by mouth daily as needed for mild constipation or moderate constipation.   warfarin 3 MG tablet Commonly known as: COUMADIN Take as directed. If you are unsure how to take this medication, talk to your nurse or doctor. Original instructions: Take 1 tablet by mouth once daily            Discharge Care Instructions  (From admission, onward)         Start     Ordered   02/06/20 0000  Discharge wound care:       Comments: Wet-to-dry dressing   02/06/20 1216         No Known Allergies Discharge Instructions    Diet - low sodium heart healthy   Complete by: As directed    Discharge instructions   Complete by: As directed    Follow hospice care   Discharge wound care:   Complete by: As directed    Wet-to-dry dressing   Increase activity slowly  Complete by: As directed       The results of significant diagnostics from this hospitalization (including imaging, microbiology, ancillary and laboratory) are listed below for reference.    Significant Diagnostic Studies: EEG  Result Date: 01/28/2020 Lora Havens, MD     01/28/2020  2:32 PM Patient Name: Hailey Lopez MRN: [id number] Epilepsy Attending: Lora Havens Referring Physician/Provider: Dr Early Osmond Date: 01/28/2020 Duration: 22.49 mins Patient history:  87yo F with ams and jerking movements. EEG to evaluate for seizure Level of alertness: Awake AEDs during EEG study: None Technical aspects: This EEG study was done with scalp electrodes positioned according to the 10-20 International system of electrode placement. Electrical activity was acquired at a sampling rate of '500Hz'  and reviewed with a high frequency filter of '70Hz'  and a low frequency filter of '1Hz' . EEG data were recorded continuously and digitally stored. Description: No posterior dominant rhythm was seen. EEG showed continuous generalized 3 to 6 Hz theta-delta slowing. Physiologic photic driving was not seen during photic stimulation. Hyperventilation was not performed.   Of note, parts of study were technically difficult due to electrode and movement artifact. ABNORMALITY -Continuous slow, generalized IMPRESSION: This technically difficult study is suggestive of moderate diffuse encephalopathy, nonspecific etiology. No seizures or epileptiform discharges were seen throughout the recording. Priyanka Barbra Sarks   CT ABDOMEN PELVIS WO CONTRAST  Result Date: 01/23/2020 CLINICAL DATA:  Bowel obstruction, vomiting, diarrhea EXAM: CT ABDOMEN AND PELVIS WITHOUT CONTRAST TECHNIQUE: Multidetector CT imaging of the abdomen and pelvis was performed following the standard protocol without IV contrast. COMPARISON:  10/07/2017 FINDINGS: Lower chest: There is left-sided volume loss again identified with mediastinal shift to the left. Mild global cardiomegaly. Pacemaker leads are seen within the right atrium and within the right ventricle embedded within the intraventricular septum. No pericardial effusion. Hepatobiliary: No focal liver abnormality is seen. No gallstones, gallbladder wall thickening, or biliary dilatation. Pancreas: Unremarkable Spleen: Unremarkable Adrenals/Urinary Tract: There has developed mild left hydronephrosis and perinephric stranding secondary to an obstructing 8 mm x 15 mm x 15 mm calculus  within the left ureteropelvic junction. An additional 10 mm staghorn calculus is seen within the terminal lower pole calyx. Stable exophytic simple cortical cyst noted within the left kidney. The right kidney is unremarkable; no intrarenal or ureteral calculi are noted on the right and there is no hydronephrosis on the right. The bladder is unremarkable. Stomach/Bowel: Moderate sigmoid diverticulosis. Mild diverticulosis of the cecum also noted. The stomach, small bowel, and large bowel are otherwise unremarkable. No evidence of obstruction or focal inflammation. The appendix is absent. Vascular/Lymphatic: Extensive aortoiliac atherosclerotic calcification. Extensive calcification noted within the mid and distal superior mesenteric artery. No aortic aneurysm. Inferior vena cava filter in expected position within the infrarenal cava. No pathologic adenopathy within the abdomen and pelvis. Reproductive: Large involuted densely calcified fibroid noted within the uterine fundus. The endometrium is thickened, abnormal in a patient of this age in absence of hormonal stimulation. No adnexal masses are seen. Other: Small fat containing umbilical hernia. Musculoskeletal: Degenerative changes are seen within the lumbar spine. No lytic or blastic bone lesion is seen. IMPRESSION: Interval development of a mild left hydronephrosis and perinephric stranding secondary to an obstructing 15 mm calculus within the left ureteropelvic junction. Superimposed nonobstructing lower pole nephrolithiasis. Peripheral vascular disease with extensive calcification within the a mid superior mesenteric artery. Patency of this vessel is not well characterized on this examination. Correlation for signs and symptoms of chronic mesenteric ischemia may  be helpful. Thickening of the endometrium, abnormal in a patient of this age in absence of hormonal stimulation. Dedicated sonography and possible endometrial biopsy may be helpful for further  management. Aortic Atherosclerosis (ICD10-I70.0). Electronically Signed   By: Fidela Salisbury MD   On: 01/23/2020 01:14   DG Chest 2 View  Result Date: 01/22/2020 CLINICAL DATA:  Weakness. EXAM: CHEST - 2 VIEW COMPARISON:  December 23, 2019 FINDINGS: The heart size and mediastinal contours are within normal limits. Both lungs are clear. The visualized skeletal structures are unremarkable. There is a well-positioned dual chamber left-sided pacemaker in place. IMPRESSION: No active cardiopulmonary disease. Electronically Signed   By: Constance Holster M.D.   On: 01/22/2020 21:52   CT Head Wo Contrast  Result Date: 01/23/2020 CLINICAL DATA:  Recurrent syncope, vomiting, diarrhea EXAM: CT HEAD WITHOUT CONTRAST TECHNIQUE: Contiguous axial images were obtained from the base of the skull through the vertex without intravenous contrast. COMPARISON:  09/19/2018 FINDINGS: Brain: Normal anatomic configuration. Parenchymal volume loss is commensurate with the patient's age. Moderate subcortical and periventricular white matter changes are present likely reflecting the sequela of small vessel ischemia. Remote lacunar infarct noted within the anterior limb of the right internal capsule. No abnormal intra or extra-axial mass lesion or fluid collection. No abnormal mass effect or midline shift. No evidence of acute intracranial hemorrhage or infarct. Borderline enlargement of the lateral ventricles likely reflects ex vacuo dilation secondary to central atrophy. Cerebellum unremarkable. Vascular: No asymmetric hyperdense vasculature at the skull base. Extensive vascular calcifications are noted within the carotid siphons bilaterally. Skull: Intact Sinuses/Orbits: Paranasal sinuses are clear. Orbits are unremarkable. Other: There is opacification of several inferior left mastoid air cells without associated cortical erosion. Remaining mastoid air cells and middle ear cavities bilaterally are clear. IMPRESSION: No acute  intracranial hemorrhage or infarct. Advanced senescent changes. Borderline ventricular enlargement. Electronically Signed   By: Fidela Salisbury MD   On: 01/23/2020 01:02   DG OR UROLOGY CYSTO IMAGE ([place])  Result Date: 01/23/2020 There is no interpretation for this exam.  This order is for images obtained during a surgical procedure.  Please See "Surgeries" Tab for more information regarding the procedure.    Microbiology: No results found for this or any previous visit (from the past 240 hour(s)).   Labs: CBC: Recent Labs  Lab 02/04/20 0812  WBC 7.9  HGB 10.6*  HCT 31.0*  MCV 89.3  PLT 607*   Basic Metabolic Panel: Recent Labs  Lab 02/02/20 0727  NA 143  K 4.6  CL 108  CO2 26  GLUCOSE 116*  BUN 33*  CREATININE 1.77*  CALCIUM 9.5   Liver Function Tests: No results for input(s): AST, ALT, ALKPHOS, BILITOT, PROT, ALBUMIN in the last 168 hours. No results for input(s): LIPASE, AMYLASE in the last 168 hours. No results for input(s): AMMONIA in the last 168 hours. Cardiac Enzymes: No results for input(s): CKTOTAL, CKMB, CKMBINDEX, TROPONINI in the last 168 hours. BNP (last 3 results) No results for input(s): BNP in the last 8760 hours. CBG: No results for input(s): GLUCAP in the last 168 hours.  Time spent: 35 minutes  Signed:  Val Riles  [facility]  02/06/2020 12:16 PM

## 2020-02-06 NOTE — Progress Notes (Signed)
Patient transferred to hospice house via EMS> Vss. Belongings sent with patients Niece

## 2020-02-06 NOTE — Plan of Care (Signed)

## 2020-02-06 NOTE — Progress Notes (Addendum)
[facility]) Hospital Liaison RN note:   [facility] is able to offer a bed today. Spoke with niece, [name] via phone to provide update. [name] will be completing registration paperwork today at the [facility] at 1 pm. Transportation has been arranged for 4:30pm. I have faxed them the discharge summary.  Please call with any hospice related questions or concerns.  Thank you for the opportunity to participate in this patient's care.  Zandra Abts, RN [facility] Liaison [phone]

## 2020-02-06 NOTE — Progress Notes (Signed)
Report called to Santiago Glad at hospice house.

## 2020-02-25 ENCOUNTER — Telehealth: Payer: Self-pay

## 2020-02-25 NOTE — Telephone Encounter (Signed)
Called and lmom to schedule appt

## 2020-03-04 ENCOUNTER — Telehealth: Payer: Self-pay | Admitting: Cardiovascular Disease

## 2020-03-04 NOTE — Telephone Encounter (Signed)
     1. Has your device fired?   2. Is you device beeping?   3. Are you experiencing draining or swelling at device site?   4. Are you calling to see if we received your device transmission?   5. Have you passed out?   [name] called, pt passed away on 2020/03/15 and she would like to know if she needs to send back pt's device   Please route to [place]

## 2020-03-05 ENCOUNTER — Telehealth: Payer: Self-pay | Admitting: Podiatry

## 2020-03-05 NOTE — Telephone Encounter (Signed)
Family member of patient wanted you to know patient is now deceased

## 2020-03-05 NOTE — Telephone Encounter (Signed)
Spoke with [name], advised that she can discard remote monitor.

## 2020-03-06 NOTE — Telephone Encounter (Signed)
Thanks for letting me know!

## 2020-03-17 ENCOUNTER — Encounter: Payer: Medicare Other | Admitting: Cardiovascular Disease

## 2020-03-18 DEATH — deceased

## 2020-04-02 ENCOUNTER — Ambulatory Visit: Payer: Medicare Other | Admitting: Podiatry

## 2022-03-28 IMAGING — CR DG CHEST 2V
2 series · 2 of 2 positions shown · non-contrast
Comparison: December 23, 2019

CLINICAL DATA: Weakness.

EXAM:
CHEST - 2 VIEW

[chest lat]
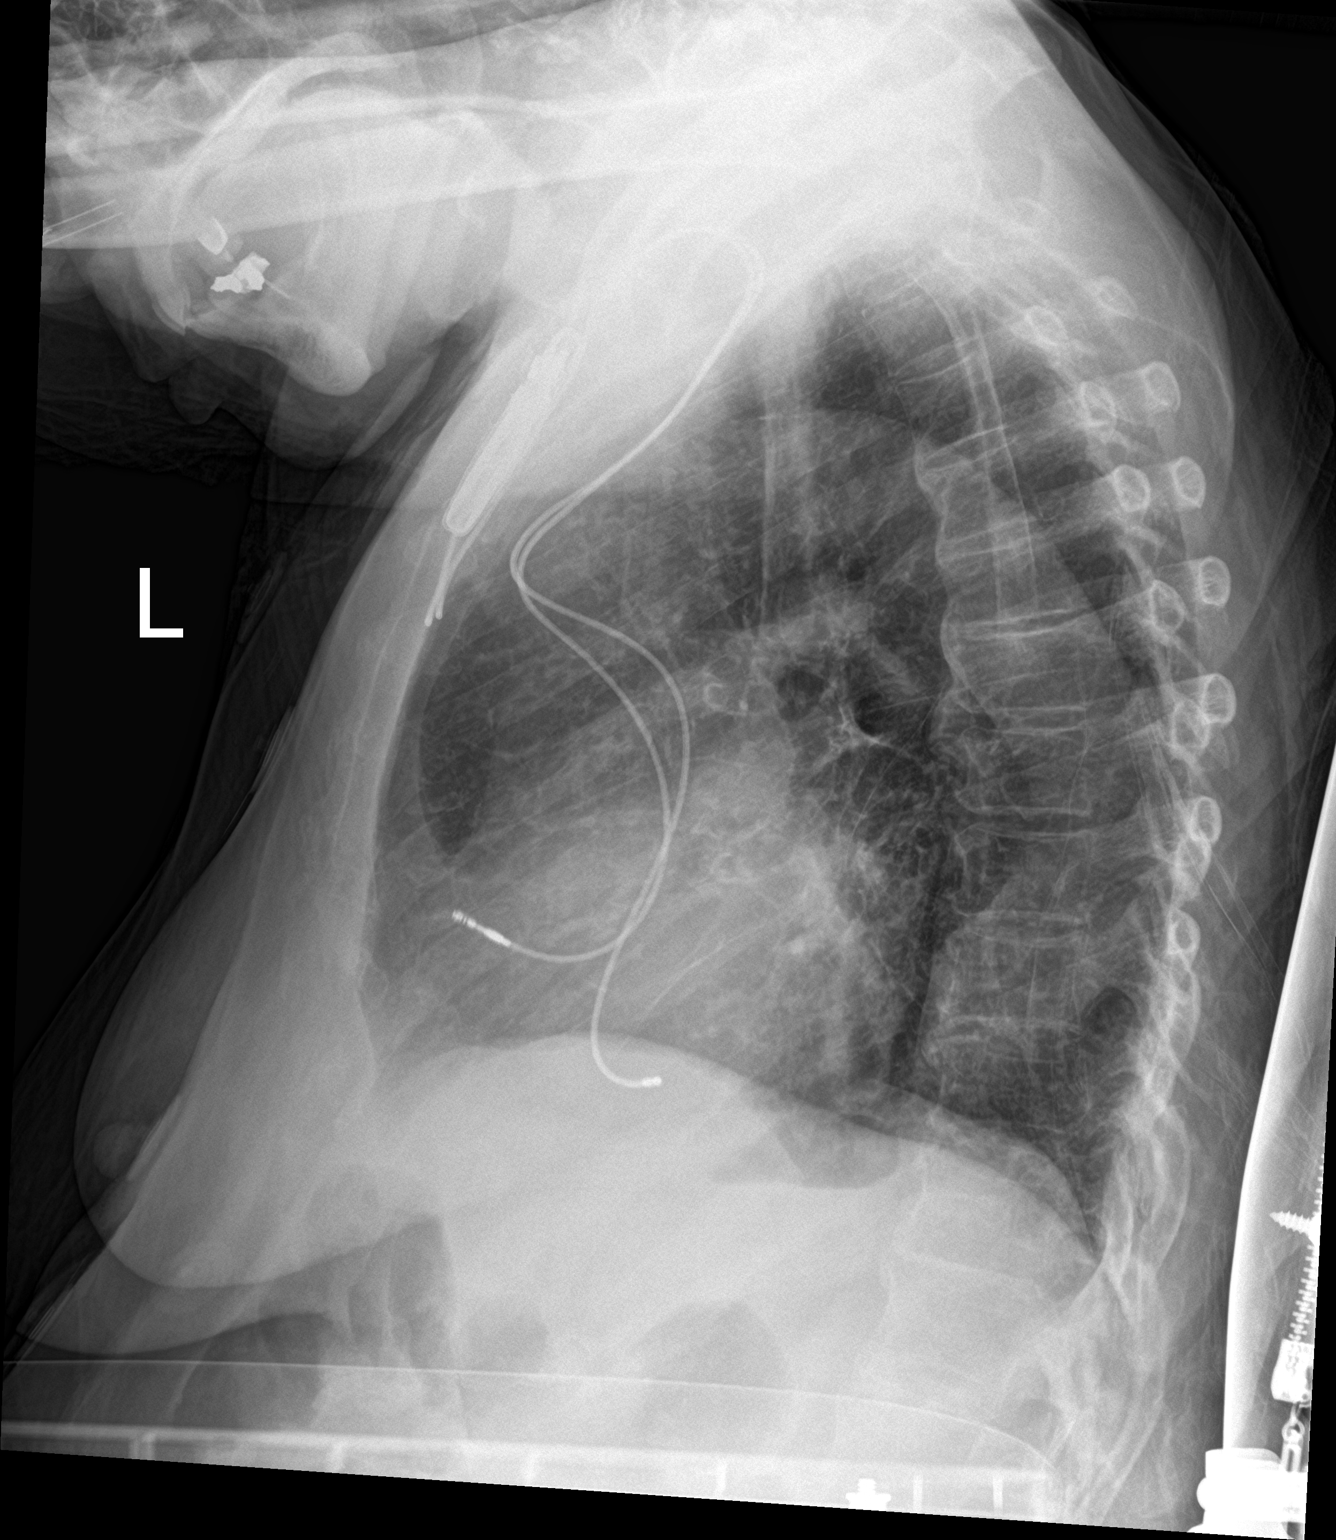

[chest ap]
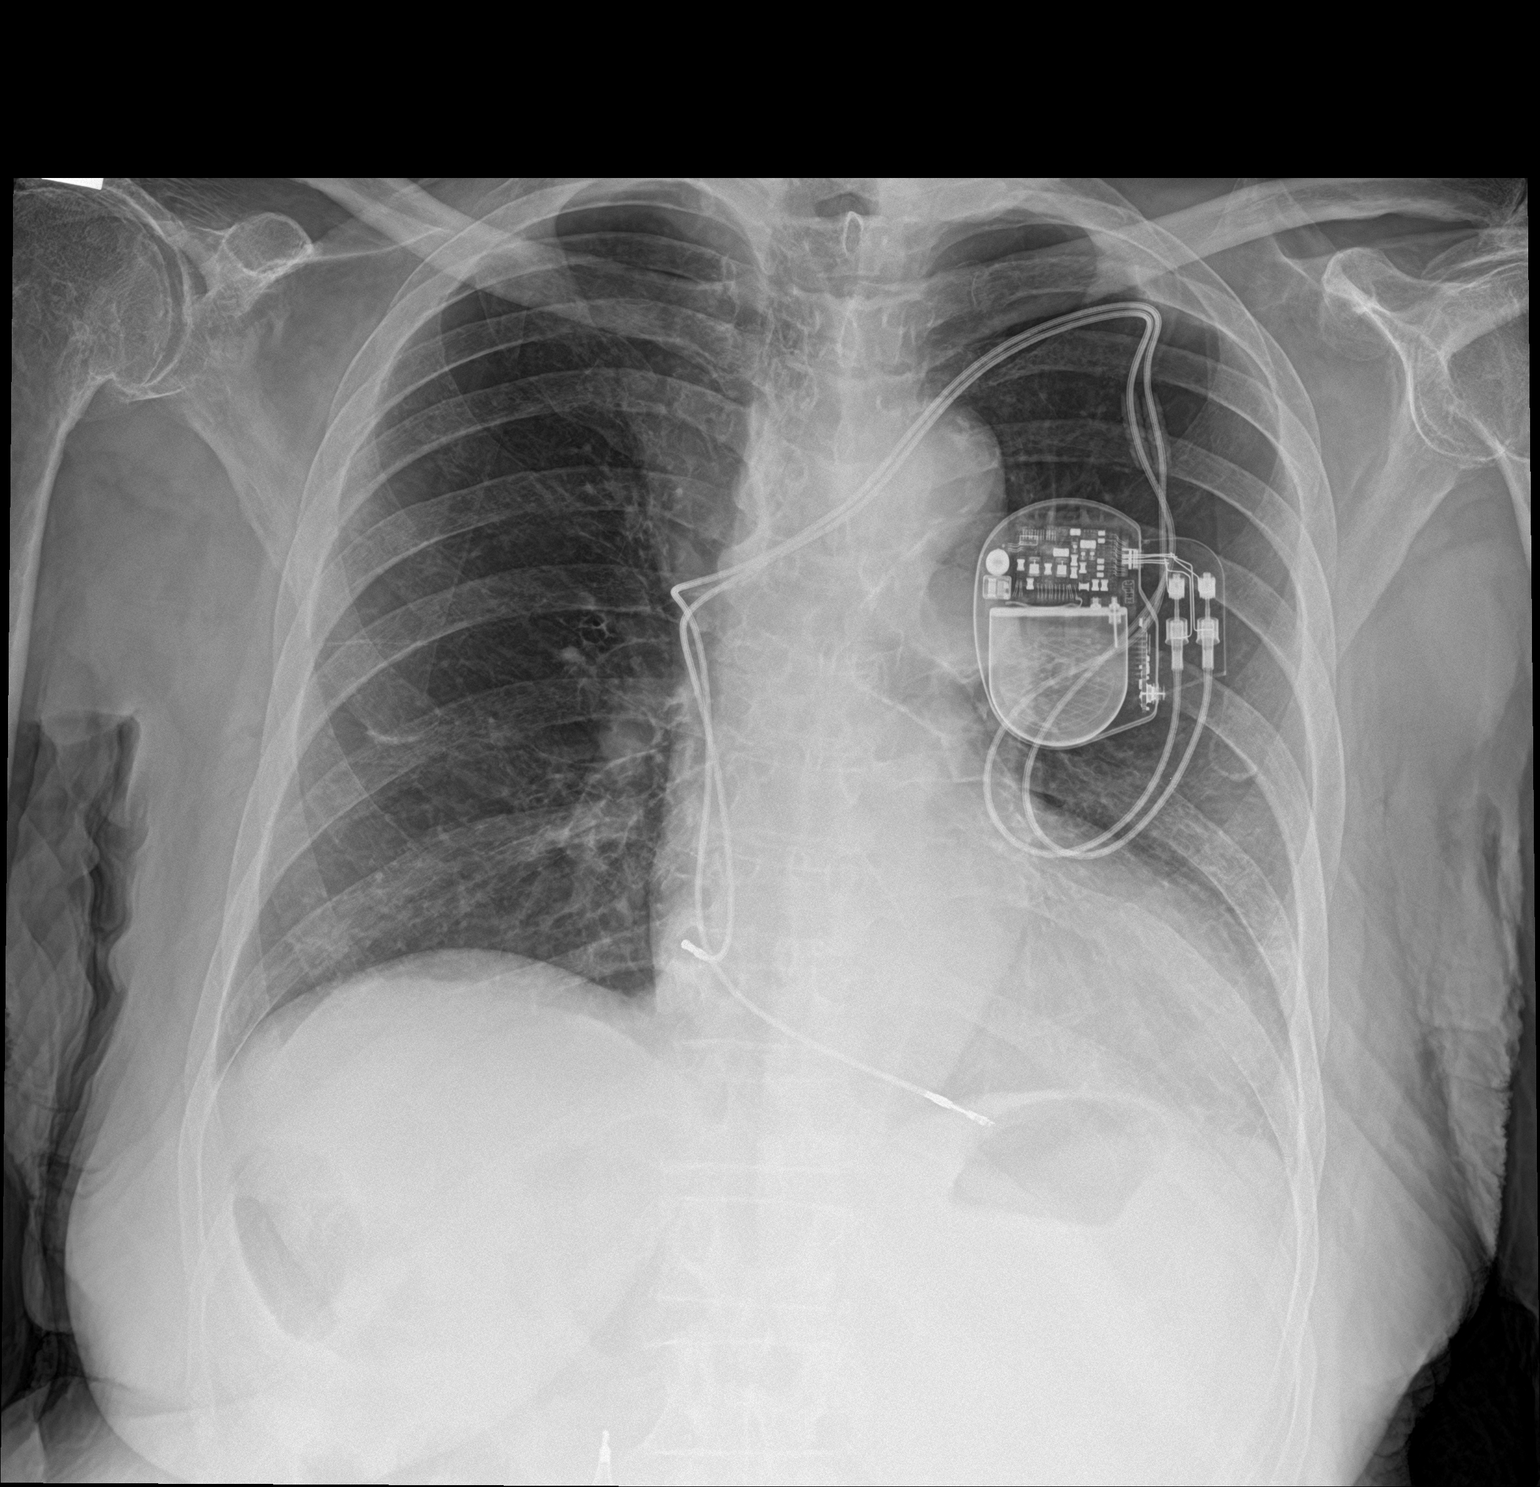

[2 of 2 positions shown; findings below may reference images not displayed]

FINDINGS: The heart size and mediastinal contours are within normal limits.
Both lungs are clear. The visualized skeletal structures are
unremarkable. There is a well-positioned dual chamber left-sided
pacemaker in place.
IMPRESSION: No active cardiopulmonary disease.

## 2022-03-29 IMAGING — CT CT ABD-PELV W/O CM
2 of 4 series · 15 of 46 positions shown, 17 images · non-contrast
Comparison: 10/07/2017

CLINICAL DATA: Bowel obstruction, vomiting, diarrhea

EXAM:
CT ABDOMEN AND PELVIS WITHOUT CONTRAST
TECHNIQUE: Multidetector CT imaging of the abdomen and pelvis was performed
following the standard protocol without IV contrast.

[Series 2: routine abd/pel wo · axial · 0.70mm/px · z∈[-378,+2]mm · 12 of 90 slices shown, 14 images]
[im 7/90  soft-tissue]
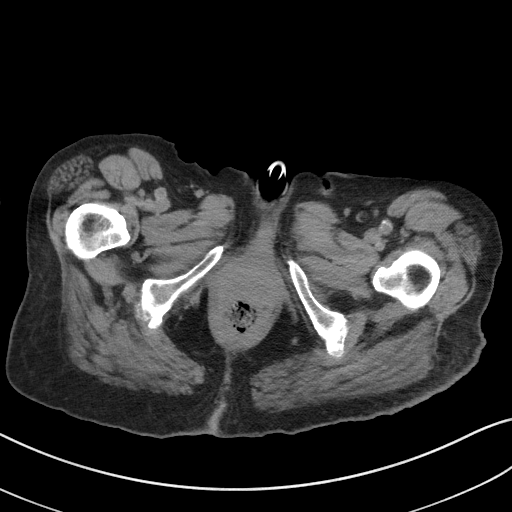
[im 7/90  bone]
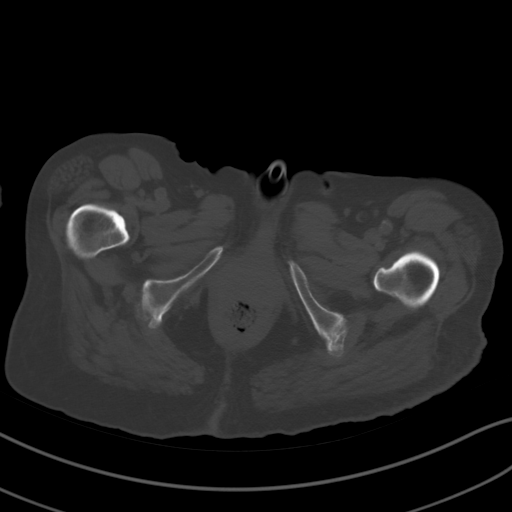
[im 14/90  soft-tissue]
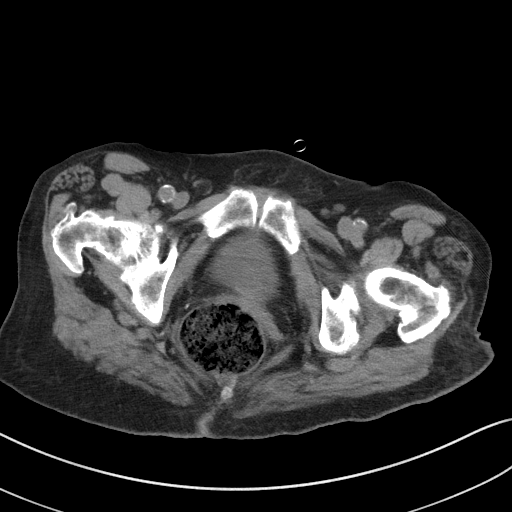
[im 20/90  soft-tissue]
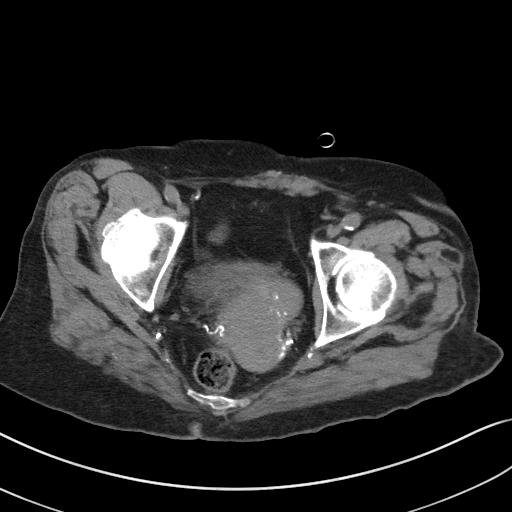
[im 27/90  soft-tissue]
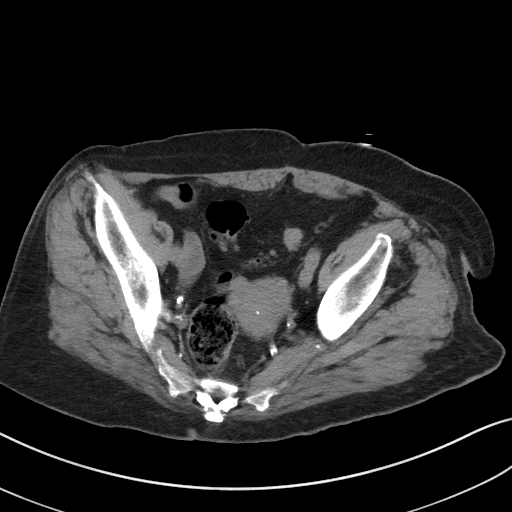
[im 33/90  soft-tissue]
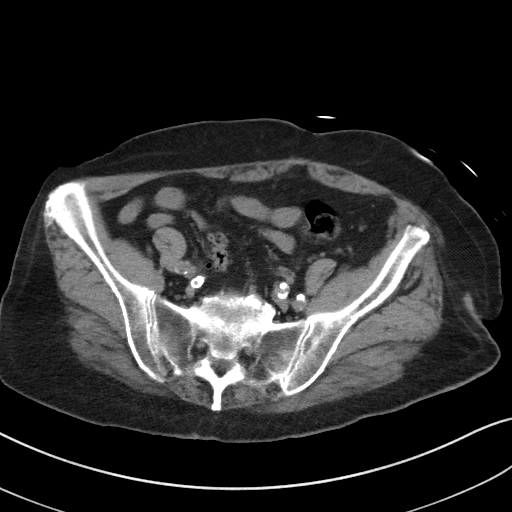
[im 40/90  soft-tissue]
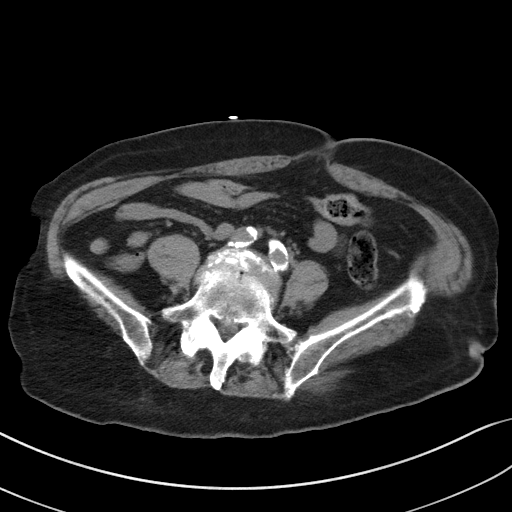
[im 50/90  soft-tissue]
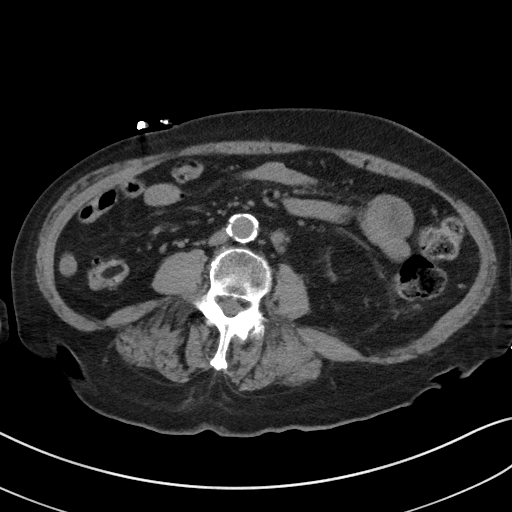
[im 57/90  soft-tissue]
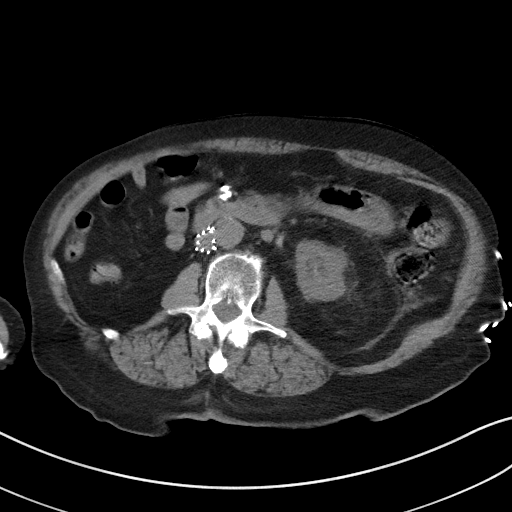
[im 63/90  soft-tissue]
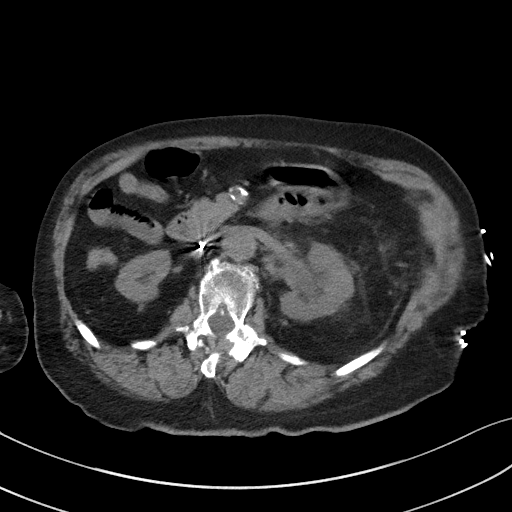
[im 63/90  bone]
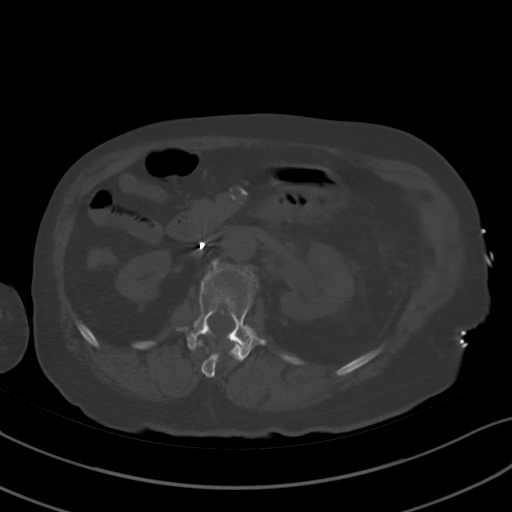
[im 70/90  soft-tissue]
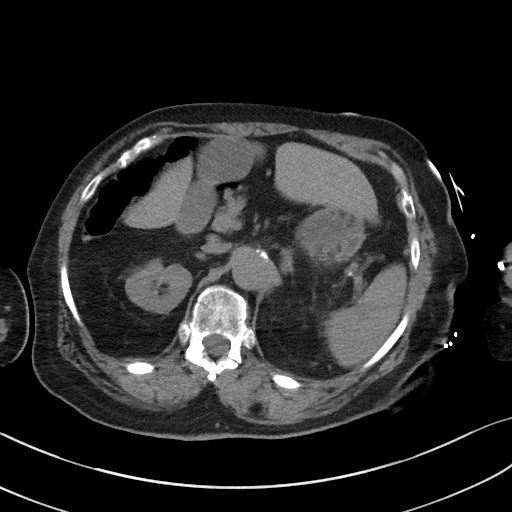
[im 76/90  soft-tissue]
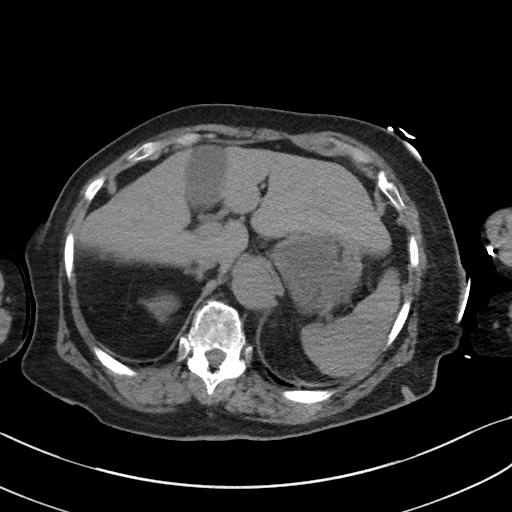
[im 83/90  soft-tissue]
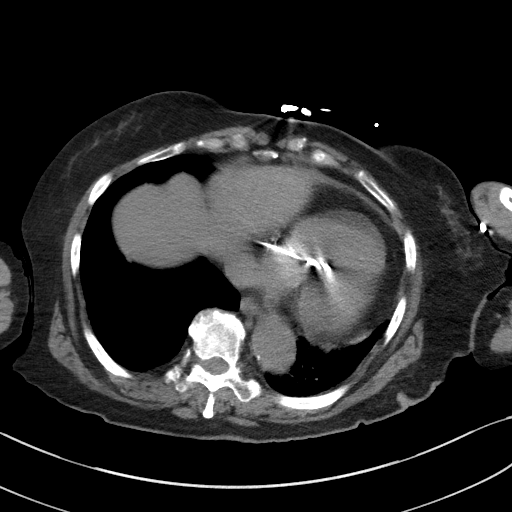

[Series 5: coronal st · coronal · 0.74mm/px · 3 of 77 slices shown]
[im 26/77  soft-tissue]
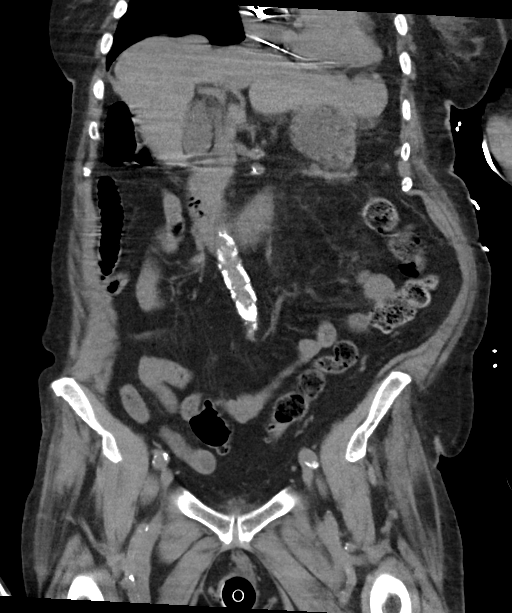
[im 34/77  soft-tissue]
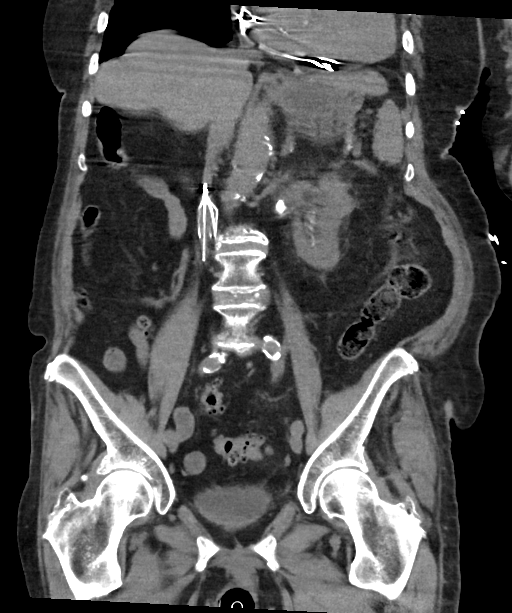
[im 43/77  soft-tissue]
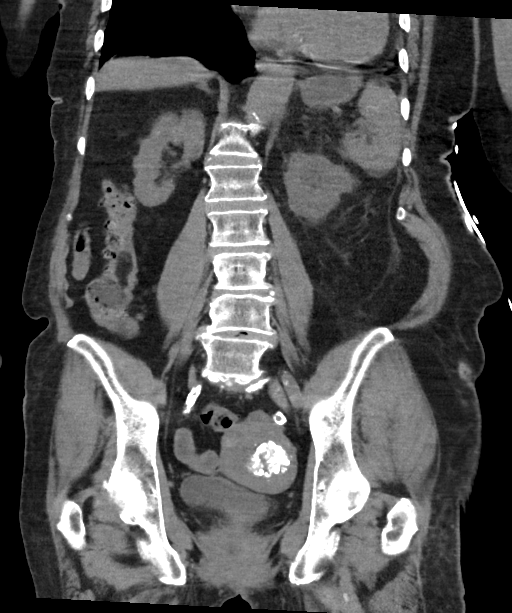

[15 of 46 positions shown; findings below may reference images not displayed]

FINDINGS: Lower chest: There is left-sided volume loss again identified with
mediastinal shift to the left. Mild global cardiomegaly. Pacemaker
leads are seen within the right atrium and within the right
ventricle embedded within the intraventricular septum. No
pericardial effusion.

Hepatobiliary: No focal liver abnormality is seen. No gallstones,
gallbladder wall thickening, or biliary dilatation.

Pancreas: Unremarkable

Spleen: Unremarkable

Adrenals/Urinary Tract: There has developed mild left hydronephrosis
and perinephric stranding secondary to an obstructing 8 mm x 15 mm x
15 mm calculus within the left ureteropelvic junction. An additional
10 mm staghorn calculus is seen within the terminal lower pole
calyx. Stable exophytic simple cortical cyst noted within the left
kidney. The right kidney is unremarkable; no intrarenal or ureteral
calculi are noted on the right and there is no hydronephrosis on the
right. The bladder is unremarkable.

Stomach/Bowel: Moderate sigmoid diverticulosis. Mild diverticulosis
of the cecum also noted. The stomach, small bowel, and large bowel
are otherwise unremarkable. No evidence of obstruction or focal
inflammation. The appendix is absent.

Vascular/Lymphatic: Extensive aortoiliac atherosclerotic
calcification. Extensive calcification noted within the mid and
distal superior mesenteric artery. No aortic aneurysm. Inferior vena
cava filter in expected position within the infrarenal cava. No
pathologic adenopathy within the abdomen and pelvis.

Reproductive: Large involuted densely calcified fibroid noted within
the uterine fundus. The endometrium is thickened, abnormal in a
patient of this age in absence of hormonal stimulation. No adnexal
masses are seen.

Other: Small fat containing umbilical hernia.

Musculoskeletal: Degenerative changes are seen within the lumbar
spine. No lytic or blastic bone lesion is seen.
IMPRESSION: Interval development of a mild left hydronephrosis and perinephric
stranding secondary to an obstructing 15 mm calculus within the left
ureteropelvic junction. Superimposed nonobstructing lower pole
nephrolithiasis.

Peripheral vascular disease with extensive calcification within the
a mid superior mesenteric artery. Patency of this vessel is not well
characterized on this examination. Correlation for signs and
symptoms of chronic mesenteric ischemia may be helpful.

Thickening of the endometrium, abnormal in a patient of this age in
absence of hormonal stimulation. Dedicated sonography and possible
endometrial biopsy may be helpful for further management.

Aortic Atherosclerosis ([id number]).
